# Patient Record
Sex: Female | Born: 1955 | Race: Black or African American | Hispanic: No | Smoking: Current every day smoker
Health system: Southern US, Community
[De-identification: ages and names within clinical notes are randomized; demographics above are authoritative.]

## PROBLEM LIST (undated history)

## (undated) DIAGNOSIS — F319 Bipolar disorder, unspecified: Secondary | ICD-10-CM

## (undated) DIAGNOSIS — N186 End stage renal disease: Secondary | ICD-10-CM

## (undated) DIAGNOSIS — I10 Essential (primary) hypertension: Secondary | ICD-10-CM

## (undated) DIAGNOSIS — E119 Type 2 diabetes mellitus without complications: Secondary | ICD-10-CM

## (undated) HISTORY — PX: TUBAL LIGATION: SHX77

---

## 2000-04-22 ENCOUNTER — Encounter: Payer: Self-pay | Admitting: Family Medicine

## 2000-04-22 ENCOUNTER — Encounter: Admission: RE | Admit: 2000-04-22 | Discharge: 2000-04-22 | Payer: Self-pay | Admitting: Family Medicine

## 2000-04-24 ENCOUNTER — Encounter: Payer: Self-pay | Admitting: Family Medicine

## 2000-04-24 ENCOUNTER — Encounter: Admission: RE | Admit: 2000-04-24 | Discharge: 2000-04-24 | Payer: Self-pay | Admitting: Family Medicine

## 2000-04-30 ENCOUNTER — Encounter: Admission: RE | Admit: 2000-04-30 | Discharge: 2000-04-30 | Payer: Self-pay | Admitting: Family Medicine

## 2000-04-30 ENCOUNTER — Encounter: Payer: Self-pay | Admitting: Family Medicine

## 2004-11-26 ENCOUNTER — Emergency Department (HOSPITAL_COMMUNITY): Admission: EM | Admit: 2004-11-26 | Discharge: 2004-11-26 | Payer: Self-pay | Admitting: Emergency Medicine

## 2005-01-16 ENCOUNTER — Encounter: Admission: RE | Admit: 2005-01-16 | Discharge: 2005-01-16 | Payer: Self-pay | Admitting: Cardiology

## 2005-01-31 ENCOUNTER — Encounter: Admission: RE | Admit: 2005-01-31 | Discharge: 2005-01-31 | Payer: Self-pay | Admitting: Cardiology

## 2007-06-28 ENCOUNTER — Inpatient Hospital Stay (HOSPITAL_COMMUNITY): Admission: EM | Admit: 2007-06-28 | Discharge: 2007-07-03 | Payer: Self-pay | Admitting: Emergency Medicine

## 2007-06-30 ENCOUNTER — Encounter: Payer: Self-pay | Admitting: Internal Medicine

## 2007-06-30 ENCOUNTER — Ambulatory Visit: Payer: Self-pay | Admitting: Vascular Surgery

## 2011-02-20 NOTE — H&P (Signed)
NAMEJODA, Jane Higgins              ACCOUNT NO.:  [id number]   MEDICAL RECORD NO.:  [id number]          PATIENT TYPE:  EMS   LOCATION:  ED                           FACILITY:  [facility]   PHYSICIAN:  Eric L. August Saucer, M.D.     DATE OF BIRTH:  09/20/1956   DATE OF ADMISSION:  06/27/2007  DATE OF DISCHARGE:                              HISTORY & PHYSICAL   CHIEF COMPLAINT:  Increasing lower extremity edema, leg pain, fever.   HISTORY OF PRESENT ILLNESS:  First recent [facility] admission  for this 55 year old married black female patient of Dr. Shana Chute with at  least a 1-year history of hypertension.  Long history of EtOH abuse.  She states she had been doing fairly well until 4 days prior to  admission when she spontaneously began to have increasing swelling in  her right leg greater than left.  She does not recall any prolonged  activity.  No trauma to her legs.  She noted progressive swelling and  discomfort of the legs over the subsequent days.  She denies chest pains  or shortness of breath.  Today her legs became more painful and of  greater concern.  She subsequently came to the emergency room for  further evaluation and was admitted thereafter.   PAST MEDICAL HISTORY:  Past medical history is notable for hypertension  for greater than a year.  History of tobacco abuse, 1-1/2 packs a day  for greater than 20 years.  History of ETOH abuse where she drinks a 6-  pack of beer on average of once a month.   ALLERGIES:  The patient states she is only allergic to questionably aloe  vera which causes her to break out in a rash.   MOST RECENT MEDICATIONS:  1. Tekturna 150 mg daily.  2. Risperdal, dose unknown.   REVIEW OF SYSTEMS:  As noted above.   PAST MEDICAL HISTORY:  Past medical history otherwise notable for  migraine headaches in the past.  Kidney stones.  Of note record  documents CVA which patient denies.   SOCIAL HISTORY:  The patient lives with her husband.   PHYSICAL EXAM:  GENERAL:  On physical exam, she is a well-developed,  well-nourished black female in no acute distress.  VITAL SIGNS:  Initially revealed a blood pressure of 177/88, a pulse of  120, respiratory rate 20, temperature 97.9 with O2 sat of 97%.  HEENT:  No sinus tenderness.  TM's clear.  Throat, posterior pharynx  clear.  NECK:  Supple.  No enlarged thyroid.  No carotid bruits appreciated.  No  posterior cervical nodes.  LUNGS:  Notable for patchy E to A change in  the left base versus right.  No rub or wheezes appreciated.  No CVA  tenderness.  CARDIOVASCULAR EXAM:  Normal S1-S2.  No S3.  No murmurs or rubs  appreciated.  ABDOMEN:  Abdomen with mild distention.  Bowel sounds are present.  Dullness in the lower quadrants, nontender.  MUSCULOSKELETAL EXAM:  Notable for the lower extremities.  She does have  trace pitting edema in the left lower extremity.  Her right leg,  however, she has 2+ edema.  Mild increased warmth in the foot extending  up to the right knee as well.  Sensation is intact.  NEUROLOGICALLY:  She is alert and oriented x3.  Cranial nerves were  intact.  The patient appeared anxious at this time.  States she has not  had a cigarette for 8 hours.   LABORATORY DATA:  CBC revealed WBC 15,700, hemoglobin 11.5, hematocrit  of 34.5.  Platelets 470,000.  There were 89% neutrophils, __________  lymphocytes.  Increased eosinophils noted.  D-dimer was mildly elevated  at 1.21.  Electrolytes sodium 135, potassium 3.6, chloride 98, CO2 27,  BUN 11, creatinine of 1.17.  Calcium 9.1.  CK-MB less than 1.0.  Myoglobin 144.  BNP was 48.7.  Urinalysis unremarkable except for 3+  protein.   Chest x-ray, no active cardiopulmonary disease.  EKG notable for a mild  sinus tachycardia with a question of old septal infarct.   IMPRESSION:  1. Lower extremity edema with fever.  Rule out cellulitis though      somewhat atypical.  Rule out deep venous thrombosis as well.  2. Rule  out early nephrotic syndrome.  The patient has significant      proteinuria.  A serum albumin is still pending at this time.  3. Hypertension, presently not well controlled.  She acknowledges some      dietary indiscretions with salt recently.  4. Tobacco abuse, chronic in nature.  5. Mild leukocytosis.  Rule out secondary to associated infection or      above.  6. History of kidney stones.   PLAN:  The patient admitted to telemetry for close observation.  Will  place the patient on Lovenox to cover for possible DVT.  She will be  placed on Rocephin IV as well.  She will need a venous Doppler in the  a.m.  Follow up on her other additional laboratory data.  Elevation of  her legs.  Pending results of the above, consider low-dose diuretic as  well.  Further therapy pending response to above.           ______________________________  Lind Guest. August Saucer, M.D.     ELD/MEDQ  D:  06/27/2007  T:  06/29/2007  Job:  [id number]

## 2011-02-23 NOTE — Discharge Summary (Signed)
NAMEKAYLIN, MARCON              ACCOUNT NO.:  [id number]   MEDICAL RECORD NO.:  [id number]          PATIENT TYPE:  INP   LOCATION:  1430                         FACILITY:  [facility]   PHYSICIAN:  Osvaldo Shipper. Spruill, M.D.DATE OF BIRTH:  [birth year]   DATE OF ADMISSION:  06/27/2007  DATE OF DISCHARGE:  07/03/2007                               DISCHARGE SUMMARY   ADMISSION/PROVISIONAL DIAGNOSES:  1. Right leg cellulitis.  2. Proteinuria.  3. Monoclonal gammopathy.  4. Alcohol abuse.  5. Gout.   DISCHARGE DIAGNOSES:  1. Cellulitis of leg.  2. Pyrexia of unknown origin.  3. Hypertension.  4. Leukocytosis.  5. Anxiety state.  6. Obesity.  7. Tobacco use disorder.  8. Alcohol abuse, continuous.   BRIEF HISTORY AND REASON FOR ADMISSION:  This 55 year old black woman  was admitted to the hospital in my absence by Dr. Minerva Areola L. August Saucer on  June 27, 2007.  At the time, the patient was complaining of  increasing lower extremity edema, leg pain and fever.  The patient came  to the emergency room at [facility] with a long history of alcohol  abuse, as well, and stated she had been doing fairly well until 4 days  prior to admission when she began to have increased swelling in her  right leg greater than the left.  She does not describe any antecedent  trauma to her leg.  As the pain in her legs grew worse, she subsequently  came to the hospital for further evaluation.  She was seen by Dr. Willey Blade in the emergency room and subsequently admitted to the hospital for  intensive therapy.   LABORATORY STUDIES:  The patient had an noninvasive vascular lab  peripheral venous study which revealed no evidence of deep venous  thrombosis, no superficial thrombosis.   Electrocardiogram revealed sinus tachycardia, septal infarction of  undetermined age.   Laboratory studies revealed urinalysis on June 27, 2007 - color was  yellow, cloudy, specific gravity 1.012.  The patient had trace  hemoglobin, greater than 300 mg of protein in the urine, trace leukocyte  esterase.  Myoglobin was 144.  CK-MB was less than 1.   The patient also had B. natruretic peptide which was 40.7.  Lipid  profile revealed a cholesterol of 123, HDL was 41, triglycerides 73, LDL  cholesterol was 67.  The patient had a serum protein electrophoresis  that revealed albumin was low at 44.1.  Alpha I globulin was 10, which  was elevated.  Alpha II globulin was 18.6, elevated.  Beta globulin was  low at 4.5.  Gamma globin M spike was 14.9, normal.  The routine  chemistry revealed total protein of 6.1, albumin 2.4, AST of 31, ALT of  40, alkaline phosphatase of 79, total bilirubin of 0.5.  The glomerular  filtration rate was 57.   Serum sodium was 135, potassium 3.6, chloride 98, CO2 content 27,  glucose 140, BUN 11, creatinine 1.17.   White blood cell count on admission was 15,700, had decreased to 12,800  hear the time of discharge.  Hemoglobin 11.5 on admission, 11.2 at  discharge.  Platelet count 470,000 on admission, 607,000 at discharge.  D-dimer was 1.21.   HOSPITAL COURSE:  This 55 year old white woman was admitted to the  hospital.  She had baseline laboratory studies obtained which were given  in the body of the chart.  She was placed on telemetry by Dr. August Saucer, and  the diagnosis at the time was rule out DVT with cellulitis.   The patient's vital signs were obtained every 4 hours.  She was at  bedrest.  She was given Lovenox 40 mg subcutaneous daily per protocol.  She was also placed on Rocephin 1 gram IV daily, and diet was 4 gram  sodium restricted diet.  She was given a nicotine patch to help with her  smoking cessation.   The patient was continued on Tekturna 150 mg p.o. daily, Risperdal 1 mg  p.o. q.h.s., Xanax 0.25 mg as needed for anxiety.   She was started also on spironolactone 25 mg p.o. b.i.d., and a 24-hour  urine collection for protein and creatinine clearance was obtained  and  SPET for electrophoresis was also obtained at the time.   The patient began to show some improvement while on antibiotic with less  reddening of her lower extremity and eventually she was placed on p.o.  medications.  Other studies were obtained to evaluate the patient's  protein problem, and are given in the body of the chart.  She  essentially responded to IV antibiotics for her cellulitis and had no  was significant difficulty after this.   She was also placed on Avapro 300 mg per mouth daily, and her IV  Rocephin had been stopped at some point prior to being placed on Ceftin  500 mg twice a day.  She appeared to respond to this therapy.   The patient had improved with her cellulitis and no evidence of any  additional fever.  Her white blood cell count was decreasing, and it was  felt that she could be released home for further office follow-up in  approximately 1-2 weeks.   DISCHARGE MEDICATIONS:  1. Xanax 0.25 mg by mouth three times a day.  2. Tekturna 150 mg p.o. q.a.m.  3. Risperdal 1 mg q.h.s.  4. Aldactone 25 mg p.o. b.i.d.  5. Ceftin 500 mg p.o. b.i.d.  6. Avapro 300 mg by mouth daily.  7. She will be seen in the office for additional follow up.   DISCHARGE DIET:  A low-sodium diet indication.      Osvaldo Shipper. Spruill, M.D.  Electronically Signed     JOS/MEDQ  D:  08/27/2007  T:  08/28/2007  Job:  [id number]

## 2011-07-19 LAB — PROTEIN, URINE, 24 HOUR: Urine Total Volume-UPROT: 1800

## 2011-07-19 LAB — URINALYSIS, ROUTINE W REFLEX MICROSCOPIC
Specific Gravity, Urine: 1.012
Urobilinogen, UA: 0.2
pH: 7

## 2011-07-19 LAB — COMPREHENSIVE METABOLIC PANEL
AST: 106 — ABNORMAL HIGH
AST: 31
Albumin: 2.1 — ABNORMAL LOW
Albumin: 2.4 — ABNORMAL LOW
Alkaline Phosphatase: 94
BUN: 10
BUN: 7
Calcium: 9
Chloride: 104
Chloride: 99
Creatinine, Ser: 1.02
GFR calc Af Amer: >60
GFR calc Af Amer: >60
Potassium: 3.6
Sodium: 138
Total Protein: 5.5 — ABNORMAL LOW
Total Protein: 6.1

## 2011-07-19 LAB — DIFFERENTIAL
Basophils Absolute: 0
Basophils Relative: 1
Eosinophils Relative: 0
Eosinophils Relative: 1
Lymphocytes Relative: 13
Lymphocytes Relative: 6 — ABNORMAL LOW
Lymphs Abs: 0.9
Monocytes Absolute: 0.8 — ABNORMAL HIGH
Monocytes Absolute: 0.9 — ABNORMAL HIGH
Monocytes Relative: 8
Neutro Abs: 14 — ABNORMAL HIGH
Neutro Abs: 8.9 — ABNORMAL HIGH

## 2011-07-19 LAB — BASIC METABOLIC PANEL
BUN: 12
CO2: 28
Calcium: 9.1
GFR calc Af Amer: 59 — ABNORMAL LOW
GFR calc non Af Amer: 48 — ABNORMAL LOW
Glucose, Bld: 148 — ABNORMAL HIGH
Potassium: 3.6
Potassium: 4.3
Sodium: 135
Sodium: 137

## 2011-07-19 LAB — D-DIMER, QUANTITATIVE: D-Dimer, Quant: 1.21 — ABNORMAL HIGH

## 2011-07-19 LAB — CREATININE CLEARANCE, URINE, 24 HOUR
Collection Interval-CRCL: 24
Creatinine Clearance: 75
Creatinine, 24H Ur: 1100
Creatinine: 1.02
Urine Total Volume-CRCL: 1800

## 2011-07-19 LAB — CBC
HCT: 29.9 — ABNORMAL LOW
HCT: 33.5 — ABNORMAL LOW
HCT: 34.5 — ABNORMAL LOW
Hemoglobin: 11.2 — ABNORMAL LOW
Hemoglobin: 11.5 — ABNORMAL LOW
MCHC: 33.4
Platelets: 446 — ABNORMAL HIGH
RDW: 14.6 — ABNORMAL HIGH
RDW: 14.9 — ABNORMAL HIGH
WBC: 11.7 — ABNORMAL HIGH
WBC: 15.7 — ABNORMAL HIGH

## 2011-07-19 LAB — URINE MICROSCOPIC-ADD ON

## 2011-07-19 LAB — PROTEIN ELECTROPH W RFLX QUANT IMMUNOGLOBULINS
Albumin ELP: 44.1 — ABNORMAL LOW
Beta 2: 7.9 — ABNORMAL HIGH
Beta Globulin: 4.5 — ABNORMAL LOW
M-Spike, %: NOT DETECTED

## 2011-07-19 LAB — POCT CARDIAC MARKERS
CKMB, poc: <1 — ABNORMAL LOW
Myoglobin, poc: 144
Troponin i, poc: <0.05

## 2011-07-19 LAB — LIPID PANEL
HDL: 41
LDL Cholesterol: 67
Total CHOL/HDL Ratio: 3
Triglycerides: 73
VLDL: 15

## 2011-07-19 LAB — PROTEIN, TOTAL: Total Protein: 6.4

## 2011-07-19 LAB — B-NATRIURETIC PEPTIDE (CONVERTED LAB): Pro B Natriuretic peptide (BNP): 48.7

## 2013-04-12 ENCOUNTER — Encounter (HOSPITAL_COMMUNITY): Payer: Self-pay | Admitting: Emergency Medicine

## 2013-04-12 ENCOUNTER — Inpatient Hospital Stay (HOSPITAL_COMMUNITY)
Admission: EM | Admit: 2013-04-12 | Discharge: 2013-04-16 | DRG: 100 | Disposition: A | Discharge status: Home Health | Payer: Medicare Other | Attending: Internal Medicine | Admitting: Internal Medicine

## 2013-04-12 ENCOUNTER — Emergency Department (HOSPITAL_COMMUNITY): Payer: Medicare Other

## 2013-04-12 DIAGNOSIS — E119 Type 2 diabetes mellitus without complications: Secondary | ICD-10-CM | POA: Diagnosis present

## 2013-04-12 DIAGNOSIS — F203 Undifferentiated schizophrenia: Secondary | ICD-10-CM

## 2013-04-12 DIAGNOSIS — I160 Hypertensive urgency: Secondary | ICD-10-CM | POA: Diagnosis present

## 2013-04-12 DIAGNOSIS — E876 Hypokalemia: Secondary | ICD-10-CM | POA: Diagnosis present

## 2013-04-12 DIAGNOSIS — R739 Hyperglycemia, unspecified: Secondary | ICD-10-CM | POA: Diagnosis present

## 2013-04-12 DIAGNOSIS — G40401 Other generalized epilepsy and epileptic syndromes, not intractable, with status epilepticus: Principal | ICD-10-CM | POA: Diagnosis present

## 2013-04-12 DIAGNOSIS — F319 Bipolar disorder, unspecified: Secondary | ICD-10-CM | POA: Diagnosis present

## 2013-04-12 DIAGNOSIS — I169 Hypertensive crisis, unspecified: Secondary | ICD-10-CM

## 2013-04-12 DIAGNOSIS — J9600 Acute respiratory failure, unspecified whether with hypoxia or hypercapnia: Secondary | ICD-10-CM | POA: Diagnosis present

## 2013-04-12 DIAGNOSIS — R7309 Other abnormal glucose: Secondary | ICD-10-CM

## 2013-04-12 DIAGNOSIS — G40901 Epilepsy, unspecified, not intractable, with status epilepticus: Secondary | ICD-10-CM

## 2013-04-12 DIAGNOSIS — R569 Unspecified convulsions: Secondary | ICD-10-CM | POA: Diagnosis present

## 2013-04-12 DIAGNOSIS — Z91199 Patient's noncompliance with other medical treatment and regimen due to unspecified reason: Secondary | ICD-10-CM

## 2013-04-12 DIAGNOSIS — N179 Acute kidney failure, unspecified: Secondary | ICD-10-CM | POA: Diagnosis present

## 2013-04-12 DIAGNOSIS — R1110 Vomiting, unspecified: Secondary | ICD-10-CM | POA: Diagnosis present

## 2013-04-12 DIAGNOSIS — D696 Thrombocytopenia, unspecified: Secondary | ICD-10-CM | POA: Diagnosis present

## 2013-04-12 DIAGNOSIS — E1101 Type 2 diabetes mellitus with hyperosmolarity with coma: Secondary | ICD-10-CM

## 2013-04-12 DIAGNOSIS — F209 Schizophrenia, unspecified: Secondary | ICD-10-CM | POA: Diagnosis present

## 2013-04-12 DIAGNOSIS — I674 Hypertensive encephalopathy: Secondary | ICD-10-CM | POA: Diagnosis present

## 2013-04-12 DIAGNOSIS — E870 Hyperosmolality and hypernatremia: Secondary | ICD-10-CM | POA: Diagnosis present

## 2013-04-12 DIAGNOSIS — Z9119 Patient's noncompliance with other medical treatment and regimen: Secondary | ICD-10-CM

## 2013-04-12 DIAGNOSIS — F17200 Nicotine dependence, unspecified, uncomplicated: Secondary | ICD-10-CM | POA: Diagnosis present

## 2013-04-12 DIAGNOSIS — I10 Essential (primary) hypertension: Secondary | ICD-10-CM | POA: Diagnosis present

## 2013-04-12 HISTORY — DX: Bipolar disorder, unspecified: F31.9

## 2013-04-12 LAB — COMPREHENSIVE METABOLIC PANEL
Albumin: 3.5 g/dL (ref 3.5–5.2)
BUN: 20 mg/dL (ref 6–23)
Chloride: 95 mEq/L — ABNORMAL LOW (ref 96–112)
Creatinine, Ser: 1.66 mg/dL — ABNORMAL HIGH (ref 0.50–1.10)
Total Bilirubin: 0.3 mg/dL (ref 0.3–1.2)

## 2013-04-12 LAB — GLUCOSE, CAPILLARY: Glucose-Capillary: 579 mg/dL (ref 70–99)

## 2013-04-12 LAB — CBC
HCT: 54.6 % — ABNORMAL HIGH (ref 36.0–46.0)
MCH: 26 pg (ref 26.0–34.0)
MCHC: 33.2 g/dL (ref 30.0–36.0)
MCV: 78.6 fL (ref 78.0–100.0)
RDW: 14.8 % (ref 11.5–15.5)

## 2013-04-12 LAB — ETHANOL: Alcohol, Ethyl (B): <11 mg/dL (ref 0–11)

## 2013-04-12 LAB — POCT I-STAT 3, ART BLOOD GAS (G3+)
Acid-base deficit: 2 mmol/L (ref 0.0–2.0)
Bicarbonate: 23.3 mEq/L (ref 20.0–24.0)
Patient temperature: 98.7
TCO2: 25 mmol/L (ref 0–100)
pO2, Arterial: 333 mmHg — ABNORMAL HIGH (ref 80.0–100.0)

## 2013-04-12 LAB — URINALYSIS, ROUTINE W REFLEX MICROSCOPIC
Glucose, UA: >1000 mg/dL — AB
Specific Gravity, Urine: 1.02 (ref 1.005–1.030)
pH: 6.5 (ref 5.0–8.0)

## 2013-04-12 LAB — POCT I-STAT TROPONIN I: Troponin i, poc: 0 ng/mL (ref 0.00–0.08)

## 2013-04-12 LAB — DIFFERENTIAL
Basophils Absolute: 0.1 10*3/uL (ref 0.0–0.1)
Eosinophils Absolute: 0.1 10*3/uL (ref 0.0–0.7)
Lymphs Abs: 1.8 10*3/uL (ref 0.7–4.0)
Monocytes Relative: 5 % (ref 3–12)
Neutro Abs: 3.6 10*3/uL (ref 1.7–7.7)

## 2013-04-12 LAB — PROTIME-INR: Prothrombin Time: 12.9 seconds (ref 11.6–15.2)

## 2013-04-12 LAB — POCT I-STAT, CHEM 8
HCT: 40 % (ref 36.0–46.0)
Hemoglobin: 13.6 g/dL (ref 12.0–15.0)
Potassium: 3.4 mEq/L — ABNORMAL LOW (ref 3.5–5.1)
Sodium: 132 mEq/L — ABNORMAL LOW (ref 135–145)
TCO2: 25 mmol/L (ref 0–100)

## 2013-04-12 LAB — RAPID URINE DRUG SCREEN, HOSP PERFORMED
Amphetamines: NOT DETECTED
Opiates: NOT DETECTED

## 2013-04-12 LAB — URINE MICROSCOPIC-ADD ON

## 2013-04-12 LAB — TROPONIN I: Troponin I: <0.3 ng/mL (ref <0.30)

## 2013-04-12 LAB — APTT: aPTT: 26 seconds (ref 24–37)

## 2013-04-12 MED ORDER — INSULIN REGULAR HUMAN 100 UNIT/ML IJ SOLN
INTRAMUSCULAR | Status: DC
  Administered 2013-04-12: 9.5 [IU]/h via INTRAVENOUS
  Administered 2013-04-12: 5.4 [IU]/h via INTRAVENOUS
  Administered 2013-04-12: 14.4 [IU]/h via INTRAVENOUS
  Filled 2013-04-12 (×2): qty 1

## 2013-04-12 MED ORDER — LORAZEPAM 2 MG/ML IJ SOLN
1.0000 mg | INTRAMUSCULAR | Status: DC | PRN
  Filled 2013-04-12: qty 1

## 2013-04-12 MED ORDER — SODIUM CHLORIDE 0.9 % IV SOLN
3.0000 g | Freq: Four times a day (QID) | INTRAVENOUS | Status: DC
  Administered 2013-04-12 – 2013-04-13 (×3): 3 g via INTRAVENOUS
  Filled 2013-04-12 (×5): qty 3

## 2013-04-12 MED ORDER — ROCURONIUM BROMIDE 50 MG/5ML IV SOLN
INTRAVENOUS | Status: AC
  Filled 2013-04-12: qty 2

## 2013-04-12 MED ORDER — LIDOCAINE HCL (CARDIAC) 20 MG/ML IV SOLN
INTRAVENOUS | Status: AC
  Filled 2013-04-12: qty 5

## 2013-04-12 MED ORDER — PROPOFOL 10 MG/ML IV EMUL
5.0000 ug/kg/min | Freq: Once | INTRAVENOUS | Status: AC
  Administered 2013-04-12: 40 ug/kg/min via INTRAVENOUS

## 2013-04-12 MED ORDER — NICARDIPINE HCL IN NACL 20-0.86 MG/200ML-% IV SOLN: 5.0000 mg/h | Freq: Once | INTRAVENOUS | Status: DC

## 2013-04-12 MED ORDER — SODIUM CHLORIDE 0.9 % IV SOLN: INTRAVENOUS | Status: DC

## 2013-04-12 MED ORDER — SODIUM CHLORIDE 0.9 % IV SOLN: 75.0000 mL/h | INTRAVENOUS | Status: DC

## 2013-04-12 MED ORDER — SODIUM CHLORIDE 0.9 % IV SOLN
INTRAVENOUS | Status: DC
  Administered 2013-04-12: 20:00:00 via INTRAVENOUS

## 2013-04-12 MED ORDER — LABETALOL HCL 5 MG/ML IV SOLN
INTRAVENOUS | Status: AC
  Administered 2013-04-12: 20 mg
  Filled 2013-04-12: qty 4

## 2013-04-12 MED ORDER — SODIUM CHLORIDE 0.9 % IV BOLUS (SEPSIS)
1000.0000 mL | Freq: Once | INTRAVENOUS | Status: AC
  Administered 2013-04-12: 1000 mL via INTRAVENOUS

## 2013-04-12 MED ORDER — SODIUM CHLORIDE 0.9 % IV SOLN: 250.0000 mL | INTRAVENOUS | Status: DC | PRN

## 2013-04-12 MED ORDER — CHLORHEXIDINE GLUCONATE 0.12 % MT SOLN
OROMUCOSAL | Status: AC
  Administered 2013-04-12: 15 mL
  Filled 2013-04-12: qty 15

## 2013-04-12 MED ORDER — SODIUM CHLORIDE 0.9 % IV SOLN
1000.0000 mg | Freq: Once | INTRAVENOUS | Status: AC
  Administered 2013-04-12: 1000 mg via INTRAVENOUS
  Filled 2013-04-12: qty 10

## 2013-04-12 MED ORDER — LEVETIRACETAM 500 MG/5ML IV SOLN
500.0000 mg | Freq: Two times a day (BID) | INTRAVENOUS | Status: DC
  Administered 2013-04-12 – 2013-04-15 (×6): 500 mg via INTRAVENOUS
  Filled 2013-04-12 (×9): qty 5

## 2013-04-12 MED ORDER — PROPOFOL 10 MG/ML IV EMUL
INTRAVENOUS | Status: AC
  Administered 2013-04-12: 1000 mg
  Filled 2013-04-12: qty 100

## 2013-04-12 MED ORDER — SUCCINYLCHOLINE CHLORIDE 20 MG/ML IJ SOLN
INTRAMUSCULAR | Status: AC
  Administered 2013-04-12: 120 mg
  Filled 2013-04-12: qty 1

## 2013-04-12 MED ORDER — ETOMIDATE 2 MG/ML IV SOLN
INTRAVENOUS | Status: AC
  Administered 2013-04-12: 30 mg
  Filled 2013-04-12: qty 20

## 2013-04-12 MED ORDER — POTASSIUM CHLORIDE 10 MEQ/100ML IV SOLN
10.0000 meq | Freq: Once | INTRAVENOUS | Status: AC
  Administered 2013-04-12: 10 meq via INTRAVENOUS
  Filled 2013-04-12: qty 100

## 2013-04-12 MED ORDER — VITAL AF 1.2 CAL PO LIQD
1000.0000 mL | ORAL | Status: DC
  Filled 2013-04-12 (×3): qty 1000

## 2013-04-12 MED ORDER — PANTOPRAZOLE SODIUM 40 MG IV SOLR
40.0000 mg | Freq: Every day | INTRAVENOUS | Status: DC
  Administered 2013-04-12: 40 mg via INTRAVENOUS
  Filled 2013-04-12 (×2): qty 40

## 2013-04-12 MED ORDER — PROPOFOL 10 MG/ML IV EMUL
5.0000 ug/kg/min | INTRAVENOUS | Status: DC
  Administered 2013-04-12: 50 ug/kg/min via INTRAVENOUS
  Administered 2013-04-13: 40 ug/kg/min via INTRAVENOUS
  Filled 2013-04-12 (×3): qty 100

## 2013-04-12 MED ORDER — PNEUMOCOCCAL VAC POLYVALENT 25 MCG/0.5ML IJ INJ
0.5000 mL | INJECTION | INTRAMUSCULAR | Status: AC
  Administered 2013-04-13: 0.5 mL via INTRAMUSCULAR
  Filled 2013-04-12: qty 0.5

## 2013-04-12 MED ORDER — NICARDIPINE HCL IN NACL 20-0.86 MG/200ML-% IV SOLN
5.0000 mg/h | Freq: Once | INTRAVENOUS | Status: AC
  Administered 2013-04-12: 5 mg/h via INTRAVENOUS

## 2013-04-12 MED ORDER — SODIUM CHLORIDE 0.9 % IV BOLUS (SEPSIS)
2000.0000 mL | Freq: Once | INTRAVENOUS | Status: AC
  Administered 2013-04-12: 2000 mL via INTRAVENOUS

## 2013-04-12 NOTE — Consult Note (Signed)
NEURO HOSPITALIST CONSULT NOTE    Reason for Consult: New-onset generalized seizures.  HPI:                                                                                                                                          Jane Higgins is an 57 y.o. female with a history of bipolar affective disorder who was brought to the emergency room with generalized seizure activity. Patient had generalized seizure activity in route to the hospital as well as after arriving in the emergency room. She was last seen well at 1:30 today. She was noted by family members to lose consciousness and exhibited generalized seizure activity. Eyes were deviated to the left side. There is no previous history of seizure activity. Blood pressure was markedly elevated at 250 01/06/1944. She was given labetalol 20 mg IV but subsequently required Cardene drip for blood pressure control. She was intubated in the emergency room for airway protection and sedated with propofol drip. She was also given 1 g of Keppra IV for seizure prevention. CT scan of her head showed no acute intracranial abnormality. I-STAT blood sugar was greater than 700. Potassium was slightly low at 3.4 and serum sodium was 132. She was started on intravenous fluids and potassium replacement prior to being given IV insulin. She has no documented history of hypertension nor diabetes mellitus. Family members indicate less than ideal compliance with taking medication for bipolar disorder.  Past Medical History  Diagnosis Date  . Bipolar 1 disorder     History reviewed. No pertinent past surgical history.  Family history: Positive for diabetes mellitus involving several brothers.   Social History: Smokes at least one pack of cigarettes per day.  No Known Allergies  MEDICATIONS:                                                                                                                     I have reviewed the patient's  current medications. Prior to Admission:  Unavailable.  ROS:  History obtained from child.  General ROS: negative for - chills, fatigue, fever, night sweats, weight gain or weight loss Psychological ROS: negative for - behavioral disorder, hallucinations, memory difficulties, mood swings or suicidal ideation Ophthalmic ROS: negative for - blurry vision, double vision, eye pain or loss of vision ENT ROS: negative for - epistaxis, nasal discharge, oral lesions, sore throat, tinnitus or vertigo Allergy and Immunology ROS: negative for - hives or itchy/watery eyes Hematological and Lymphatic ROS: negative for - bleeding problems, bruising or swollen lymph nodes Endocrine ROS: negative for - galactorrhea, hair pattern changes, polydipsia/polyuria or temperature intolerance Respiratory ROS: negative for - cough, hemoptysis, shortness of breath or wheezing Cardiovascular ROS: negative for - chest pain, dyspnea on exertion, edema or irregular heartbeat Gastrointestinal ROS: negative for - abdominal pain, diarrhea, hematemesis, nausea/vomiting or stool incontinence Genito-Urinary ROS: Positive for urinary frequency/urgency Musculoskeletal ROS: negative for - joint swelling or muscular weakness Neurological ROS: as noted in HPI Dermatological ROS: negative for rash and skin lesion changes   Blood pressure 126/108, pulse 79, temperature 97.2 F (36.2 C), resp. rate 16, height 5\' 6"  (1.676 m), weight 66.999 kg (147 lb 11.3 oz), SpO2 100.00%.   Neurologic Examination:                                                                                                      Patient was intubated and on mechanical ventilation. She had spontaneous respirations as well. She was sedated with propofol. Pupils were equal and reacted normally to light. Extraocular movements were  intact with oculocephalic maneuvers. She had no gaze preference. Face was symmetrical with no indication of focal weakness. Patient moved extremities equally in response to noxious stimuli. Muscle tone was normal throughout. Deep tendon reflexes were trace to 1+ and symmetrical. Plantar responses were flexor bilaterally.  Lab Results  Component Value Date/Time   CHOL  Value: 123        ATP III CLASSIFICATION:  <200     mg/dL   Desirable  562-130  mg/dL   Borderline High  >=865    mg/dL   High 7/84/6962  9:52 AM    Results for orders placed during the hospital encounter of 04/12/13 (from the past 48 hour(s))  PROTIME-INR     Status: None   Collection Time    04/12/13  2:41 PM      Result Value Range   Prothrombin Time 12.7  11.6 - 15.2 seconds   INR 0.97  0.00 - 1.49  APTT     Status: None   Collection Time    04/12/13  2:41 PM      Result Value Range   aPTT 28  24 - 37 seconds  CBC     Status: Abnormal   Collection Time    04/12/13  2:41 PM      Result Value Range   WBC 5.9  4.0 - 10.5 K/uL   RBC 6.95 (*) 3.87 - 5.11 MIL/uL   Hemoglobin 18.1 (*) 12.0 - 15.0 g/dL   HCT 84.1 (*) 32.4 - 40.1 %  MCV 78.6  78.0 - 100.0 fL   MCH 26.0  26.0 - 34.0 pg   MCHC 33.2  30.0 - 36.0 g/dL   RDW 78.4  69.6 - 29.5 %   Platelets 74 (*) 150 - 400 K/uL   Comment: PLATELET COUNT CONFIRMED BY SMEAR  DIFFERENTIAL     Status: None   Collection Time    04/12/13  2:41 PM      Result Value Range   Neutrophils Relative % 62  43 - 77 %   Lymphocytes Relative 31  12 - 46 %   Monocytes Relative 5  3 - 12 %   Eosinophils Relative 1  0 - 5 %   Basophils Relative 1  0 - 1 %   Neutro Abs 3.6  1.7 - 7.7 K/uL   Lymphs Abs 1.8  0.7 - 4.0 K/uL   Monocytes Absolute 0.3  0.1 - 1.0 K/uL   Eosinophils Absolute 0.1  0.0 - 0.7 K/uL   Basophils Absolute 0.1  0.0 - 0.1 K/uL   RBC Morphology ELLIPTOCYTES    POCT I-STAT TROPONIN I     Status: None   Collection Time    04/12/13  2:57 PM      Result Value Range    Troponin i, poc 0.00  0.00 - 0.08 ng/mL   Comment 3            Comment: Due to the release kinetics of cTnI,     a negative result within the first hours     of the onset of symptoms does not rule out     myocardial infarction with certainty.     If myocardial infarction is still suspected,     repeat the test at appropriate intervals.  POCT I-STAT, CHEM 8     Status: Abnormal   Collection Time    04/12/13  2:59 PM      Result Value Range   Sodium 132 (*) 135 - 145 mEq/L   Potassium 3.4 (*) 3.5 - 5.1 mEq/L   Chloride 97  96 - 112 mEq/L   BUN 24 (*) 6 - 23 mg/dL   Creatinine, Ser 2.84 (*) 0.50 - 1.10 mg/dL   Glucose, Bld >132 (*) 70 - 99 mg/dL   Calcium, Ion 4.40 (*) 1.12 - 1.23 mmol/L   TCO2 25  0 - 100 mmol/L   Hemoglobin 13.6  12.0 - 15.0 g/dL   HCT 10.2  72.5 - 36.6 %   Comment NOTIFIED PHYSICIAN    GLUCOSE, CAPILLARY     Status: Abnormal   Collection Time    04/12/13  3:13 PM      Result Value Range   Glucose-Capillary >600 (*) 70 - 99 mg/dL   Comment 1 Documented in Chart     Comment 2 Notify RN      Ct Head Wo Contrast  04/12/2013   *RADIOLOGY REPORT*  Clinical Data: Status epilepticus, code stroke  CT HEAD WITHOUT CONTRAST  Technique:  Contiguous axial images were obtained from the base of the skull through the vertex without contrast.  Comparison: None.  Findings: No acute intracranial hemorrhage, acute infarction, mass lesion, mass effect, midline shift or hydrocephalus.  Gray-white differentiation is preserved throughout.  The globes are intact bilaterally.  Symmetric and unremarkable orbits.  No focal soft tissue abnormality.  Normal aeration of the mastoid air cells and visualized paranasal sinuses.  No focal calvarial abnormality.  IMPRESSION:  Negative head CT.  These results were  called by telephone on 04/12/2013 at 03:04 p.m. to Dr. Roseanne Reno, who verbally acknowledged these results.   Original Report Authenticated By: Malachy Moan, M.D.   Dg Chest Port 1  View  04/12/2013   *RADIOLOGY REPORT*  Clinical Data: Status epilepticus.  Code stroke. New intubation.  PORTABLE CHEST - 1 VIEW  Comparison: Chest x-ray dated 06/27/2007  Findings: Endotracheal tube is at the thoracic inlet in good position.  NG tube has been inserted and the tip is at the gastroesophageal junction and needs to be advanced.  Heart size and vascularity are normal and the lungs are clear.  No osseous abnormality.  IMPRESSION:  1.  Endotracheal tube in good position. 2.  NG tube is at the gastroesophageal junction and needs to be advanced. 3.  No acute abnormalities in the chest.   Original Report Authenticated By: Francene Boyers, M.D.   Assessment/Plan: New-onset generalized seizures most likely secondary to combination of hypertensive urgency with hypertensive encephalopathy, as well as hyperglycemia and hyperosmolar state. Patient has no clinical signs of acute stroke. Acute stroke cannot be completely ruled out, however.   Recommendations: 1. Management of hypertension and hyperglycemia acutely in intensive care unit setting. Recommend reducing systolic blood pressure down to 120-160 systolic. 2. Keppra 500 mg every 12 hours for anticonvulsant maintenance. 3. MRI of the brain without contrast to rule out acute stroke, as well as PRES. 4. Antibiotic coverage for possible aspiration pneumonia.  We will continue to follow this patient closely with CCM team.  Venetia Maxon M.D. [facility] [phone]  04/12/2013, 3:40 PM

## 2013-04-12 NOTE — ED Notes (Signed)
CBG: Hi  

## 2013-04-12 NOTE — Progress Notes (Signed)
ANTIBIOTIC CONSULT NOTE - INITIAL  Pharmacy Consult for unasyn Indication: empiric for aspiration PNA  No Known Allergies  Patient Measurements: Height: 5\' 6"  (167.6 cm) Weight: 147 lb 11.3 oz (66.999 kg) IBW/kg (Calculated) : 59.3  Vital Signs: Temp: 96.6 F (35.9 C) (07/06 1715) BP: 155/89 mmHg (07/06 1715) Pulse Rate: 80 (07/06 1715) Intake/Output from previous day:   Intake/Output from this shift:    Labs:  Recent Labs  04/12/13 1441 04/12/13 1459  WBC 5.9  --   HGB 18.1* 13.6  PLT 74*  --   CREATININE 1.66* 1.60*   Estimated Creatinine Clearance: 35.4 ml/min (by C-G formula based on Cr of 1.6). No results found for this basename: VANCOTROUGH, VANCOPEAK, VANCORANDOM, GENTTROUGH, GENTPEAK, GENTRANDOM, TOBRATROUGH, TOBRAPEAK, TOBRARND, AMIKACINPEAK, AMIKACINTROU, AMIKACIN,  in the last 72 hours   Microbiology: No results found for this or any previous visit (from the past 720 hour(s)).  Medical History: Past Medical History  Diagnosis Date  . Bipolar 1 disorder    Assessment: Patient is a 57 y.o F presented to the ED with new onset seizure and now on Keppra.  To start unasyn for empiric coverage for possible  Aspiration PNA.     Plan:  1) unasyn 3gm IV q6h 2) No drug-drug intxn noted at this time with keppra.  Mariaisabel Bodiford P 04/12/2013,5:38 PM

## 2013-04-12 NOTE — ED Provider Notes (Signed)
History    CSN: [id number] Arrival date & time 04/12/13  1421  First MD Initiated Contact with Patient 04/12/13 1446     Chief Complaint  Patient presents with  . Seizures   (Consider location/radiation/quality/duration/timing/severity/associated sxs/prior Treatment) HPI This 57 year old female has history of bipolar disorder without other known medical problems according to family was last known well at 1:30 this afternoon when she went into the bathroom, approximately 20-30 minutes later she came out of the bathroom appeared weak and sat down or collapsed on a chair and began having seizure activity which continued until arrival to the emergency department, apparently her seizure activity was generalized prior to arrival however by the time she arrived to the ED she only had left facial twitching with head deviated to the left with eyes deviated somewhat towards the left with blood sugar 99 for EMS, by the time the patient arrived to the ED she was otherwise flaccid unresponsive drooling and hypertensive with blood pressure approximately 250/150 for EMS, or seizure-like activity have been constant for over half an hour prior to arrival. History of present illness otherwise unobtainable due to the patient's unresponsive condition with altered mental status. Past Medical History  Diagnosis Date  . Bipolar 1 disorder    History reviewed. No pertinent past surgical history. History reviewed. No pertinent family history. History  Substance Use Topics  . Smoking status: Current Every Day Smoker -- 1.00 packs/day    Types: Cigarettes  . Smokeless tobacco: Not on file  . Alcohol Use: Yes     Comment: occasional   OB History   Grav Para Term Preterm Abortions TAB SAB Ect Mult Living                 Review of Systems  Unable to perform ROS: Mental status change    Allergies  Review of patient's allergies indicates no known allergies.  Home Medications   No current outpatient  prescriptions on file. BP 145/84  Pulse 76  Temp(Src) 97.7 F (36.5 C) (Oral)  Resp 19  Ht 5\' 6"  (1.676 m)  Wt 147 lb 11.3 oz (66.999 kg)  BMI 23.85 kg/m2  SpO2 100% Physical Exam  Nursing note and vitals reviewed. Constitutional:  GCS 3; her eyes are closed, she is flaccid in all 4 extremities unresponsive to painful stimulus, she has left-sided facial twitching suggestive of seizure activity, her head is deviated towards the left, she is nonverbal drooling and not protecting her airway upon arrival  HENT:  Head: Atraumatic.  Patient's mouth is somewhat clenched but has oral and gastric secretions upon arrival is suspicious for aspiration prior to arrival with drooling and absent gag reflex  Eyes: Right eye exhibits no discharge. Left eye exhibits no discharge.  Pupils are constricted equal 2-3 mm bilaterally and non-reactive; eyes mostly deviated towards the left but occasionally move towards midline with conjugate movement  Neck: Neck supple.  Cardiovascular: Normal rate and regular rhythm.   No murmur heard. Pulmonary/Chest: Effort normal. No respiratory distress. She has no wheezes. She has no rales. She exhibits no tenderness.  Bilateral diffuse rhonchi  Abdominal: Soft. She exhibits no distension and no mass. There is no tenderness. There is no rebound and no guarding.  Bowel sounds absent  Musculoskeletal: She exhibits no edema and no tenderness.  Flaccid all 4 extremities unresponsive to painful stimulus.  Neurological:  GCS 3; flaccid all 4 extremities unresponsive to painful stimulus, pupils constricted nonreactive cannot assess extraocular movements other than eyes deviated  towards the left, presents with facial asymmetry with left-sided facial tonic-clonic twitching suggestive of ongoing seizure activity, absent gag reflex  Skin: No rash noted.  Psychiatric: She has a normal mood and affect.    ED Course  Procedures (including critical care time) ECG: Sinus rhythm,  ventricular rate 74, borderline AV conduction delay with PR interval 200 ms, nonspecific intraventricular conduction delay with QRS duration 132 ms, left ventricular hypertrophy, nonspecific ST and T wave changes anterolaterally this, compared to September 2008 PR interval has increased, QRS duration is increased, nonspecific ST and T wave changes now present  Rapid sequence intubation- patient oriented suspected aspiration prior to arrival, emergency rapid sequence intubation performed after timeout taken, pulse oximetry 100% prior to procedure during procedure and post procedure, indications airway protection given clinically obvious airway compromise with lack of gag reflex and comatose patient with GCS 3, oropharynx suctioned of gastric contents prior to intubation, Glidescope used, size 7 endotracheal tube, one attempt, intubated to depth of 22 cm at gums, positive end tidal CO2 obtained, pulse oximetry remained 100%, equal bilateral rhonchi breath sounds bilaterally after intubation, absent breath sounds over the epigastrium, patient tolerated procedure well with no apparent immediate complications  Patient's family in the ED upon arrival with EMS And patient's family present during intubation, code stroke activated upon patient arrival, the patient's airway had to be protected with rapid sequence intubation prior to head CT.  CRITICAL CARE Performed by: Hurman Horn Total critical care time: including multiple conversations with EMS, family, neurology, and critical care medicine as well as titration of blood pressure with Cardene, seizure control with propofol drip and Keppra, as well as IV fluids and insulin drip for hyperglycemia. Critical care time was exclusive of separately billable procedures and treating other patients. Critical care was necessary to treat or prevent imminent or life-threatening deterioration. Critical care was time spent personally by me on the following activities:  development of treatment plan with patient and/or surrogate as well as nursing, discussions with consultants, evaluation of patient's response to treatment, examination of patient, obtaining history from patient or surrogate, ordering and performing treatments and interventions, ordering and review of laboratory studies, ordering and review of radiographic studies, pulse oximetry and re-evaluation of patient's condition.  Family / Caregiver understand and agree with initial ED impression and plan with expectations set for ED visit.Family / Caregiver informed of clinical course, understand medical decision-making process, and agree with plan. Labs Reviewed  CBC - Abnormal; Notable for the following:    RBC 6.95 (*)    Hemoglobin 18.1 (*)    HCT 54.6 (*)    Platelets 74 (*)    All other components within normal limits  COMPREHENSIVE METABOLIC PANEL - Abnormal; Notable for the following:    Sodium 127 (*)    Potassium 3.4 (*)    Chloride 95 (*)    Glucose, Bld 1019 (*)    Creatinine, Ser 1.66 (*)    Alkaline Phosphatase 140 (*)    GFR calc non Af Amer 33 (*)    GFR calc Af Amer 38 (*)    All other components within normal limits  URINE RAPID DRUG SCREEN (HOSP PERFORMED) - Abnormal; Notable for the following:    Benzodiazepines POSITIVE (*)    All other components within normal limits  URINALYSIS, ROUTINE W REFLEX MICROSCOPIC - Abnormal; Notable for the following:    Glucose, UA >1000 (*)    Hgb urine dipstick SMALL (*)    Protein, ur 100 (*)  All other components within normal limits  GLUCOSE, CAPILLARY - Abnormal; Notable for the following:    Glucose-Capillary >600 (*)    All other components within normal limits  GLUCOSE, CAPILLARY - Abnormal; Notable for the following:    Glucose-Capillary 579 (*)    All other components within normal limits  POCT I-STAT, CHEM 8 - Abnormal; Notable for the following:    Sodium 132 (*)    Potassium 3.4 (*)    BUN 24 (*)    Creatinine, Ser 1.60  (*)    Glucose, Bld >700 (*)    Calcium, Ion 1.24 (*)    All other components within normal limits  POCT I-STAT 3, BLOOD GAS (G3+) - Abnormal; Notable for the following:    pO2, Arterial 333.0 (*)    All other components within normal limits  MRSA PCR SCREENING  CULTURE, BLOOD (ROUTINE X 2)  CULTURE, BLOOD (ROUTINE X 2)  ETHANOL  PROTIME-INR  APTT  DIFFERENTIAL  TROPONIN I  URINE MICROSCOPIC-ADD ON  APTT  PROTIME-INR  TROPONIN I  OSMOLALITY  HIV ANTIBODY (ROUTINE TESTING)  POCT I-STAT TROPONIN I   Ct Head Wo Contrast  04/12/2013   *RADIOLOGY REPORT*  Clinical Data: Status epilepticus, code stroke  CT HEAD WITHOUT CONTRAST  Technique:  Contiguous axial images were obtained from the base of the skull through the vertex without contrast.  Comparison: None.  Findings: No acute intracranial hemorrhage, acute infarction, mass lesion, mass effect, midline shift or hydrocephalus.  Gray-white differentiation is preserved throughout.  The globes are intact bilaterally.  Symmetric and unremarkable orbits.  No focal soft tissue abnormality.  Normal aeration of the mastoid air cells and visualized paranasal sinuses.  No focal calvarial abnormality.  IMPRESSION:  Negative head CT.  These results were called by telephone on 04/12/2013 at 03:04 p.m. to Dr. Roseanne Reno, who verbally acknowledged these results.   Original Report Authenticated By: Malachy Moan, M.D.   Dg Chest Port 1 View  04/12/2013   *RADIOLOGY REPORT*  Clinical Data: Status epilepticus.  Code stroke. New intubation.  PORTABLE CHEST - 1 VIEW  Comparison: Chest x-ray dated 06/27/2007  Findings: Endotracheal tube is at the thoracic inlet in good position.  NG tube has been inserted and the tip is at the gastroesophageal junction and needs to be advanced.  Heart size and vascularity are normal and the lungs are clear.  No osseous abnormality.  IMPRESSION:  1.  Endotracheal tube in good position. 2.  NG tube is at the gastroesophageal junction  and needs to be advanced. 3.  No acute abnormalities in the chest.   Original Report Authenticated By: Francene Boyers, M.D.   1. Generalized seizures   2. Hypertensive urgency   3. Hyperglycemia   4. Status epilepticus   5. Hypertensive crisis   6. Hyperglycemic hyperosmolar nonketotic coma   7. Hypertensive encephalopathy     MDM  The patient appears reasonably stabilized for admission considering the current resources, flow, and capabilities available in the ED at this time, and I doubt any other [facility] requiring further screening and/or treatment in the ED prior to admission.  Hurman Horn, MD 04/12/13 [phone]

## 2013-04-12 NOTE — ED Notes (Signed)
Pt presents to ED via EMS after having seizure at home. EMS gave 10mg  IV versed without change. Pt continuing to have seizure on arrival to ED.

## 2013-04-12 NOTE — H&P (Signed)
PULMONARY  / CRITICAL CARE MEDICINE  Name: Jane Higgins MRN: [id number] DOB: 25-Jan-1956    ADMISSION DATE:  04/12/2013 CONSULTATION DATE:  04/12/2013  REFERRING MD :  EDP Dr. Fonnie Jarvis PRIMARY SERVICE: CCM  CHIEF COMPLAINT:  Seizure, hyperglycemia  BRIEF PATIENT DESCRIPTION: 57 yo F with status epilepticus, marked hypertension SBP >250, hyperglycemia with glucose >1000.  SIGNIFICANT EVENTS / STUDIES:  7/6 >>> admit, gluc 1000, status epilepticus, SBP> 250, intubated  LINES / TUBES: ETT 7/6>>> PIV x 3  CULTURES: 7/6 blood >>>  ANTIBIOTICS: unasyn 7/6 >>>  HISTORY OF PRESENT ILLNESS:  57 yo F with hx of bipolar, not on any meds presents with status epilepticus, hypertension, and hyperglycemia. Per EDP, pt went into bathroom for 30 minutes earlier today, and upon leaving was weak and began to have seizure activity. In status epilepticus en route to ER, required intubation upon arrival.  Day prior and in am , was wnl.  She hasnt taken any meds for bipolar since 2008.  PAST MEDICAL HISTORY :  Past Medical History  Diagnosis Date  . Bipolar 1 disorder    History reviewed. No pertinent past surgical history. Prior to Admission medications   Medication Sig Start Date End Date Taking? Authorizing Provider  Aspirin-Salicylamide-Caffeine (BC HEADACHE POWDER PO) Take 1 packet by mouth daily as needed (pain).   Yes Historical Provider, MD  ibuprofen (ADVIL) 200 MG tablet Take 800 mg by mouth daily as needed for pain.   Yes Historical Provider, MD   No Known Allergies  FAMILY HISTORY:  History reviewed. No pertinent family history. SOCIAL HISTORY:  has no tobacco, alcohol, and drug history on file.  REVIEW OF SYSTEMS:  Unable to ask, pt intubated  SUBJECTIVE: Unable to ask, pt intubated  VITAL SIGNS: Temp:  [96.4 F (35.8 C)-97.7 F (36.5 C)] 97.4 F (36.3 C) (07/06 1800) Pulse Rate:  [73-92] 80 (07/06 1800) Resp:  [11-28] 17 (07/06 1800) BP: (126-254)/(66-143) 139/66 mmHg  (07/06 1800) SpO2:  [98 %-100 %] 100 % (07/06 1800) FiO2 (%):  [40 %-100 %] 40 % (07/06 1747) Weight:  [147 lb 11.3 oz (66.999 kg)] 147 lb 11.3 oz (66.999 kg) (07/06 1437) HEMODYNAMICS:   VENTILATOR SETTINGS: Vent Mode:  [-] PRVC FiO2 (%):  [40 %-100 %] 40 % Set Rate:  [12 bmp-14 bmp] 12 bmp Vt Set:  [470 mL] 470 mL PEEP:  [5 cmH20] 5 cmH20 Plateau Pressure:  [14 cmH20] 14 cmH20 INTAKE / OUTPUT: Intake/Output     07/05 0701 - 07/06 0700 07/06 0701 - 07/07 0700   I.V. (mL/kg)  214 (3.2)   Other  3000   Total Intake(mL/kg)  3214 (48)   Urine (mL/kg/hr)  1275   Total Output   1275   Net   +1939          PHYSICAL EXAMINATION: General:  NAD, sedated on ventilator Neuro:  PERRL 2 mm HEENT:  ETT in place Cardiovascular:  RRR Lungs:  Coarse inspiratory bilaterally Abdomen:  Soft, nontender to palpation Musculoskeletal:  Extremities atraumatic Skin:  No rashes noted  LABS:  Recent Labs Lab 04/12/13 1435 04/12/13 1441 04/12/13 1459 04/12/13 1613  HGB  --  18.1* 13.6  --   WBC  --  5.9  --   --   PLT  --  74*  --   --   NA  --  127* 132*  --   K  --  3.4* 3.4*  --   CL  --  95*  97  --   CO2  --  23  --   --   GLUCOSE  --  1019* >700*  --   BUN  --  20 24*  --   CREATININE  --  1.66* 1.60*  --   CALCIUM  --  8.5  --   --   AST  --  13  --   --   ALT  --  12  --   --   ALKPHOS  --  140*  --   --   BILITOT  --  0.3  --   --   PROT  --  6.8  --   --   ALBUMIN  --  3.5  --   --   APTT  --  28  --   --   INR  --  0.97  --   --   TROPONINI <0.30  --   --   --   PHART  --   --   --  7.373  PCO2ART  --   --   --  40.1  PO2ART  --   --   --  333.0*    Recent Labs Lab 04/12/13 1513  GLUCAP >600*    CXR: good placement of ETT, NG at GE junction, no acute abnormalities in chest  ASSESSMENT / PLAN:  PULMONARY A: Concern for aspiration during status epilepticus (per EDP, vomit suctioned), VDRF VDRF P:   abx unasyn CXR in AM for developing  infiltrates? Continue on vent, 8 cc/kg abg reviewed, pa02 high, decrease FiO2 to 40% Maintain current MV  CARDIOVASCULAR A: Hypertension crisis P:  Continue nicardipine infusion Goal SBP 150-170s (25% reduction highest MAP / sys) Trop Echo consideration in setting htn , cva?  RENAL A:  AKI creat 1.66, last known prior was 1.0 P:   Hydrate with NS @ 100cc/hr Replete K  GASTROINTESTINAL A:  No acute issues P:   PPI for stress ulcer ppx Start tube feeds tonight  HEMATOLOGIC A:  Thrombocytopenia unclear etiology, r/o chronic (liverdz?), r/o ITP DVT prevention P:  Repeat CBC Check coags  SCD's for DVT prevention  INFECTIOUS A:  Concern aspiration PNA (vomit suctioned in ED) P:   Start unasyn Blood & sputum cx Check HIV ab  ENDOCRINE A:  Marked hyperglycemia (HONK), no anion gap, no acidosis on ABG P:   Hydrate with IVF Check serum osmoles Insulin drip  NEUROLOGIC A:  Status epilepticus Rule out ischemic CVA, rule out ingestion/drug abuse R/o PRES P:   Check UDS Check serum osmoles Keppra per neuro EEG Propofol on vent Check MRI/MRA head BP and temp control   TODAY'S SUMMARY: admit, on vent, IV hydration, MRI to rule out ischemic event  Vs PRES, BP control with nicardipine infusion I upated entire family extensive  Levert Feinstein, MD Family Medicine PGY-2  I have personally obtained a history, examined the patient, evaluated laboratory and imaging results, formulated the assessment and plan and placed orders. CRITICAL CARE: The patient is critically ill with multiple organ systems failure and requires high complexity decision making for assessment and support, frequent evaluation and titration of therapies, application of advanced monitoring technologies and extensive interpretation of multiple databases. Critical Care Time devoted to patient care services described in this note is 45 minutes.   Mcarthur Rossetti. Tyson Alias, MD, FACP Pgr: [phone] [place]  Pulmonary & Critical Care  Pulmonary and Critical Care Medicine [facility] Pager: [phone]  04/12/2013, 6:29 PM

## 2013-04-13 ENCOUNTER — Inpatient Hospital Stay (HOSPITAL_COMMUNITY): Payer: Medicare Other

## 2013-04-13 DIAGNOSIS — J9600 Acute respiratory failure, unspecified whether with hypoxia or hypercapnia: Secondary | ICD-10-CM

## 2013-04-13 LAB — GLUCOSE, CAPILLARY
Glucose-Capillary: 126 mg/dL — ABNORMAL HIGH (ref 70–99)
Glucose-Capillary: 146 mg/dL — ABNORMAL HIGH (ref 70–99)
Glucose-Capillary: 195 mg/dL — ABNORMAL HIGH (ref 70–99)
Glucose-Capillary: 250 mg/dL — ABNORMAL HIGH (ref 70–99)
Glucose-Capillary: 479 mg/dL — ABNORMAL HIGH (ref 70–99)
Glucose-Capillary: 53 mg/dL — ABNORMAL LOW (ref 70–99)
Glucose-Capillary: 78 mg/dL (ref 70–99)
Glucose-Capillary: 86 mg/dL (ref 70–99)
Glucose-Capillary: >600 mg/dL (ref 70–99)
Glucose-Capillary: 111 mg/dL — ABNORMAL HIGH (ref 70–99)
Glucose-Capillary: 72 mg/dL (ref 70–99)
Glucose-Capillary: 202 mg/dL — ABNORMAL HIGH (ref 70–99)

## 2013-04-13 MED ORDER — METOPROLOL TARTRATE 1 MG/ML IV SOLN: 2.5000 mg | INTRAVENOUS | Status: DC | PRN

## 2013-04-13 MED ORDER — FENTANYL CITRATE 0.05 MG/ML IJ SOLN: 12.5000 ug | INTRAMUSCULAR | Status: DC | PRN

## 2013-04-13 MED ORDER — ENOXAPARIN SODIUM 40 MG/0.4ML ~~LOC~~ SOLN
40.0000 mg | SUBCUTANEOUS | Status: DC
  Administered 2013-04-13 – 2013-04-16 (×4): 40 mg via SUBCUTANEOUS
  Filled 2013-04-13 (×4): qty 0.4

## 2013-04-13 MED ORDER — INSULIN GLARGINE 100 UNIT/ML ~~LOC~~ SOLN: 12.0000 [IU] | Freq: Every day | SUBCUTANEOUS | Status: DC

## 2013-04-13 MED ORDER — BIOTENE DRY MOUTH MT LIQD: 15.0000 mL | Freq: Four times a day (QID) | OROMUCOSAL | Status: DC

## 2013-04-13 MED ORDER — CHLORHEXIDINE GLUCONATE 0.12 % MT SOLN
OROMUCOSAL | Status: AC
  Filled 2013-04-13: qty 15

## 2013-04-13 MED ORDER — CHLORHEXIDINE GLUCONATE 0.12 % MT SOLN
15.0000 mL | Freq: Two times a day (BID) | OROMUCOSAL | Status: DC
  Administered 2013-04-13: 15 mL via OROMUCOSAL

## 2013-04-13 MED ORDER — HYDRALAZINE HCL 20 MG/ML IJ SOLN
10.0000 mg | INTRAMUSCULAR | Status: DC | PRN
  Administered 2013-04-13 – 2013-04-14 (×3): 20 mg via INTRAVENOUS
  Filled 2013-04-13 (×3): qty 1

## 2013-04-13 MED ORDER — NICOTINE 21 MG/24HR TD PT24
21.0000 mg | MEDICATED_PATCH | Freq: Every day | TRANSDERMAL | Status: DC
  Administered 2013-04-13 – 2013-04-16 (×4): 21 mg via TRANSDERMAL
  Filled 2013-04-13 (×4): qty 1

## 2013-04-13 MED ORDER — DEXTROSE 50 % IV SOLN
INTRAVENOUS | Status: AC
  Administered 2013-04-13: 19 mL
  Filled 2013-04-13: qty 50

## 2013-04-13 MED ORDER — INSULIN GLARGINE 100 UNIT/ML ~~LOC~~ SOLN
10.0000 [IU] | Freq: Every day | SUBCUTANEOUS | Status: DC
  Administered 2013-04-13 – 2013-04-14 (×2): 10 [IU] via SUBCUTANEOUS
  Filled 2013-04-13 (×2): qty 0.1

## 2013-04-13 MED ORDER — INSULIN ASPART 100 UNIT/ML ~~LOC~~ SOLN
0.0000 [IU] | SUBCUTANEOUS | Status: DC
  Administered 2013-04-13 (×3): 5 [IU] via SUBCUTANEOUS
  Administered 2013-04-14: 2 [IU] via SUBCUTANEOUS
  Administered 2013-04-14: 3 [IU] via SUBCUTANEOUS

## 2013-04-13 MED ORDER — KCL IN DEXTROSE-NACL 40-5-0.45 MEQ/L-%-% IV SOLN
INTRAVENOUS | Status: DC
  Administered 2013-04-13 – 2013-04-14 (×2): via INTRAVENOUS
  Filled 2013-04-13 (×2): qty 1000

## 2013-04-13 NOTE — Progress Notes (Signed)
PULMONARY  / CRITICAL CARE MEDICINE  Name: Jane Higgins MRN: [id number] DOB: 06/02/1956    ADMISSION DATE:  04/12/2013 CONSULTATION DATE:  04/12/2013  REFERRING MD :  EDP Dr. Fonnie Jarvis PRIMARY SERVICE: CCM  CHIEF COMPLAINT:  Seizure, hyperglycemia  BRIEF PATIENT DESCRIPTION: 57 yo F with status epilepticus, marked hypertension SBP >250, hyperglycemia with glucose >1000.  SIGNIFICANT EVENTS / STUDIES:  7/6 >>> admit, gluc 1000, status epilepticus, SBP> 250, intubated  LINES / TUBES: ETT 7/6>>>7/7 PIV x 3  CULTURES: 7/6 blood >>>  ANTIBIOTICS: unasyn 7/6 >>> 7/7  SUBJECTIVE:  Patient awake and ornery, tolerating extubation well, wants to get up and be able to go to the bathroom  VITAL SIGNS: Temp:  [96.4 F (35.8 C)-98.8 F (37.1 C)] 98.8 F (37.1 C) (07/07 0945) Pulse Rate:  [67-100] 100 (07/07 0945) Resp:  [11-31] 31 (07/07 0945) BP: (116-254)/(60-143) 181/104 mmHg (07/07 0932) SpO2:  [93 %-100 %] 96 % (07/07 0945) FiO2 (%):  [39.9 %-100 %] 40.2 % (07/07 0900) Weight:  [64.1 kg (141 lb 5 oz)-66.999 kg (147 lb 11.3 oz)] 64.1 kg (141 lb 5 oz) (07/07 0500) HEMODYNAMICS:   VENTILATOR SETTINGS: Vent Mode:  [-] PSV;CPAP FiO2 (%):  [39.9 %-100 %] 40.2 % Set Rate:  [12 bmp-14 bmp] 12 bmp Vt Set:  [470 mL] 470 mL PEEP:  [5 cmH20] 5 cmH20 Pressure Support:  [5 cmH20] 5 cmH20 Plateau Pressure:  [14 cmH20-16 cmH20] 16 cmH20 INTAKE / OUTPUT: Intake/Output     07/06 0701 - 07/07 0700 07/07 0701 - 07/08 0700   I.V. (mL/kg) 2321.3 (36.2) 328.2 (5.1)   Other 3000    IV Piggyback 405    Total Intake(mL/kg) 5726.3 (89.3) 328.2 (5.1)   Urine (mL/kg/hr) 2125 45 (0.2)   Emesis/NG output 700    Total Output 2825 45   Net +2901.3 +283.2          PHYSICAL EXAMINATION: General:  NAD Neuro:  A&Ox4, appropriate conversational skills, can follow commands HEENT:  MM moist and pink, appropriate phonation, PERLA Cardiovascular:  RRR Lungs:  CTA bilaterally Abdomen:  Soft, nontender  to palpation Musculoskeletal:  Extremities atraumatic Skin:  No rashes noted  LABS:  Recent Labs Lab 04/12/13 1435 04/12/13 1441 04/12/13 1459 04/12/13 1613 04/12/13 1926  HGB  --  18.1* 13.6  --   --   WBC  --  5.9  --   --   --   PLT  --  74*  --   --   --   NA  --  127* 132*  --   --   K  --  3.4* 3.4*  --   --   CL  --  95* 97  --   --   CO2  --  23  --   --   --   GLUCOSE  --  1019* >700*  --   --   BUN  --  20 24*  --   --   CREATININE  --  1.66* 1.60*  --   --   CALCIUM  --  8.5  --   --   --   AST  --  13  --   --   --   ALT  --  12  --   --   --   ALKPHOS  --  140*  --   --   --   BILITOT  --  0.3  --   --   --  PROT  --  6.8  --   --   --   ALBUMIN  --  3.5  --   --   --   APTT  --  28  --   --  26  INR  --  0.97  --   --  0.99  TROPONINI <0.30  --   --   --  <0.30  PHART  --   --   --  7.373  --   PCO2ART  --   --   --  40.1  --   PO2ART  --   --   --  333.0*  --     Recent Labs Lab 04/13/13 0243 04/13/13 0258 04/13/13 0403 04/13/13 0452 04/13/13 0601  GLUCAP 53* 146* 75 72 78    CXR 7/7 LLL atelectasis  ASSESSMENT / PLAN:  PULMONARY A:  Concern for aspiration during status epilepticus (per EDP, vomit suctioned) VDRF- resolved 7/7 P:   -D/c unasyn -no further CXRs unless change in fever/WBC curve or respiratory status  CARDIOVASCULAR A:  Hypertensive crisis - troponins negative P:  D/c nicardipine drip Cont Hydralazine and lopressor Consider change to orals tomorrow as diet advances Goal SBP 150-170s (25% reduction highest MAP / sys) Nicotine patch as patient is smoker Consider Echo as outpatient once acute illness resolves  RENAL A:   AKI creat 1.66, last known prior was 1.0 P:   -Repeat Bmet in am check for decrease in creatinine  -Hydrate with NS @ 100cc/hr  GASTROINTESTINAL A:  No acute issues P:   -D/c PPI -Transition to oral diabetic diet as tolerated  HEMATOLOGIC A:   Thrombocytopenia unclear etiology DVT  prevention P:  Repeat CBC in am  Lovenox for DVT prophylaxis  INFECTIOUS A:  Concern aspiration PNA (vomit suctioned in ED) P:   D/c unasyn Monitor fever curve and WBC trends off abx Blood cultures pending HIV non-reactive  ENDOCRINE A:  Marked hyperglycemia (HONK), no anion gap, no acidosis on ABG P:   Hydrate with IVF Hyperglycemia protocol  NEUROLOGIC A:   Status epilepticus-resolved 7/6 P:   Keppra per neuro Treat BP  TODAY'S SUMMARY:  Patient extubated 7/7.  Neuro status much improved.  Will continue to treat BP, but nicardipine drip has been d/c'd.  Will defer MRI for now as mentation is appropriate.  Patient advancing to carb modified diet.  Will transition to oral medicine as diet advances.  Will repeat cbc in the am to follow thrombocytopenia.  HIV, and coags normal.  Cause unknown at this time.  Will d/c unasyn and monitor off abx.        I have personally obtained a history, examined the patient, evaluated laboratory and imaging results, formulated the assessment and plan and placed orders.   Carley Hammed Physician Assistant Student 04/13/2013, 11:21 AM  I have interviewed and examined the patient and reviewed the database. I have formulated the assessment and plan as reflected in the note above with amendments made by me. 30 mins of direct critical care time provided  Billy Fischer, MD;  PCCM service; Mobile [phone]

## 2013-04-13 NOTE — Procedures (Signed)
ELECTROENCEPHALOGRAM REPORT   Patient: Jane Higgins       Room #: 9604 EEG No. ID: [id number] Age: 57 y.o.        Sex: female Referring Physician: Dr. Tyson Alias Report Date:  04/13/2013        Interpreting Physician: Aline Brochure  History: [name] is an 57 y.o. female admitted on 04/12/2013 with generalized seizures, hypertensive crisis and hyperosmolar state.  Indications for study:  Assess severity of encephalopathy as well as to rule out ongoing seizure activity.  Technique: This is an 18 channel routine scalp EEG performed at the bedside with bipolar and monopolar montages arranged in accordance to the international 10/20 system of electrode placement.   Description: EEG was performed following successful extubation. Background activity during wakefulness consisted of low amplitude irregular diffuse delta activity with superimposed 8-9 Hz alpha activity reported from the posterior head regions. Photic stimulation was not performed. During periods of light sleep symmetrical vertex waves and sleep spindles, as well as K-complexes are recorded. No epileptiform discharges were recorded.  Interpretation: EEG showed mild generalized nonspecific slowing of cerebral activity consistent with probable resolving encephalopathic state. No evidence of an epileptic disorder was demonstrated.   Venetia Maxon M.D. [facility] [phone]

## 2013-04-13 NOTE — Progress Notes (Signed)
Utilization review completed.  P.J. Layth Cerezo,RN,BSN Case Manager [phone]  

## 2013-04-13 NOTE — Progress Notes (Addendum)
Inpatient Diabetes Program Recommendations  AACE/ADA: New Consensus Statement on Inpatient Glycemic Control (2013)  Target Ranges:  Prepandial:   less than 140 mg/dL      Peak postprandial:   less than 180 mg/dL (1-2 hours)      Critically ill patients:  140 - 180 mg/dL   Reason for Visit: Note patient admitted with hyperglycemia. No history of diabetes noted.  Patient was on insulin drip but now on correction moderate q 4 hours.  Please check A1C to determine pre-hospitalization glycemic control. Will follow.       1500 Note CBG up to 238 mg/dL at 1610.  Consider adding Lantus 12 units daily.  Called and discussed with Dr. Delton Coombes.  Orders received to start Lantus 10 units daily and to do A1C.

## 2013-04-13 NOTE — Progress Notes (Signed)
Subjective: No recurrence of seizure activity reported. No significant overnight adverse clinical events reported. All  Objective: Current vital signs: BP 181/104  Pulse 100  Temp(Src) 98.8 F (37.1 C) (Core (Comment))  Resp 31  Ht 5\' 6"  (1.676 m)  Wt 64.1 kg (141 lb 5 oz)  BMI 22.82 kg/m2  SpO2 96%  Neurologic Exam: Patient remains intubated and on mechanical ventilation. She had spontaneous respirations and is in the process of being weaned from ventilatory support. Pupils were equal and reacted normally to light. Extraocular movements were intact with oculocephalic maneuvers. No facial weakness was noted. Patient moved extremities equally with normal symmetrical strength throughout. Muscle tone was normal throughout. Deep tendon reflexes were hypoactive and symmetrical. Plantar responses were mute bilaterally.  Lab Results: Results for orders placed during the hospital encounter of 04/12/13 (from the past 48 hour(s))  TROPONIN I     Status: None   Collection Time    04/12/13  2:35 PM      Result Value Range   Troponin I <0.30  <0.30 ng/mL   Comment:            Due to the release kinetics of cTnI,     a negative result within the first hours     of the onset of symptoms does not rule out     myocardial infarction with certainty.     If myocardial infarction is still suspected,     repeat the test at appropriate intervals.  ETHANOL     Status: None   Collection Time    04/12/13  2:41 PM      Result Value Range   Alcohol, Ethyl (B) <11  0 - 11 mg/dL   Comment:            LOWEST DETECTABLE LIMIT FOR     SERUM ALCOHOL IS 11 mg/dL     FOR MEDICAL PURPOSES ONLY  PROTIME-INR     Status: None   Collection Time    04/12/13  2:41 PM      Result Value Range   Prothrombin Time 12.7  11.6 - 15.2 seconds   INR 0.97  0.00 - 1.49  APTT     Status: None   Collection Time    04/12/13  2:41 PM      Result Value Range   aPTT 28  24 - 37 seconds  CBC     Status: Abnormal   Collection Time    04/12/13  2:41 PM      Result Value Range   WBC 5.9  4.0 - 10.5 K/uL   RBC 6.95 (*) 3.87 - 5.11 MIL/uL   Hemoglobin 18.1 (*) 12.0 - 15.0 g/dL   HCT 16.1 (*) 09.6 - 04.5 %   MCV 78.6  78.0 - 100.0 fL   MCH 26.0  26.0 - 34.0 pg   MCHC 33.2  30.0 - 36.0 g/dL   RDW 40.9  81.1 - 91.4 %   Platelets 74 (*) 150 - 400 K/uL   Comment: PLATELET COUNT CONFIRMED BY SMEAR  DIFFERENTIAL     Status: None   Collection Time    04/12/13  2:41 PM      Result Value Range   Neutrophils Relative % 62  43 - 77 %   Lymphocytes Relative 31  12 - 46 %   Monocytes Relative 5  3 - 12 %   Eosinophils Relative 1  0 - 5 %   Basophils Relative 1  0 -  1 %   Neutro Abs 3.6  1.7 - 7.7 K/uL   Lymphs Abs 1.8  0.7 - 4.0 K/uL   Monocytes Absolute 0.3  0.1 - 1.0 K/uL   Eosinophils Absolute 0.1  0.0 - 0.7 K/uL   Basophils Absolute 0.1  0.0 - 0.1 K/uL   RBC Morphology ELLIPTOCYTES    COMPREHENSIVE METABOLIC PANEL     Status: Abnormal   Collection Time    04/12/13  2:41 PM      Result Value Range   Sodium 127 (*) 135 - 145 mEq/L   Potassium 3.4 (*) 3.5 - 5.1 mEq/L   Chloride 95 (*) 96 - 112 mEq/L   CO2 23  19 - 32 mEq/L   Glucose, Bld 1019 (*) 70 - 99 mg/dL   Comment: CRITICAL RESULT CALLED TO, READ BACK BY AND VERIFIED WITH:     T KIRICHENKO,PA AT 1604 04/12/13 BY ZPERRY.   BUN 20  6 - 23 mg/dL   Creatinine, Ser 1.61 (*) 0.50 - 1.10 mg/dL   Calcium 8.5  8.4 - 09.6 mg/dL   Total Protein 6.8  6.0 - 8.3 g/dL   Albumin 3.5  3.5 - 5.2 g/dL   AST 13  0 - 37 U/L   ALT 12  0 - 35 U/L   Alkaline Phosphatase 140 (*) 39 - 117 U/L   Total Bilirubin 0.3  0.3 - 1.2 mg/dL   GFR calc non Af Amer 33 (*) >90 mL/min   GFR calc Af Amer 38 (*) >90 mL/min   Comment:            The eGFR has been calculated     using the CKD EPI equation.     This calculation has not been     validated in all clinical     situations.     eGFR's persistently     <90 mL/min signify     possible Chronic Kidney Disease.  POCT  I-STAT TROPONIN I     Status: None   Collection Time    04/12/13  2:57 PM      Result Value Range   Troponin i, poc 0.00  0.00 - 0.08 ng/mL   Comment 3            Comment: Due to the release kinetics of cTnI,     a negative result within the first hours     of the onset of symptoms does not rule out     myocardial infarction with certainty.     If myocardial infarction is still suspected,     repeat the test at appropriate intervals.  POCT I-STAT, CHEM 8     Status: Abnormal   Collection Time    04/12/13  2:59 PM      Result Value Range   Sodium 132 (*) 135 - 145 mEq/L   Potassium 3.4 (*) 3.5 - 5.1 mEq/L   Chloride 97  96 - 112 mEq/L   BUN 24 (*) 6 - 23 mg/dL   Creatinine, Ser 0.45 (*) 0.50 - 1.10 mg/dL   Glucose, Bld >409 (*) 70 - 99 mg/dL   Calcium, Ion 8.11 (*) 1.12 - 1.23 mmol/L   TCO2 25  0 - 100 mmol/L   Hemoglobin 13.6  12.0 - 15.0 g/dL   HCT 91.4  78.2 - 95.6 %   Comment NOTIFIED PHYSICIAN    GLUCOSE, CAPILLARY     Status: Abnormal   Collection Time    04/12/13  3:13 PM      Result Value Range   Glucose-Capillary >600 (*) 70 - 99 mg/dL   Comment 1 Documented in Chart     Comment 2 Notify RN    URINE RAPID DRUG SCREEN (HOSP PERFORMED)     Status: Abnormal   Collection Time    04/12/13  3:30 PM      Result Value Range   Opiates NONE DETECTED  NONE DETECTED   Cocaine NONE DETECTED  NONE DETECTED   Benzodiazepines POSITIVE (*) NONE DETECTED   Amphetamines NONE DETECTED  NONE DETECTED   Tetrahydrocannabinol NONE DETECTED  NONE DETECTED   Barbiturates NONE DETECTED  NONE DETECTED   Comment:            DRUG SCREEN FOR MEDICAL PURPOSES     ONLY.  IF CONFIRMATION IS NEEDED     FOR ANY PURPOSE, NOTIFY LAB     WITHIN 5 DAYS.                LOWEST DETECTABLE LIMITS     FOR URINE DRUG SCREEN     Drug Class       Cutoff (ng/mL)     Amphetamine      1000     Barbiturate      200     Benzodiazepine   200     Tricyclics       300     Opiates          300     Cocaine           300     THC              50  URINALYSIS, ROUTINE W REFLEX MICROSCOPIC     Status: Abnormal   Collection Time    04/12/13  3:30 PM      Result Value Range   Color, Urine YELLOW  YELLOW   APPearance CLEAR  CLEAR   Specific Gravity, Urine 1.020  1.005 - 1.030   pH 6.5  5.0 - 8.0   Glucose, UA >1000 (*) NEGATIVE mg/dL   Hgb urine dipstick SMALL (*) NEGATIVE   Bilirubin Urine NEGATIVE  NEGATIVE   Ketones, ur NEGATIVE  NEGATIVE mg/dL   Protein, ur 952 (*) NEGATIVE mg/dL   Urobilinogen, UA 0.2  0.0 - 1.0 mg/dL   Nitrite NEGATIVE  NEGATIVE   Leukocytes, UA NEGATIVE  NEGATIVE  URINE MICROSCOPIC-ADD ON     Status: None   Collection Time    04/12/13  3:30 PM      Result Value Range   Squamous Epithelial / LPF RARE  RARE   WBC, UA 0-2  <3 WBC/hpf   RBC / HPF 0-2  <3 RBC/hpf   Bacteria, UA RARE  RARE  POCT I-STAT 3, BLOOD GAS (G3+)     Status: Abnormal   Collection Time    04/12/13  4:13 PM      Result Value Range   pH, Arterial 7.373  7.350 - 7.450   pCO2 arterial 40.1  35.0 - 45.0 mmHg   pO2, Arterial 333.0 (*) 80.0 - 100.0 mmHg   Bicarbonate 23.3  20.0 - 24.0 mEq/L   TCO2 25  0 - 100 mmol/L   O2 Saturation 100.0     Acid-base deficit 2.0  0.0 - 2.0 mmol/L   Patient temperature 98.7 F     Collection site RADIAL, ALLEN'S TEST ACCEPTABLE     Drawn by Designer, television/film set  Sample type ARTERIAL    GLUCOSE, CAPILLARY     Status: Abnormal   Collection Time    04/12/13  5:51 PM      Result Value Range   Glucose-Capillary >600 (*) 70 - 99 mg/dL  MRSA PCR SCREENING     Status: None   Collection Time    04/12/13  5:59 PM      Result Value Range   MRSA by PCR NEGATIVE  NEGATIVE   Comment:            The GeneXpert MRSA Assay (FDA     approved for NASAL specimens     only), is one component of a     comprehensive MRSA colonization     surveillance program. It is not     intended to diagnose MRSA     infection nor to guide or     monitor treatment for     MRSA infections.  GLUCOSE,  CAPILLARY     Status: Abnormal   Collection Time    04/12/13  6:55 PM      Result Value Range   Glucose-Capillary 579 (*) 70 - 99 mg/dL   Comment 1 Documented in Chart    APTT     Status: None   Collection Time    04/12/13  7:26 PM      Result Value Range   aPTT 26  24 - 37 seconds  PROTIME-INR     Status: None   Collection Time    04/12/13  7:26 PM      Result Value Range   Prothrombin Time 12.9  11.6 - 15.2 seconds   INR 0.99  0.00 - 1.49  TROPONIN I     Status: None   Collection Time    04/12/13  7:26 PM      Result Value Range   Troponin I <0.30  <0.30 ng/mL   Comment:            Due to the release kinetics of cTnI,     a negative result within the first hours     of the onset of symptoms does not rule out     myocardial infarction with certainty.     If myocardial infarction is still suspected,     repeat the test at appropriate intervals.  OSMOLALITY     Status: Abnormal   Collection Time    04/12/13  7:26 PM      Result Value Range   Osmolality 319 (*) 275 - 300 mOsm/kg  HIV ANTIBODY (ROUTINE TESTING)     Status: None   Collection Time    04/12/13  7:26 PM      Result Value Range   HIV NON REACTIVE  NON REACTIVE  GLUCOSE, CAPILLARY     Status: Abnormal   Collection Time    04/12/13  8:00 PM      Result Value Range   Glucose-Capillary 479 (*) 70 - 99 mg/dL  GLUCOSE, CAPILLARY     Status: Abnormal   Collection Time    04/12/13  8:53 PM      Result Value Range   Glucose-Capillary 416 (*) 70 - 99 mg/dL  GLUCOSE, CAPILLARY     Status: Abnormal   Collection Time    04/12/13  9:47 PM      Result Value Range   Glucose-Capillary 347 (*) 70 - 99 mg/dL  GLUCOSE, CAPILLARY     Status: Abnormal   Collection Time  04/12/13 11:00 PM      Result Value Range   Glucose-Capillary 250 (*) 70 - 99 mg/dL  GLUCOSE, CAPILLARY     Status: Abnormal   Collection Time    04/13/13 12:03 AM      Result Value Range   Glucose-Capillary 195 (*) 70 - 99 mg/dL  GLUCOSE,  CAPILLARY     Status: Abnormal   Collection Time    04/13/13 12:56 AM      Result Value Range   Glucose-Capillary 126 (*) 70 - 99 mg/dL  GLUCOSE, CAPILLARY     Status: None   Collection Time    04/13/13  1:45 AM      Result Value Range   Glucose-Capillary 86  70 - 99 mg/dL  GLUCOSE, CAPILLARY     Status: Abnormal   Collection Time    04/13/13  2:43 AM      Result Value Range   Glucose-Capillary 53 (*) 70 - 99 mg/dL  GLUCOSE, CAPILLARY     Status: Abnormal   Collection Time    04/13/13  2:58 AM      Result Value Range   Glucose-Capillary 146 (*) 70 - 99 mg/dL  GLUCOSE, CAPILLARY     Status: None   Collection Time    04/13/13  4:03 AM      Result Value Range   Glucose-Capillary 75  70 - 99 mg/dL  GLUCOSE, CAPILLARY     Status: None   Collection Time    04/13/13  4:52 AM      Result Value Range   Glucose-Capillary 72  70 - 99 mg/dL  GLUCOSE, CAPILLARY     Status: None   Collection Time    04/13/13  6:01 AM      Result Value Range   Glucose-Capillary 78  70 - 99 mg/dL    Studies/Results: Ct Head Wo Contrast  04/12/2013   *RADIOLOGY REPORT*  Clinical Data: Status epilepticus, code stroke  CT HEAD WITHOUT CONTRAST  Technique:  Contiguous axial images were obtained from the base of the skull through the vertex without contrast.  Comparison: None.  Findings: No acute intracranial hemorrhage, acute infarction, mass lesion, mass effect, midline shift or hydrocephalus.  Gray-white differentiation is preserved throughout.  The globes are intact bilaterally.  Symmetric and unremarkable orbits.  No focal soft tissue abnormality.  Normal aeration of the mastoid air cells and visualized paranasal sinuses.  No focal calvarial abnormality.  IMPRESSION:  Negative head CT.  These results were called by telephone on 04/12/2013 at 03:04 p.m. to Dr. Roseanne Reno, who verbally acknowledged these results.   Original Report Authenticated By: Malachy Moan, M.D.   Dg Chest Port 1 View  04/13/2013    *RADIOLOGY REPORT*  Clinical Data: Endotracheal tube positioning  PORTABLE CHEST - 1 VIEW  Comparison: 04/12/2013  Findings: Endotracheal tube tip is 5.3 cm above the carina. Nasogastric tube tip in the stomach.  There is new mild airspace opacity along the left hemidiaphragm favoring atelectasis over early pneumonia.  The lungs appear otherwise clear.  Borderline cardiomegaly, cardiothoracic index 58%.  IMPRESSION:  1.  Mildly increased airspace opacity along the left hemidiaphragm, favoring atelectasis. 2.  Borderline cardiomegaly.   Original Report Authenticated By: Gaylyn Rong, M.D.   Dg Chest Port 1 View  04/12/2013   *RADIOLOGY REPORT*  Clinical Data: Status epilepticus.  Code stroke. New intubation.  PORTABLE CHEST - 1 VIEW  Comparison: Chest x-ray dated 06/27/2007  Findings: Endotracheal tube is at the thoracic inlet  in good position.  NG tube has been inserted and the tip is at the gastroesophageal junction and needs to be advanced.  Heart size and vascularity are normal and the lungs are clear.  No osseous abnormality.  IMPRESSION:  1.  Endotracheal tube in good position. 2.  NG tube is at the gastroesophageal junction and needs to be advanced. 3.  No acute abnormalities in the chest.   Original Report Authenticated By: Francene Boyers, M.D.    Medications:  I have reviewed the patient's current medications. Scheduled: . antiseptic oral rinse  15 mL Mouth Rinse QID  . chlorhexidine  15 mL Mouth Rinse BID  . enoxaparin (LOVENOX) injection  40 mg Subcutaneous Q24H  . insulin aspart  0-15 Units Subcutaneous Q4H  . levETIRAcetam  500 mg Intravenous Q12H  . pneumococcal 23 valent vaccine  0.5 mL Intramuscular Tomorrow-1000   Continuous: . dextrose 5 % and 0.45 % NaCl with KCl 40 mEq/L 50 mL/hr at 04/13/13 0953   GMW:NUUVOZDG, hydrALAZINE, LORazepam, metoprolol  Assessment/Plan: Generalized seizure activity associated with encephalopathy most likely due to a combination of hypertensive  urgency and marked hyperglycemia. Blood pressure and blood sugar were controlled at this point. Patient said no recurrence of seizure activity and is showing no focal deficits clinically. MRI to rule out stroke is pending. She is on Keppra 500 mg every 12 hours for seizure management. EEG is pending.  No changes in current management recommended. We will continue to follow this patient closely with you.  C.R. Roseanne Reno, MD [facility] [phone]  04/13/2013  10:48 AM

## 2013-04-13 NOTE — Procedures (Signed)
Extubation Procedure Note  Patient Details:   Name: Jane Higgins DOB: 1956-06-07 MRN: [id number]    Pt extubated after successful SBT.  Pt able to vocalize and has good cough.  Will continue to monitor.    Evaluation  O2 sats: stable throughout Complications: No apparent complications Patient did tolerate procedure well. Bilateral Breath Sounds: Clear Suctioning: Airway Yes  Jane Higgins Apple 04/13/2013, 9:38 AM

## 2013-04-13 NOTE — Progress Notes (Signed)
Portable EEG completed

## 2013-04-14 ENCOUNTER — Inpatient Hospital Stay (HOSPITAL_COMMUNITY): Payer: Medicare Other

## 2013-04-14 DIAGNOSIS — J9600 Acute respiratory failure, unspecified whether with hypoxia or hypercapnia: Secondary | ICD-10-CM

## 2013-04-14 DIAGNOSIS — R569 Unspecified convulsions: Secondary | ICD-10-CM

## 2013-04-14 LAB — GLUCOSE, CAPILLARY
Glucose-Capillary: 264 mg/dL — ABNORMAL HIGH (ref 70–99)
Glucose-Capillary: 195 mg/dL — ABNORMAL HIGH (ref 70–99)
Glucose-Capillary: 159 mg/dL — ABNORMAL HIGH (ref 70–99)

## 2013-04-14 LAB — BASIC METABOLIC PANEL
BUN: 14 mg/dL (ref 6–23)
CO2: 23 mEq/L (ref 19–32)
Chloride: 112 mEq/L (ref 96–112)
Creatinine, Ser: 1.72 mg/dL — ABNORMAL HIGH (ref 0.50–1.10)

## 2013-04-14 LAB — CBC
HCT: 31.9 % — ABNORMAL LOW (ref 36.0–46.0)
MCV: 77.2 fL — ABNORMAL LOW (ref 78.0–100.0)
RDW: 14.6 % (ref 11.5–15.5)
WBC: 16.8 10*3/uL — ABNORMAL HIGH (ref 4.0–10.5)

## 2013-04-14 MED ORDER — BD GETTING STARTED TAKE HOME KIT: 3/10ML X 30G SYRINGES
1.0000 | Freq: Once | Status: AC
  Administered 2013-04-14: 1
  Filled 2013-04-14: qty 1

## 2013-04-14 MED ORDER — INSULIN ASPART 100 UNIT/ML ~~LOC~~ SOLN
0.0000 [IU] | Freq: Three times a day (TID) | SUBCUTANEOUS | Status: DC
  Administered 2013-04-14: 3 [IU] via SUBCUTANEOUS
  Administered 2013-04-14: 8 [IU] via SUBCUTANEOUS
  Administered 2013-04-15: 5 [IU] via SUBCUTANEOUS
  Administered 2013-04-15: 2 [IU] via SUBCUTANEOUS

## 2013-04-14 MED ORDER — LIVING WELL WITH DIABETES BOOK
Freq: Once | Status: AC
  Administered 2013-04-14: 15:00:00
  Filled 2013-04-14: qty 1

## 2013-04-14 MED ORDER — GADOBENATE DIMEGLUMINE 529 MG/ML IV SOLN
5.0000 mL | Freq: Once | INTRAVENOUS | Status: AC | PRN
  Administered 2013-04-14: 5 mL via INTRAVENOUS

## 2013-04-14 MED ORDER — POTASSIUM CHLORIDE CRYS ER 20 MEQ PO TBCR
40.0000 meq | EXTENDED_RELEASE_TABLET | ORAL | Status: AC
  Administered 2013-04-14 (×2): 40 meq via ORAL
  Filled 2013-04-14 (×2): qty 2

## 2013-04-14 MED ORDER — INSULIN GLARGINE 100 UNIT/ML ~~LOC~~ SOLN
10.0000 [IU] | Freq: Every day | SUBCUTANEOUS | Status: DC
  Administered 2013-04-14: 10 [IU] via SUBCUTANEOUS
  Filled 2013-04-14 (×2): qty 0.1

## 2013-04-14 MED ORDER — INSULIN ASPART 100 UNIT/ML ~~LOC~~ SOLN: 0.0000 [IU] | Freq: Every day | SUBCUTANEOUS | Status: DC

## 2013-04-14 NOTE — Progress Notes (Signed)
eLink Physician-Brief Progress Note Patient Name: Jane Higgins DOB: 1956-07-01 MRN: [id number]  Date of Service  04/14/2013   HPI/Events of Note   Low k  eICU Interventions  supp k    Intervention Category Intermediate Interventions: Electrolyte abnormality - evaluation and management  Nelda Bucks. 04/14/2013, 6:40 AM

## 2013-04-14 NOTE — Progress Notes (Signed)
PULMONARY  / CRITICAL CARE MEDICINE  Name: Jane Higgins MRN: [id number] DOB: 05-Mar-1956    ADMISSION DATE:  04/12/2013 CONSULTATION DATE:  04/12/2013  REFERRING MD :  EDP Dr. Fonnie Jarvis PRIMARY SERVICE: CCM  CHIEF COMPLAINT:  Seizure, hyperglycemia  BRIEF PATIENT DESCRIPTION: 57 yo F with status epilepticus, marked hypertension SBP >250, hyperglycemia with glucose >1000.  SIGNIFICANT EVENTS / STUDIES:  7/6 >>> admit, gluc 1000, status epilepticus, SBP> 250, intubated  LINES / TUBES: ETT 7/6>>>7/7 PIV x 3  CULTURES: 7/6 blood >>> NGTD  ANTIBIOTICS: unasyn 7/6 >>> 7/7  SUBJECTIVE:  Patient feels considerably better, and wants to go home  VITAL SIGNS: Temp:  [98.3 F (36.8 C)-101 F (38.3 C)] 98.3 F (36.8 C) (07/08 0800) Pulse Rate:  [70-109] 82 (07/08 1000) Resp:  [9-36] 26 (07/08 1000) BP: (127-179)/(61-110) 149/85 mmHg (07/08 1000) SpO2:  [96 %-99 %] 98 % (07/08 1000) HEMODYNAMICS:   VENTILATOR SETTINGS:   INTAKE / OUTPUT: Intake/Output     07/07 0701 - 07/08 0700 07/08 0701 - 07/09 0700   P.O.  600   I.V. (mL/kg) 1284 (20) 200 (3.1)   Other     IV Piggyback     Total Intake(mL/kg) 1284 (20) 800 (12.5)   Urine (mL/kg/hr) 345 (0.2)    Emesis/NG output     Total Output 345     Net +939 +800        Urine Occurrence 3 x    Stool Occurrence 1 x      PHYSICAL EXAMINATION: General:  NAD Neuro:  A&Ox4, appropriate conversational skills, can follow commands HEENT:  MM moist and pink, appropriate phonation, PERLA Cardiovascular:  RRR Lungs:  CTA bilaterally Abdomen:  Soft, nontender to palpation Musculoskeletal:  Extremities atraumatic Skin:  No rashes noted  LABS:  Recent Labs Lab 04/12/13 1435  04/12/13 1441 04/12/13 1459 04/12/13 1613 04/12/13 1926 04/14/13 0410  HGB  --   --  18.1* 13.6  --   --  10.6*  WBC  --   --  5.9  --   --   --  16.8*  PLT  --   --  74*  --   --   --  218  NA  --   --  127* 132*  --   --  143  K  --   < > 3.4* 3.4*  --    --  2.8*  CL  --   --  95* 97  --   --  112  CO2  --   --  23  --   --   --  23  GLUCOSE  --   --  1019* >700*  --   --  86  BUN  --   --  20 24*  --   --  14  CREATININE  --   --  1.66* 1.60*  --   --  1.72*  CALCIUM  --   --  8.5  --   --   --  7.9*  AST  --   --  13  --   --   --   --   ALT  --   --  12  --   --   --   --   ALKPHOS  --   --  140*  --   --   --   --   BILITOT  --   --  0.3  --   --   --   --  PROT  --   --  6.8  --   --   --   --   ALBUMIN  --   --  3.5  --   --   --   --   APTT  --   --  28  --   --  26  --   INR  --   --  0.97  --   --  0.99  --   TROPONINI <0.30  --   --   --   --  <0.30  --   PHART  --   --   --   --  7.373  --   --   PCO2ART  --   --   --   --  40.1  --   --   PO2ART  --   --   --   --  333.0*  --   --   < > = values in this interval not displayed.  Recent Labs Lab 04/13/13 1609 04/13/13 2010 04/14/13 0050 04/14/13 0444 04/14/13 0817  GLUCAP 204* 202* 152* 78 140*    CXR 7/7 LLL atelectasis  ASSESSMENT / PLAN:  PULMONARY A:  Concern for aspiration during status epilepticus (per EDP, vomit suctioned) VDRF- resolved 7/7 P:   -D/c unasyn -no further CXRs unless change in fever/WBC curve or respiratory status  CARDIOVASCULAR A:  Hypertensive crisis - troponins negative P:  Cont Lopressor and hydralazine Consider starting primary antihypertensive before d/c F/u with PCP for BP Goal SBP 150-170s (25% reduction highest MAP / sys)  Nicotine patch as patient is smoker Consider Echo as outpatient once acute illness resolves  RENAL A:   AKI creat 1.66, last known prior was 1.0 P:   -SCr remains elevated -Bmet in am -D/c IV fluids  GASTROINTESTINAL A:  No acute issues P:   -D/c PPI -Transition to oral diabetic diet as tolerated  HEMATOLOGIC A:   Thrombocytopenia unclear etiology- resolved 7/8 DVT prevention P:  Lovenox for DVT prophylaxis  INFECTIOUS A:  Concern aspiration PNA (vomit suctioned in ED) P:   D/c  unasyn Monitor fever curve and WBC trends off abx Blood cultures negative to date HIV non-reactive  ENDOCRINE A:  Marked hyperglycemia (HONK), no anion gap, no acidosis on ABG-resolved 7/08 DM- A1C 13.3 P:   AC/HS  NEUROLOGIC A:   Status epilepticus-resolved 7/6 P:   Keppra per neuro MRI of brain per neuro Treat BP Psych consult for h/o schizophrenia/bipolar  TODAY'S SUMMARY:  Patient's BP acceptable. Will send to regular floor.  Will continue lopressor and hydralazine.  Patient will need to be transitioned to primary oral agent before d/c.  Suggest either diuretic or beta blocker as there is some component of renal insufficiency at this time.  A1C is 13.3 at this time.  Care management consulted.  Will need to follow up with PCP after acute illness resolves for management of diabetes and hypertension.  Psych consulted at request of family for history of schizophrenia/bipolar.  Will transfer patient to regular floor today and keep on our service as likely to be discharged in the near future.    Carley Hammed Physician Assistant Student  04/14/2013, 11:58 AM    I have interviewed and examined the patient and reviewed the database. I have formulated the assessment and plan as reflected in the note above with amendments made by me.   She has tolerated extubation well Neuro would like to obtain MRI and, if negative (  ie. No seizure focus), D/C anti-convulsants Her family notes that she refuses to take her meds including anti-psychotics There is no prior documented hx of DM but Hgb A1C > 13%!!! This will present a problem given hx of noncompliance Has moderate htn for which she has been prescribed meds in past but doesn't take  We have requested Psych eval to eval competency and  direct therapy. Family would like to meet with psychiatrist if possible to discuss options. Will continue PRN metoprolol and hydralazine for now. Probably should be discharged on an inexpensive  anti-hypertensive such as HCTZ Will start Lantus and Cont SSI for now until D/C plans are established. She is unlikely to comply with a regimen of insulin injections.   Billy Fischer, MD;  PCCM service; Mobile [phone]

## 2013-04-14 NOTE — Progress Notes (Signed)
Call made to [facility] to give report for transfer, unable to take report at present, frequent visits to bathroom, slightly unsteady gait, seems very inattentive, getting started booklet and diabetes kit for insulin injection given to patient, encourged to look over booklet, patient appears to have little interest in diabetes education, Berle Mull RN

## 2013-04-14 NOTE — Progress Notes (Signed)
Report given via telephone to [employer] on [place], to transfer to [facility] via wheelchair, Berle Mull RN

## 2013-04-14 NOTE — Progress Notes (Signed)
Subjective: No complaints. Patient has not had a recurrence of seizure activity. No significant overnight adverse clinical events reported.  Objective: Current vital signs: BP 137/68  Pulse 70  Temp(Src) 98.9 F (37.2 C) (Oral)  Resp 9  Ht 5\' 6"  (1.676 m)  Wt 64.1 kg (141 lb 5 oz)  BMI 22.82 kg/m2  SpO2 98%  Neurologic Exam: Alert and in no acute distress. Patient is well-oriented to time as well as place. Mental status was normal. Extraocular movements were full and conjugate. No facial weakness was noted. Strength and muscle tone normal throughout. Deep tendon reflexes were hypoactive and symmetrical. Coordination was normal.  Lab Results: Results for orders placed during the hospital encounter of 04/12/13 (from the past 48 hour(s))  TROPONIN I     Status: None   Collection Time    04/12/13  2:35 PM      Result Value Range   Troponin I <0.30  <0.30 ng/mL   Comment:            Due to the release kinetics of cTnI,     a negative result within the first hours     of the onset of symptoms does not rule out     myocardial infarction with certainty.     If myocardial infarction is still suspected,     repeat the test at appropriate intervals.  ETHANOL     Status: None   Collection Time    04/12/13  2:41 PM      Result Value Range   Alcohol, Ethyl (B) <11  0 - 11 mg/dL   Comment:            LOWEST DETECTABLE LIMIT FOR     SERUM ALCOHOL IS 11 mg/dL     FOR MEDICAL PURPOSES ONLY  PROTIME-INR     Status: None   Collection Time    04/12/13  2:41 PM      Result Value Range   Prothrombin Time 12.7  11.6 - 15.2 seconds   INR 0.97  0.00 - 1.49  APTT     Status: None   Collection Time    04/12/13  2:41 PM      Result Value Range   aPTT 28  24 - 37 seconds  CBC     Status: Abnormal   Collection Time    04/12/13  2:41 PM      Result Value Range   WBC 5.9  4.0 - 10.5 K/uL   RBC 6.95 (*) 3.87 - 5.11 MIL/uL   Hemoglobin 18.1 (*) 12.0 - 15.0 g/dL   HCT 45.4 (*) 09.8 - 11.9  %   MCV 78.6  78.0 - 100.0 fL   MCH 26.0  26.0 - 34.0 pg   MCHC 33.2  30.0 - 36.0 g/dL   RDW 14.7  82.9 - 56.2 %   Platelets 74 (*) 150 - 400 K/uL   Comment: PLATELET COUNT CONFIRMED BY SMEAR  DIFFERENTIAL     Status: None   Collection Time    04/12/13  2:41 PM      Result Value Range   Neutrophils Relative % 62  43 - 77 %   Lymphocytes Relative 31  12 - 46 %   Monocytes Relative 5  3 - 12 %   Eosinophils Relative 1  0 - 5 %   Basophils Relative 1  0 - 1 %   Neutro Abs 3.6  1.7 - 7.7 K/uL   Lymphs Abs 1.8  0.7 - 4.0 K/uL   Monocytes Absolute 0.3  0.1 - 1.0 K/uL   Eosinophils Absolute 0.1  0.0 - 0.7 K/uL   Basophils Absolute 0.1  0.0 - 0.1 K/uL   RBC Morphology ELLIPTOCYTES    COMPREHENSIVE METABOLIC PANEL     Status: Abnormal   Collection Time    04/12/13  2:41 PM      Result Value Range   Sodium 127 (*) 135 - 145 mEq/L   Potassium 3.4 (*) 3.5 - 5.1 mEq/L   Chloride 95 (*) 96 - 112 mEq/L   CO2 23  19 - 32 mEq/L   Glucose, Bld 1019 (*) 70 - 99 mg/dL   Comment: CRITICAL RESULT CALLED TO, READ BACK BY AND VERIFIED WITH:     T KIRICHENKO,PA AT 1604 04/12/13 BY ZPERRY.   BUN 20  6 - 23 mg/dL   Creatinine, Ser 1.61 (*) 0.50 - 1.10 mg/dL   Calcium 8.5  8.4 - 09.6 mg/dL   Total Protein 6.8  6.0 - 8.3 g/dL   Albumin 3.5  3.5 - 5.2 g/dL   AST 13  0 - 37 U/L   ALT 12  0 - 35 U/L   Alkaline Phosphatase 140 (*) 39 - 117 U/L   Total Bilirubin 0.3  0.3 - 1.2 mg/dL   GFR calc non Af Amer 33 (*) >90 mL/min   GFR calc Af Amer 38 (*) >90 mL/min   Comment:            The eGFR has been calculated     using the CKD EPI equation.     This calculation has not been     validated in all clinical     situations.     eGFR's persistently     <90 mL/min signify     possible Chronic Kidney Disease.  POCT I-STAT TROPONIN I     Status: None   Collection Time    04/12/13  2:57 PM      Result Value Range   Troponin i, poc 0.00  0.00 - 0.08 ng/mL   Comment 3            Comment: Due to the  release kinetics of cTnI,     a negative result within the first hours     of the onset of symptoms does not rule out     myocardial infarction with certainty.     If myocardial infarction is still suspected,     repeat the test at appropriate intervals.  POCT I-STAT, CHEM 8     Status: Abnormal   Collection Time    04/12/13  2:59 PM      Result Value Range   Sodium 132 (*) 135 - 145 mEq/L   Potassium 3.4 (*) 3.5 - 5.1 mEq/L   Chloride 97  96 - 112 mEq/L   BUN 24 (*) 6 - 23 mg/dL   Creatinine, Ser 0.45 (*) 0.50 - 1.10 mg/dL   Glucose, Bld >409 (*) 70 - 99 mg/dL   Calcium, Ion 8.11 (*) 1.12 - 1.23 mmol/L   TCO2 25  0 - 100 mmol/L   Hemoglobin 13.6  12.0 - 15.0 g/dL   HCT 91.4  78.2 - 95.6 %   Comment NOTIFIED PHYSICIAN    GLUCOSE, CAPILLARY     Status: Abnormal   Collection Time    04/12/13  3:13 PM      Result Value Range   Glucose-Capillary >600 (*) 70 - 99  mg/dL   Comment 1 Documented in Chart     Comment 2 Notify RN    URINE RAPID DRUG SCREEN (HOSP PERFORMED)     Status: Abnormal   Collection Time    04/12/13  3:30 PM      Result Value Range   Opiates NONE DETECTED  NONE DETECTED   Cocaine NONE DETECTED  NONE DETECTED   Benzodiazepines POSITIVE (*) NONE DETECTED   Amphetamines NONE DETECTED  NONE DETECTED   Tetrahydrocannabinol NONE DETECTED  NONE DETECTED   Barbiturates NONE DETECTED  NONE DETECTED   Comment:            DRUG SCREEN FOR MEDICAL PURPOSES     ONLY.  IF CONFIRMATION IS NEEDED     FOR ANY PURPOSE, NOTIFY LAB     WITHIN 5 DAYS.                LOWEST DETECTABLE LIMITS     FOR URINE DRUG SCREEN     Drug Class       Cutoff (ng/mL)     Amphetamine      1000     Barbiturate      200     Benzodiazepine   200     Tricyclics       300     Opiates          300     Cocaine          300     THC              50  URINALYSIS, ROUTINE W REFLEX MICROSCOPIC     Status: Abnormal   Collection Time    04/12/13  3:30 PM      Result Value Range   Color, Urine YELLOW   YELLOW   APPearance CLEAR  CLEAR   Specific Gravity, Urine 1.020  1.005 - 1.030   pH 6.5  5.0 - 8.0   Glucose, UA >1000 (*) NEGATIVE mg/dL   Hgb urine dipstick SMALL (*) NEGATIVE   Bilirubin Urine NEGATIVE  NEGATIVE   Ketones, ur NEGATIVE  NEGATIVE mg/dL   Protein, ur 161 (*) NEGATIVE mg/dL   Urobilinogen, UA 0.2  0.0 - 1.0 mg/dL   Nitrite NEGATIVE  NEGATIVE   Leukocytes, UA NEGATIVE  NEGATIVE  URINE MICROSCOPIC-ADD ON     Status: None   Collection Time    04/12/13  3:30 PM      Result Value Range   Squamous Epithelial / LPF RARE  RARE   WBC, UA 0-2  <3 WBC/hpf   RBC / HPF 0-2  <3 RBC/hpf   Bacteria, UA RARE  RARE  POCT I-STAT 3, BLOOD GAS (G3+)     Status: Abnormal   Collection Time    04/12/13  4:13 PM      Result Value Range   pH, Arterial 7.373  7.350 - 7.450   pCO2 arterial 40.1  35.0 - 45.0 mmHg   pO2, Arterial 333.0 (*) 80.0 - 100.0 mmHg   Bicarbonate 23.3  20.0 - 24.0 mEq/L   TCO2 25  0 - 100 mmol/L   O2 Saturation 100.0     Acid-base deficit 2.0  0.0 - 2.0 mmol/L   Patient temperature 98.7 F     Collection site RADIAL, ALLEN'S TEST ACCEPTABLE     Drawn by Operator     Sample type ARTERIAL    GLUCOSE, CAPILLARY     Status: Abnormal   Collection  Time    04/12/13  5:51 PM      Result Value Range   Glucose-Capillary >600 (*) 70 - 99 mg/dL  MRSA PCR SCREENING     Status: None   Collection Time    04/12/13  5:59 PM      Result Value Range   MRSA by PCR NEGATIVE  NEGATIVE   Comment:            The GeneXpert MRSA Assay (FDA     approved for NASAL specimens     only), is one component of a     comprehensive MRSA colonization     surveillance program. It is not     intended to diagnose MRSA     infection nor to guide or     monitor treatment for     MRSA infections.  GLUCOSE, CAPILLARY     Status: Abnormal   Collection Time    04/12/13  6:55 PM      Result Value Range   Glucose-Capillary 579 (*) 70 - 99 mg/dL   Comment 1 Documented in Chart    CULTURE,  BLOOD (ROUTINE X 2)     Status: None   Collection Time    04/12/13  7:25 PM      Result Value Range   Specimen Description BLOOD RIGHT ARM     Special Requests BOTTLES DRAWN AEROBIC ONLY 5CC     Culture  Setup Time 04/13/2013 02:16     Culture       Value:        BLOOD CULTURE RECEIVED NO GROWTH TO DATE CULTURE WILL BE HELD FOR 5 DAYS BEFORE ISSUING A FINAL NEGATIVE REPORT   Report Status PENDING    CULTURE, BLOOD (ROUTINE X 2)     Status: None   Collection Time    04/12/13  7:25 PM      Result Value Range   Specimen Description BLOOD RIGHT HAND     Special Requests BOTTLES DRAWN AEROBIC ONLY 3CC     Culture  Setup Time 04/13/2013 02:16     Culture       Value:        BLOOD CULTURE RECEIVED NO GROWTH TO DATE CULTURE WILL BE HELD FOR 5 DAYS BEFORE ISSUING A FINAL NEGATIVE REPORT   Report Status PENDING    APTT     Status: None   Collection Time    04/12/13  7:26 PM      Result Value Range   aPTT 26  24 - 37 seconds  PROTIME-INR     Status: None   Collection Time    04/12/13  7:26 PM      Result Value Range   Prothrombin Time 12.9  11.6 - 15.2 seconds   INR 0.99  0.00 - 1.49  TROPONIN I     Status: None   Collection Time    04/12/13  7:26 PM      Result Value Range   Troponin I <0.30  <0.30 ng/mL   Comment:            Due to the release kinetics of cTnI,     a negative result within the first hours     of the onset of symptoms does not rule out     myocardial infarction with certainty.     If myocardial infarction is still suspected,     repeat the test at appropriate intervals.  OSMOLALITY     Status: Abnormal  Collection Time    04/12/13  7:26 PM      Result Value Range   Osmolality 319 (*) 275 - 300 mOsm/kg  HIV ANTIBODY (ROUTINE TESTING)     Status: None   Collection Time    04/12/13  7:26 PM      Result Value Range   HIV NON REACTIVE  NON REACTIVE  GLUCOSE, CAPILLARY     Status: Abnormal   Collection Time    04/12/13  8:00 PM      Result Value Range    Glucose-Capillary 479 (*) 70 - 99 mg/dL  GLUCOSE, CAPILLARY     Status: Abnormal   Collection Time    04/12/13  8:53 PM      Result Value Range   Glucose-Capillary 416 (*) 70 - 99 mg/dL  GLUCOSE, CAPILLARY     Status: Abnormal   Collection Time    04/12/13  9:47 PM      Result Value Range   Glucose-Capillary 347 (*) 70 - 99 mg/dL  GLUCOSE, CAPILLARY     Status: Abnormal   Collection Time    04/12/13 11:00 PM      Result Value Range   Glucose-Capillary 250 (*) 70 - 99 mg/dL  GLUCOSE, CAPILLARY     Status: Abnormal   Collection Time    04/13/13 12:03 AM      Result Value Range   Glucose-Capillary 195 (*) 70 - 99 mg/dL  GLUCOSE, CAPILLARY     Status: Abnormal   Collection Time    04/13/13 12:56 AM      Result Value Range   Glucose-Capillary 126 (*) 70 - 99 mg/dL  GLUCOSE, CAPILLARY     Status: None   Collection Time    04/13/13  1:45 AM      Result Value Range   Glucose-Capillary 86  70 - 99 mg/dL  GLUCOSE, CAPILLARY     Status: Abnormal   Collection Time    04/13/13  2:43 AM      Result Value Range   Glucose-Capillary 53 (*) 70 - 99 mg/dL  GLUCOSE, CAPILLARY     Status: Abnormal   Collection Time    04/13/13  2:58 AM      Result Value Range   Glucose-Capillary 146 (*) 70 - 99 mg/dL  GLUCOSE, CAPILLARY     Status: None   Collection Time    04/13/13  4:03 AM      Result Value Range   Glucose-Capillary 75  70 - 99 mg/dL  GLUCOSE, CAPILLARY     Status: None   Collection Time    04/13/13  4:52 AM      Result Value Range   Glucose-Capillary 72  70 - 99 mg/dL  GLUCOSE, CAPILLARY     Status: None   Collection Time    04/13/13  6:01 AM      Result Value Range   Glucose-Capillary 78  70 - 99 mg/dL  GLUCOSE, CAPILLARY     Status: None   Collection Time    04/13/13  6:32 AM      Result Value Range   Glucose-Capillary 72  70 - 99 mg/dL  GLUCOSE, CAPILLARY     Status: Abnormal   Collection Time    04/13/13  7:51 AM      Result Value Range   Glucose-Capillary 111 (*)  70 - 99 mg/dL  GLUCOSE, CAPILLARY     Status: Abnormal   Collection Time    04/13/13 12:59 PM  Result Value Range   Glucose-Capillary 238 (*) 70 - 99 mg/dL  GLUCOSE, CAPILLARY     Status: Abnormal   Collection Time    04/13/13  4:09 PM      Result Value Range   Glucose-Capillary 204 (*) 70 - 99 mg/dL   Comment 1 Notify RN     Comment 2 Documented in Chart    GLUCOSE, CAPILLARY     Status: Abnormal   Collection Time    04/13/13  8:10 PM      Result Value Range   Glucose-Capillary 202 (*) 70 - 99 mg/dL   Comment 1 Notify RN     Comment 2 Documented in Chart    GLUCOSE, CAPILLARY     Status: Abnormal   Collection Time    04/14/13 12:50 AM      Result Value Range   Glucose-Capillary 152 (*) 70 - 99 mg/dL  BASIC METABOLIC PANEL     Status: Abnormal   Collection Time    04/14/13  4:10 AM      Result Value Range   Sodium 143  135 - 145 mEq/L   Comment: DELTA CHECK NOTED   Potassium 2.8 (*) 3.5 - 5.1 mEq/L   Comment: DELTA CHECK NOTED   Chloride 112  96 - 112 mEq/L   Comment: DELTA CHECK NOTED   CO2 23  19 - 32 mEq/L   Glucose, Bld 86  70 - 99 mg/dL   BUN 14  6 - 23 mg/dL   Creatinine, Ser 4.54 (*) 0.50 - 1.10 mg/dL   Calcium 7.9 (*) 8.4 - 10.5 mg/dL   GFR calc non Af Amer 31 (*) >90 mL/min   GFR calc Af Amer 36 (*) >90 mL/min   Comment:            The eGFR has been calculated     using the CKD EPI equation.     This calculation has not been     validated in all clinical     situations.     eGFR's persistently     <90 mL/min signify     possible Chronic Kidney Disease.  CBC     Status: Abnormal   Collection Time    04/14/13  4:10 AM      Result Value Range   WBC 16.8 (*) 4.0 - 10.5 K/uL   RBC 4.13  3.87 - 5.11 MIL/uL   Hemoglobin 10.6 (*) 12.0 - 15.0 g/dL   Comment: DELTA CHECK NOTED     REPEATED TO VERIFY   HCT 31.9 (*) 36.0 - 46.0 %   MCV 77.2 (*) 78.0 - 100.0 fL   MCH 25.7 (*) 26.0 - 34.0 pg   MCHC 33.2  30.0 - 36.0 g/dL   RDW 09.8  11.9 - 14.7 %    Platelets 218  150 - 400 K/uL   Comment: DELTA CHECK NOTED     REPEATED TO VERIFY  GLUCOSE, CAPILLARY     Status: None   Collection Time    04/14/13  4:44 AM      Result Value Range   Glucose-Capillary 78  70 - 99 mg/dL    Studies/Results: Ct Head Wo Contrast  04/12/2013   *RADIOLOGY REPORT*  Clinical Data: Status epilepticus, code stroke  CT HEAD WITHOUT CONTRAST  Technique:  Contiguous axial images were obtained from the base of the skull through the vertex without contrast.  Comparison: None.  Findings: No acute intracranial hemorrhage, acute infarction, mass lesion, mass  effect, midline shift or hydrocephalus.  Gray-white differentiation is preserved throughout.  The globes are intact bilaterally.  Symmetric and unremarkable orbits.  No focal soft tissue abnormality.  Normal aeration of the mastoid air cells and visualized paranasal sinuses.  No focal calvarial abnormality.  IMPRESSION:  Negative head CT.  These results were called by telephone on 04/12/2013 at 03:04 p.m. to Dr. Roseanne Reno, who verbally acknowledged these results.   Original Report Authenticated By: Malachy Moan, M.D.   Dg Chest Port 1 View  04/13/2013   *RADIOLOGY REPORT*  Clinical Data: Endotracheal tube positioning  PORTABLE CHEST - 1 VIEW  Comparison: 04/12/2013  Findings: Endotracheal tube tip is 5.3 cm above the carina. Nasogastric tube tip in the stomach.  There is new mild airspace opacity along the left hemidiaphragm favoring atelectasis over early pneumonia.  The lungs appear otherwise clear.  Borderline cardiomegaly, cardiothoracic index 58%.  IMPRESSION:  1.  Mildly increased airspace opacity along the left hemidiaphragm, favoring atelectasis. 2.  Borderline cardiomegaly.   Original Report Authenticated By: Gaylyn Rong, M.D.   Dg Chest Port 1 View  04/12/2013   *RADIOLOGY REPORT*  Clinical Data: Status epilepticus.  Code stroke. New intubation.  PORTABLE CHEST - 1 VIEW  Comparison: Chest x-ray dated 06/27/2007   Findings: Endotracheal tube is at the thoracic inlet in good position.  NG tube has been inserted and the tip is at the gastroesophageal junction and needs to be advanced.  Heart size and vascularity are normal and the lungs are clear.  No osseous abnormality.  IMPRESSION:  1.  Endotracheal tube in good position. 2.  NG tube is at the gastroesophageal junction and needs to be advanced. 3.  No acute abnormalities in the chest.   Original Report Authenticated By: Francene Boyers, M.D.    Medications: I have reviewed the patient's current medications.  Assessment/Plan: Encephalopathy likely secondary to hyperosmolar state and hypertensive crisis, clinically resolved. No focal deficits noted on exam, including no signs of acute stroke.  Recommend MRI of the brain without contrast. If stroke is demonstrated, thenstroke risk assessment will need to be initiated. EEG showed no epileptiform activity. Keppra can be discontinued if MRI study shows no acute structural intracranial abnormality.  We will continue to follow this patient with you.   C.R. Roseanne Reno, MD [facility] [phone]   04/14/2013  8:26 AM

## 2013-04-14 NOTE — Progress Notes (Signed)
[facility] ADULT ICU REPLACEMENT PROTOCOL FOR AM LAB REPLACEMENT ONLY  The patient does not apply for the [facility] Adult ICU Electrolyte Replacment Protocol based on the criteria listed below:   1. Is GFR >/= 40 ml/min? no  Patient's GFR today is 36  4. Abnormal electrolyte(s): K+2.8  5. Ordered repletion with: . If a panic level lab has been reported, has the CCM MD in charge been notified? yes.   Physician:  Dr. Josefa Half, Jaysion Ramseyer A 04/14/2013 6:35 AM

## 2013-04-14 NOTE — Progress Notes (Addendum)
Inpatient Diabetes Program Recommendations  AACE/ADA: New Consensus Statement on Inpatient Glycemic Control (2013)  Target Ranges:  Prepandial:   less than 140 mg/dL      Peak postprandial:   less than 180 mg/dL (1-2 hours)      Critically ill patients:  140 - 180 mg/dL   Reason for Visit: Results for JILLIANNA, STANEK (MRN [id number]) as of 04/14/2013 12:39  Ref. Range 04/13/2013 16:09 04/13/2013 20:10 04/14/2013 00:50 04/14/2013 04:44 04/14/2013 08:17  Glucose-Capillary Latest Range: 70-99 mg/dL 130 (H) 865 (H) 784 (H) 78 140 (H)   CBG's improved today.  A1C=13.3% indicating average CBG's 339 mg/dL prior to hospitalization.  Patient will likely need insulin at discharge and follow-up education.  No PCP documented and unsure whether patient has Rx. coverage.  Please consider adding Novolog meal coverage 3 units tid with meals.    1300 Spoke briefly to patient.  She states that she thinks she had high blood sugars in the past and took a "pill" for it. She does not have a PCP and has not seen a MD in a long time.  Discussed A1C and her likely need for insulin at discharge.  She states "I just want to take a pill".  Explained that oral medication is likely not enough since her CBG's have been so high. Ordered diabetes videos, nutrition consult, "living well with diabetes" book, and insulin teaching by bedside RN.  Patient is going to need lots of support in order to manage her diabetes.  Will have diabetes coordinator follow-up on 04/15/13.

## 2013-04-14 NOTE — Progress Notes (Signed)
Nutrition Consult  Received nutrition consult for new onset diabetes. Patient reports that she used to take pills for her diabetes, but has not taken any pills in a long time. Also does not check blood sugar at home. When reviewing foods that contain carbohydrates, patient says that she feels "drugged" and "can't think straight" at this time. Patient is not appropriate for education at this time. Provided "CHO-counting for diabetes" handout from the [employer]. Patient to review information when she feels better. RD to follow-up at a later date to discuss information with patient.  Joaquin Courts, RD, LDN, CNSC Pager [phone] After Hours Pager [phone]

## 2013-04-15 ENCOUNTER — Encounter (HOSPITAL_COMMUNITY): Payer: Self-pay | Admitting: Psychiatry

## 2013-04-15 DIAGNOSIS — F209 Schizophrenia, unspecified: Secondary | ICD-10-CM

## 2013-04-15 LAB — GLUCOSE, CAPILLARY
Glucose-Capillary: 147 mg/dL — ABNORMAL HIGH (ref 70–99)
Glucose-Capillary: 238 mg/dL — ABNORMAL HIGH (ref 70–99)
Glucose-Capillary: 143 mg/dL — ABNORMAL HIGH (ref 70–99)

## 2013-04-15 LAB — BASIC METABOLIC PANEL
CO2: 20 mEq/L (ref 19–32)
Chloride: 113 mEq/L — ABNORMAL HIGH (ref 96–112)
Glucose, Bld: 147 mg/dL — ABNORMAL HIGH (ref 70–99)
Sodium: 141 mEq/L (ref 135–145)

## 2013-04-15 MED ORDER — INSULIN ASPART PROT & ASPART (70-30 MIX) 100 UNIT/ML ~~LOC~~ SUSP
18.0000 [IU] | Freq: Two times a day (BID) | SUBCUTANEOUS | Status: DC
  Administered 2013-04-15 – 2013-04-16 (×2): 18 [IU] via SUBCUTANEOUS
  Filled 2013-04-15: qty 10

## 2013-04-15 MED ORDER — NON FORMULARY: 18.0000 [IU] | Freq: Two times a day (BID) | Status: DC

## 2013-04-15 MED ORDER — METOPROLOL TARTRATE 25 MG PO TABS
25.0000 mg | ORAL_TABLET | Freq: Two times a day (BID) | ORAL | Status: DC
  Administered 2013-04-15 – 2013-04-16 (×3): 25 mg via ORAL
  Filled 2013-04-15 (×4): qty 1

## 2013-04-15 MED ORDER — INSULIN ASPART 100 UNIT/ML ~~LOC~~ SOLN
0.0000 [IU] | Freq: Three times a day (TID) | SUBCUTANEOUS | Status: DC
  Administered 2013-04-15: 1 [IU] via SUBCUTANEOUS

## 2013-04-15 MED ORDER — INSULIN NPH ISOPHANE & REGULAR (70-30) 100 UNIT/ML ~~LOC~~ SUSP: 18.0000 [IU] | Freq: Two times a day (BID) | SUBCUTANEOUS | Status: DC

## 2013-04-15 MED ORDER — POTASSIUM CHLORIDE CRYS ER 20 MEQ PO TBCR
40.0000 meq | EXTENDED_RELEASE_TABLET | Freq: Once | ORAL | Status: AC
  Administered 2013-04-15: 40 meq via ORAL
  Filled 2013-04-15: qty 2

## 2013-04-15 NOTE — Progress Notes (Signed)
Subjective: Patient states that she feels back to her normal self.   She denies any history of staring spells, lost time, episodes of confusion or seizures.   Exam: Filed Vitals:   04/15/13 0650  BP: 164/74  Pulse:   Temp:   Resp:    Gen: In bed, NAD MS: Awake, alert, oriented to person and place.  HQ:IONGE, VFF, EOMI, Face symmetric Motor: full strength bialterally Sensory:intact to LT in arms and legs.   Impression: 57 yo F with single seizure in the setting of blood glucose > 1000. Given that this seizure was likely provoked by the hyperosmolar state, I do not feel that continued seizure therapy is indicated. 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   If she were to have further seizures, then AED therapy would be considered at that time.   Recommendations: 1) Will discontinue keppra at this time 2) If patient were to have unprovoked seizures in the future, would likely need AED therapy 3) No further recommendations at this time, Neurology will sign off, please call with any further questions.   Ritta Slot, MD [facility] [phone]  If 7pm- 7am, please page neurology on call at [phone].

## 2013-04-15 NOTE — Progress Notes (Signed)
Pt now watching video 506 for diabetes teaching

## 2013-04-15 NOTE — Progress Notes (Addendum)
Inpatient Diabetes Program Recommendations  AACE/ADA: New Consensus Statement on Inpatient Glycemic Control (2013)  Target Ranges:  Prepandial:   less than 140 mg/dL      Peak postprandial:   less than 180 mg/dL (1-2 hours)      Critically ill patients:  140 - 180 mg/dL   Results for JESICA, GOHEEN (MRN [id number]) as of 04/15/2013 11:22  Ref. Range 04/14/2013 08:17 04/14/2013 12:37 04/14/2013 17:27 04/14/2013 20:48 04/15/2013 08:12  Glucose-Capillary Latest Range: 70-99 mg/dL 409 (H) 811 (H) 914 (H) 159 (H) 147 (H)    Inpatient Diabetes Program Recommendations Insulin - Basal: May want to consider changing Lantus to 10 units BID or 20 units QHS.  Note: In reviewing the chart, noted that patient was initially on an insulin drip and was transitioned to SQ insulin on 04/13/13 and received Lantus 10 units at 16:34 on 7/7.  On 04/14/13 patient received Lantus 10 units at 10:16 am and Lantus 10 units at 22:41.  Therefore, patient received a total of 20 units of Lantus on 04/14/13 and blood glucose has improved with fasting blood glucose of 147 mg/dl this morning.  May want to consider changing Lantus to 10 units BID or 20 units QHS while inpatient.   Have noted in the chart that patient has not taken any medication since 2008 and she reports being on an oral agent in the past for diabetes.  Patient has no insurance and there is a concern of compliance with insulin injections.    MD - Please make notation in progress note as to the outpatient plan for diabetes management.  If insulin will be prescribed at discharge will need to use Novolin R, Novolin N, or Novolin 70/30 which can be purchased at [facility] for $25 per vial. The equivalent of Lantus 20 units daily would be NPH 13 units BID or 70/30 18 units BID.     NURSING:  Please being to educate patient on diabetes and importance of glucose control to prevent acute and long term complications.  Also, please have patient watch diabetes patient education videos as  ordered.   Thanks, Orlando Penner, RN, MSN, CCRN Diabetes Coordinator Inpatient Diabetes Program [phone]

## 2013-04-15 NOTE — Progress Notes (Signed)
Addendum  Patient seen and examined, chart and data base reviewed.  I agree with the above assessment and plan.  For full details please see Mrs. Algis Downs PA note.  Hyperosmolar hyperglycemic state, status epilepticus and hypertension.   Clint Lipps, MD [facility] Pager: [phone] 04/15/2013, 1:00 PM

## 2013-04-15 NOTE — Progress Notes (Signed)
PULMONARY  / CRITICAL CARE MEDICINE  Name: Jane Higgins MRN: [id number] DOB: 01-05-1956    ADMISSION DATE:  04/12/2013 CONSULTATION DATE:  04/12/2013  REFERRING MD :  EDP Dr. Fonnie Jarvis PRIMARY SERVICE: CCM  CHIEF COMPLAINT:  Seizure, hyperglycemia  BRIEF PATIENT DESCRIPTION: 57 yo F with status epilepticus, marked hypertension SBP >250, hyperglycemia with glucose >1000.  SIGNIFICANT EVENTS / STUDIES:  7/6 >>> admit, gluc 1000, status epilepticus, SBP> 250, intubated  LINES / TUBES: ETT 7/6>>>7/7 PIV x 3  CULTURES: 7/6 blood >>> NGTD  ANTIBIOTICS: unasyn 7/6 >>> 7/7  SUBJECTIVE:  Afebrile, sitting in bed, wants to go home  VITAL SIGNS: Temp:  [98.5 F (36.9 C)-99.3 F (37.4 C)] 99.3 F (37.4 C) (07/09 1441) Pulse Rate:  [76-83] 76 (07/09 1441) Resp:  [18-20] 18 (07/09 1441) BP: (158-186)/(74-97) 186/97 mmHg (07/09 1441) SpO2:  [96 %-99 %] 97 % (07/09 1441) HEMODYNAMICS:   VENTILATOR SETTINGS:   INTAKE / OUTPUT: Intake/Output     07/08 0701 - 07/09 0700 07/09 0701 - 07/10 0700   P.O. 960 240   I.V. (mL/kg) 200 (3.1)    Total Intake(mL/kg) 1160 (18.1) 240 (3.7)   Urine (mL/kg/hr)     Total Output       Net +1160 +240        Urine Occurrence 6 x 1 x   Stool Occurrence 1 x      PHYSICAL EXAMINATION: General:  NAD Neuro:  A&Ox4, appropriate conversational skills, can follow commands HEENT:  MM moist and pink, appropriate phonation, PERLA Cardiovascular:  RRR Lungs:  CTA bilaterally Abdomen:  Soft, nontender to palpation Musculoskeletal:  Extremities atraumatic Skin:  No rashes noted  LABS:  Recent Labs Lab 04/12/13 1435  04/12/13 1441 04/12/13 1459 04/12/13 1613 04/12/13 1926 04/14/13 0410 04/15/13 0554  HGB  --   --  18.1* 13.6  --   --  10.6*  --   WBC  --   --  5.9  --   --   --  16.8*  --   PLT  --   --  74*  --   --   --  218  --   NA  --   < > 127* 132*  --   --  143 141  K  --   < > 3.4* 3.4*  --   --  2.8* 3.2*  CL  --   < > 95* 97  --    --  112 113*  CO2  --   --  23  --   --   --  23 20  GLUCOSE  --   < > 1019* >700*  --   --  86 147*  BUN  --   < > 20 24*  --   --  14 12  CREATININE  --   < > 1.66* 1.60*  --   --  1.72* 1.83*  CALCIUM  --   --  8.5  --   --   --  7.9* 8.3*  AST  --   --  13  --   --   --   --   --   ALT  --   --  12  --   --   --   --   --   ALKPHOS  --   --  140*  --   --   --   --   --   BILITOT  --   --  0.3  --   --   --   --   --   PROT  --   --  6.8  --   --   --   --   --   ALBUMIN  --   --  3.5  --   --   --   --   --   APTT  --   --  28  --   --  26  --   --   INR  --   --  0.97  --   --  0.99  --   --   TROPONINI <0.30  --   --   --   --  <0.30  --   --   PHART  --   --   --   --  7.373  --   --   --   PCO2ART  --   --   --   --  40.1  --   --   --   PO2ART  --   --   --   --  333.0*  --   --   --   < > = values in this interval not displayed.  Recent Labs Lab 04/14/13 1237 04/14/13 1727 04/14/13 2048 04/15/13 0812 04/15/13 1142  GLUCAP 264* 195* 159* 147* 238*    CXR 7/7 LLL atelectasis  ASSESSMENT / PLAN:  PULMONARY A:  Concern for aspiration during status epilepticus (per EDP, vomit suctioned) VDRF- resolved 7/7 P:   -D/c unasyn -no further CXRs unless change in fever/WBC curve or respiratory status  CARDIOVASCULAR A:  Hypertensive crisis - troponins negative P:  PO lopressor now, titrate to effect Goal SBP 150-170s (25% reduction highest MAP / sys)  Nicotine patch as patient is smoker   RENAL A:   AKI , last known prior was 1.0 P:   -SCr remains elevated at 1.8   GASTROINTESTINAL A:  No acute issues P:   -D/c PPI -Transition to oral diabetic diet as tolerated  HEMATOLOGIC A:   Thrombocytopenia unclear etiology- resolved 7/8 DVT prevention P:  Lovenox for DVT prophylaxis  INFECTIOUS A:  Concern aspiration PNA (vomit suctioned in ED) P:   D/c unasyn Monitor fever curve and WBC trends off abx Blood cultures negative to date HIV  non-reactive  ENDOCRINE A:  Marked hyperglycemia (HONK), no anion gap, no acidosis on ABG-resolved 7/08 DM- A1C 13.3 P:   AC/HS, seen by diabetes RN Is insulin the best option here given her psych issues?  NEUROLOGIC A:   Status epilepticus-resolved 7/6 P:   Dc Keppra per neuro MRI of brain per neuro Treat BP Psych consult for h/o schizophrenia/bipolar -Her family notes that she refuses to take her meds including anti-psychotics, this will be the most important risk factor for readmission  TODAY'S SUMMARY:    A1C is 13.3 at this time.  Care management consulted.  Will need to follow up with PCP after acute illness resolves for management of diabetes and hypertension.  Psych consulted at request of family for history of schizophrenia/bipolar.  pCCM to [facility] [phone]  04/15/2013, 4:07 PM

## 2013-04-15 NOTE — Consult Note (Addendum)
[facility] Psychiatry Consult   Reason for Consult:  Medical Non-compliance/Schizophrenia Referring Physician:  Dr. [name] is an 57 y.o. female.  Assessment: AXIS I:  Schizophrenia Undifferentiated Disorder AXIS II:  Deferred AXIS III:   Past Medical History  Diagnosis Date  . Bipolar 1 disorder    AXIS IV:  economic problems, educational problems, occupational problems, other psychosocial or environmental problems and problems with access to health care services AXIS V:  51-60 moderate symptoms  Plan:  No evidence of imminent risk to self or others at present.   Patient does not meet criteria for psychiatric inpatient admission.  Subjective:   Jane Higgins is a 57 y.o. female patient admitted with history of Schizophrenia.  She presents with blunted affect and level mood. She was seen for years by [facility] but has not presented there since change in their management two years ago.  She is alert and cooperative, lacks insight and denies any need for mental health treatment.    Past Psychiatric History: Past Medical History  Diagnosis Date  . Bipolar 1 disorder     reports that she has been smoking Cigarettes.  She has been smoking about 1.00 pack per day. She does not have any smokeless tobacco history on file. She reports that  drinks alcohol. She reports that she does not use illicit drugs. History reviewed. No pertinent family history.   Living Arrangements: Children   Abuse/Neglect [facility]) Physical Abuse: Denies Verbal Abuse: Denies Sexual Abuse: Denies Allergies:  No Known Allergies  Past Psychiatric History: Diagnosis:  Schizophenia  Hospitalizations:  None per patient  Outpatient Care:  Dr. Hortencia Pilar  Substance Abuse Care:  denies  Self-Mutilation:  denies  Suicidal Attempts:  denies  Violent Behaviors:  none   Objective: Blood pressure 186/97, pulse 76, temperature 99.3 F (37.4 C), temperature source Oral, resp. rate 18,  height 5\' 6"  (1.676 m), weight 141 lb 5 oz (64.1 kg), SpO2 97.00%.Body mass index is 22.82 kg/(m^2). Results for orders placed during the hospital encounter of 04/12/13 (from the past 72 hour(s))  GLUCOSE, CAPILLARY     Status: Abnormal   Collection Time    04/12/13  6:55 PM      Result Value Range   Glucose-Capillary 579 (*) 70 - 99 mg/dL   Comment 1 Documented in Chart    CULTURE, BLOOD (ROUTINE X 2)     Status: None   Collection Time    04/12/13  7:25 PM      Result Value Range   Specimen Description BLOOD RIGHT ARM     Special Requests BOTTLES DRAWN AEROBIC ONLY 5CC     Culture  Setup Time 04/13/2013 02:16     Culture       Value:        BLOOD CULTURE RECEIVED NO GROWTH TO DATE CULTURE WILL BE HELD FOR 5 DAYS BEFORE ISSUING A FINAL NEGATIVE REPORT   Report Status PENDING    CULTURE, BLOOD (ROUTINE X 2)     Status: None   Collection Time    04/12/13  7:25 PM      Result Value Range   Specimen Description BLOOD RIGHT HAND     Special Requests BOTTLES DRAWN AEROBIC ONLY 3CC     Culture  Setup Time 04/13/2013 02:16     Culture       Value:        BLOOD CULTURE RECEIVED NO GROWTH TO DATE CULTURE WILL BE HELD FOR  5 DAYS BEFORE ISSUING A FINAL NEGATIVE REPORT   Report Status PENDING    APTT     Status: None   Collection Time    04/12/13  7:26 PM      Result Value Range   aPTT 26  24 - 37 seconds  PROTIME-INR     Status: None   Collection Time    04/12/13  7:26 PM      Result Value Range   Prothrombin Time 12.9  11.6 - 15.2 seconds   INR 0.99  0.00 - 1.49  TROPONIN I     Status: None   Collection Time    04/12/13  7:26 PM      Result Value Range   Troponin I <0.30  <0.30 ng/mL   Comment:            Due to the release kinetics of cTnI,     a negative result within the first hours     of the onset of symptoms does not rule out     myocardial infarction with certainty.     If myocardial infarction is still suspected,     repeat the test at appropriate intervals.   OSMOLALITY     Status: Abnormal   Collection Time    04/12/13  7:26 PM      Result Value Range   Osmolality 319 (*) 275 - 300 mOsm/kg  HIV ANTIBODY (ROUTINE TESTING)     Status: None   Collection Time    04/12/13  7:26 PM      Result Value Range   HIV NON REACTIVE  NON REACTIVE  GLUCOSE, CAPILLARY     Status: Abnormal   Collection Time    04/12/13  8:00 PM      Result Value Range   Glucose-Capillary 479 (*) 70 - 99 mg/dL  GLUCOSE, CAPILLARY     Status: Abnormal   Collection Time    04/12/13  8:53 PM      Result Value Range   Glucose-Capillary 416 (*) 70 - 99 mg/dL  GLUCOSE, CAPILLARY     Status: Abnormal   Collection Time    04/12/13  9:47 PM      Result Value Range   Glucose-Capillary 347 (*) 70 - 99 mg/dL  GLUCOSE, CAPILLARY     Status: Abnormal   Collection Time    04/12/13 11:00 PM      Result Value Range   Glucose-Capillary 250 (*) 70 - 99 mg/dL  GLUCOSE, CAPILLARY     Status: Abnormal   Collection Time    04/13/13 12:03 AM      Result Value Range   Glucose-Capillary 195 (*) 70 - 99 mg/dL  GLUCOSE, CAPILLARY     Status: Abnormal   Collection Time    04/13/13 12:56 AM      Result Value Range   Glucose-Capillary 126 (*) 70 - 99 mg/dL  GLUCOSE, CAPILLARY     Status: None   Collection Time    04/13/13  1:45 AM      Result Value Range   Glucose-Capillary 86  70 - 99 mg/dL  GLUCOSE, CAPILLARY     Status: Abnormal   Collection Time    04/13/13  2:43 AM      Result Value Range   Glucose-Capillary 53 (*) 70 - 99 mg/dL  GLUCOSE, CAPILLARY     Status: Abnormal   Collection Time    04/13/13  2:58 AM      Result Value  Range   Glucose-Capillary 146 (*) 70 - 99 mg/dL  GLUCOSE, CAPILLARY     Status: None   Collection Time    04/13/13  4:03 AM      Result Value Range   Glucose-Capillary 75  70 - 99 mg/dL  GLUCOSE, CAPILLARY     Status: None   Collection Time    04/13/13  4:52 AM      Result Value Range   Glucose-Capillary 72  70 - 99 mg/dL  GLUCOSE, CAPILLARY      Status: None   Collection Time    04/13/13  6:01 AM      Result Value Range   Glucose-Capillary 78  70 - 99 mg/dL  GLUCOSE, CAPILLARY     Status: None   Collection Time    04/13/13  6:32 AM      Result Value Range   Glucose-Capillary 72  70 - 99 mg/dL  GLUCOSE, CAPILLARY     Status: Abnormal   Collection Time    04/13/13  7:51 AM      Result Value Range   Glucose-Capillary 111 (*) 70 - 99 mg/dL  GLUCOSE, CAPILLARY     Status: Abnormal   Collection Time    04/13/13 12:59 PM      Result Value Range   Glucose-Capillary 238 (*) 70 - 99 mg/dL  GLUCOSE, CAPILLARY     Status: Abnormal   Collection Time    04/13/13  4:09 PM      Result Value Range   Glucose-Capillary 204 (*) 70 - 99 mg/dL   Comment 1 Notify RN     Comment 2 Documented in Chart    GLUCOSE, CAPILLARY     Status: Abnormal   Collection Time    04/13/13  8:10 PM      Result Value Range   Glucose-Capillary 202 (*) 70 - 99 mg/dL   Comment 1 Notify RN     Comment 2 Documented in Chart    GLUCOSE, CAPILLARY     Status: Abnormal   Collection Time    04/14/13 12:50 AM      Result Value Range   Glucose-Capillary 152 (*) 70 - 99 mg/dL  BASIC METABOLIC PANEL     Status: Abnormal   Collection Time    04/14/13  4:10 AM      Result Value Range   Sodium 143  135 - 145 mEq/L   Comment: DELTA CHECK NOTED   Potassium 2.8 (*) 3.5 - 5.1 mEq/L   Comment: DELTA CHECK NOTED   Chloride 112  96 - 112 mEq/L   Comment: DELTA CHECK NOTED   CO2 23  19 - 32 mEq/L   Glucose, Bld 86  70 - 99 mg/dL   BUN 14  6 - 23 mg/dL   Creatinine, Ser 1.47 (*) 0.50 - 1.10 mg/dL   Calcium 7.9 (*) 8.4 - 10.5 mg/dL   GFR calc non Af Amer 31 (*) >90 mL/min   GFR calc Af Amer 36 (*) >90 mL/min   Comment:            The eGFR has been calculated     using the CKD EPI equation.     This calculation has not been     validated in all clinical     situations.     eGFR's persistently     <90 mL/min signify     possible Chronic Kidney Disease.  CBC      Status: Abnormal  Collection Time    04/14/13  4:10 AM      Result Value Range   WBC 16.8 (*) 4.0 - 10.5 K/uL   RBC 4.13  3.87 - 5.11 MIL/uL   Hemoglobin 10.6 (*) 12.0 - 15.0 g/dL   Comment: DELTA CHECK NOTED     REPEATED TO VERIFY   HCT 31.9 (*) 36.0 - 46.0 %   MCV 77.2 (*) 78.0 - 100.0 fL   MCH 25.7 (*) 26.0 - 34.0 pg   MCHC 33.2  30.0 - 36.0 g/dL   RDW 16.1  09.6 - 04.5 %   Platelets 218  150 - 400 K/uL   Comment: DELTA CHECK NOTED     REPEATED TO VERIFY  HEMOGLOBIN A1C     Status: Abnormal   Collection Time    04/14/13  4:10 AM      Result Value Range   Hemoglobin A1C 13.3 (*) <5.7 %   Comment: (NOTE)                                                                               According to the ADA Clinical Practice Recommendations for 2011, when     HbA1c is used as a screening test:      >=6.5%   Diagnostic of Diabetes Mellitus               (if abnormal result is confirmed)     5.7-6.4%   Increased risk of developing Diabetes Mellitus     References:Diagnosis and Classification of Diabetes Mellitus,Diabetes     Care,2011,34(Suppl 1):S62-S69 and Standards of Medical Care in             Diabetes - 2011,Diabetes Care,2011,34 (Suppl 1):S11-S61.   Mean Plasma Glucose 335 (*) <117 mg/dL  GLUCOSE, CAPILLARY     Status: None   Collection Time    04/14/13  4:44 AM      Result Value Range   Glucose-Capillary 78  70 - 99 mg/dL  GLUCOSE, CAPILLARY     Status: Abnormal   Collection Time    04/14/13  8:17 AM      Result Value Range   Glucose-Capillary 140 (*) 70 - 99 mg/dL  GLUCOSE, CAPILLARY     Status: Abnormal   Collection Time    04/14/13 12:37 PM      Result Value Range   Glucose-Capillary 264 (*) 70 - 99 mg/dL  GLUCOSE, CAPILLARY     Status: Abnormal   Collection Time    04/14/13  5:27 PM      Result Value Range   Glucose-Capillary 195 (*) 70 - 99 mg/dL  GLUCOSE, CAPILLARY     Status: Abnormal   Collection Time    04/14/13  8:48 PM      Result Value Range    Glucose-Capillary 159 (*) 70 - 99 mg/dL   Comment 1 Documented in Chart     Comment 2 Notify RN    BASIC METABOLIC PANEL     Status: Abnormal   Collection Time    04/15/13  5:54 AM      Result Value Range   Sodium 141  135 - 145 mEq/L   Potassium 3.2 (*)  3.5 - 5.1 mEq/L   Chloride 113 (*) 96 - 112 mEq/L   CO2 20  19 - 32 mEq/L   Glucose, Bld 147 (*) 70 - 99 mg/dL   BUN 12  6 - 23 mg/dL   Creatinine, Ser 1.61 (*) 0.50 - 1.10 mg/dL   Calcium 8.3 (*) 8.4 - 10.5 mg/dL   GFR calc non Af Amer 29 (*) >90 mL/min   GFR calc Af Amer 34 (*) >90 mL/min   Comment:            The eGFR has been calculated     using the CKD EPI equation.     This calculation has not been     validated in all clinical     situations.     eGFR's persistently     <90 mL/min signify     possible Chronic Kidney Disease.  GLUCOSE, CAPILLARY     Status: Abnormal   Collection Time    04/15/13  8:12 AM      Result Value Range   Glucose-Capillary 147 (*) 70 - 99 mg/dL  GLUCOSE, CAPILLARY     Status: Abnormal   Collection Time    04/15/13 11:42 AM      Result Value Range   Glucose-Capillary 238 (*) 70 - 99 mg/dL   Labs are reviewed and are pertinent for se below.  Current Facility-Administered Medications  Medication Dose Route Frequency Provider Last Rate Last Dose  . enoxaparin (LOVENOX) injection 40 mg  40 mg Subcutaneous Q24H Merwyn Katos, MD   40 mg at 04/15/13 1121  . fentaNYL (SUBLIMAZE) injection 12.5-25 mcg  12.5-25 mcg Intravenous Q2H PRN Merwyn Katos, MD      . insulin aspart (novoLOG) injection 0-9 Units  0-9 Units Subcutaneous TID WC Marianne L York, PA-C      . insulin aspart protamine- aspart (NOVOLOG MIX 70/30) injection 18 Units  18 Units Subcutaneous BID WC Clydia Llano, MD      . LORazepam (ATIVAN) injection 1-2 mg  1-2 mg Intravenous Q2H PRN Nelda Bucks, MD      . metoprolol tartrate (LOPRESSOR) tablet 25 mg  25 mg Oral BID Stephani Police, PA-C   25 mg at 04/15/13 1502  .  nicotine (NICODERM CQ - dosed in mg/24 hours) patch 21 mg  21 mg Transdermal Daily Merwyn Katos, MD   21 mg at 04/15/13 1000    Psychiatric Specialty Exam:     Blood pressure 186/97, pulse 76, temperature 99.3 F (37.4 C), temperature source Oral, resp. rate 18, height 5\' 6"  (1.676 m), weight 141 lb 5 oz (64.1 kg), SpO2 97.00%.Body mass index is 22.82 kg/(m^2).  General Appearance: Fairly Groomed and Neat  Patent attorney::  Good  Speech:  Normal Rate  Volume:  Normal  Mood:  Euthymic  Affect:  Blunt and Flat  Thought Process:  Coherent and Intact  Orientation:  Full (Time, Place, and Person)  Thought Content:  WDL  Suicidal Thoughts:  No  Homicidal Thoughts:  No  Memory:  NA  Judgement:  Poor  Insight:  Fair and poor  Psychomotor Activity:  Normal  Concentration:  Fair  Recall:  Fair  Akathisia:  No    AIMS (if indicated):     Assets:  Communication Skills Others:  lives with sons  Sleep:      Treatment Plan Summary: Medication management Discharge home with follow up to [facility] 04/15/2013 6:23 PM

## 2013-04-15 NOTE — Progress Notes (Signed)
Nutrition Consult/Brief Note  RD consult for DM diet education.    RD visited patient 7/8, left handouts "Carbohydrate Counting for People with Diabetes" from [employer].  This RD visited patient 7/9.  Patient with no questions and declined further review of information.  Body mass index is 22.82 kg/(m^2). Patient meets criteria for Normal based on current BMI.   Current diet order is Carbohydrate Modified Medium Calorie, patient is consuming approximately 100% of meals at this time. Labs and medications reviewed.   No nutrition interventions warranted at this time. If nutrition issues arise, please consult RD.   Maureen Chatters, RD, LDN Pager #: [phone] After-Hours Pager #: [phone]

## 2013-04-15 NOTE — Plan of Care (Signed)
Problem: Phase I Progression Outcomes Goal: Other Phase I Outcomes/Goals Outcome: Completed/Met Date Met:  04/15/13 Watched Diabetes videos [id number]

## 2013-04-15 NOTE — Progress Notes (Signed)
[facility] PROGRESS NOTE  Jane Higgins ZOX:[id number] DOB: 08-03-1956 DOA: 04/12/2013 PCP: No primary provider on file.  Assessment/Plan:  Status Epilepticus Present on admission. Required intubation.  Resolved 7/6.   Seen by Neuro who started Keppra on 7/7 and discontinued it on 7/9.  If she has another unprovoked seizure anti-epileptic therapy should be instituted per neuro. 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  Neuro recommends conservative management.  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  CBG > 1000 on admission. Started on insulin therapy. Will progress to Novolin 70-30 as this is what she will be discharged on. Will need to be discharged on insulin.  Patient is very hesitant.  DM coordinator has been following, RN will also give instruction.  Bipolar / schizophrenia  Psychiatry consultation requested.  They are scheduled to see her 7/9. Family has questions for psychiatry and would appreciate a phone call if they are not present at the time of the consultation.  Hypertension with hypertensive crisis on admission. Was initially treated with IV lopressor and hydralazine.  These are now discontinued. Troponin x 3 are negative. Will start on Metoprolol 25 mg bid. Will need PCP follow up for BP Outpatient echo recommended.  Acute kidney injury.  Likely acute on chronic KD.  Don't have a recent creatinine for baseline comparison Creatinine still trending up. (1.66 on admit, now at 1.83). Possibly secondary to dehydration, hypertensive crisis and status epilepticus Not on nephrotoxic medications. U/A negative for UTI Will need PCP to follow her kidneys.   DVT Prophylaxis:  lovenox  Code Status: full Family Communication:  Disposition Plan: discharge to home vs ALF when appropriate.   Consultants:  Neuro  Psych  Procedures:  Intubation 7/6 - extubation 7/7  Antibiotics:  Short term Unasyn  HPI/Subjective: 57 yo F with status  epilepticus, marked hypertension SBP >250, hyperglycemia with glucose >1000.  Currently no complaints. Wants discharge.  Is hesitant about insulin.  Prefers pills as DM therapy.   Objective: Filed Vitals:   04/14/13 1623 04/14/13 2035 04/15/13 0605 04/15/13 0650  BP: 158/78 160/83 184/96 164/74  Pulse: 83 83 83   Temp:  99 F (37.2 C) 98.5 F (36.9 C)   TempSrc:  Oral Oral   Resp: 20 18 18    Height:      Weight:      SpO2: 99% 97% 96%     Intake/Output Summary (Last 24 hours) at 04/15/13 1207 Last data filed at 04/15/13 8119  Gross per 24 hour  Intake    600 ml  Output      0 ml  Net    600 ml   Filed Weights   04/12/13 1437 04/13/13 0500  Weight: 66.999 kg (147 lb 11.3 oz) 64.1 kg (141 lb 5 oz)    Exam:   General:  A&O, NAD, Lying comfortably in bed  Cardiovascular: RRR, no murmurs, rubs or gallops, no lower extremity edema  Respiratory: CTA, no wheeze, crackles, or rales.  No increased work of breathing.  Abdomen: Soft, non-tender, non-distended, + bowel sounds, no masses  Musculoskeletal: Able to move all 4 extremities, 5/5 strength in each  Data Reviewed: Basic Metabolic Panel:  Recent Labs Lab 04/12/13 1441 04/12/13 1459 04/14/13 0410 04/15/13 0554  NA 127* 132* 143 141  K 3.4* 3.4* 2.8* 3.2*  CL 95* 97 112 113*  CO2 23  --  23 20  GLUCOSE 1019* >700* 86 147*  BUN 20 24* 14 12  CREATININE 1.66* 1.60*  1.72* 1.83*  CALCIUM 8.5  --  7.9* 8.3*   Liver Function Tests:  Recent Labs Lab 04/12/13 1441  AST 13  ALT 12  ALKPHOS 140*  BILITOT 0.3  PROT 6.8  ALBUMIN 3.5   CBC:  Recent Labs Lab 04/12/13 1441 04/12/13 1459 04/14/13 0410  WBC 5.9  --  16.8*  NEUTROABS 3.6  --   --   HGB 18.1* 13.6 10.6*  HCT 54.6* 40.0 31.9*  MCV 78.6  --  77.2*  PLT 74*  --  218   Cardiac Enzymes:  Recent Labs Lab 04/12/13 1435 04/12/13 1926  TROPONINI <0.30 <0.30   CBG:  Recent Labs Lab 04/14/13 1237 04/14/13 1727 04/14/13 2048  2013/05/05 0812 05/05/13 1142  GLUCAP 264* 195* 159* 147* 238*    Recent Results (from the past 240 hour(s))  MRSA PCR SCREENING     Status: None   Collection Time    04/12/13  5:59 PM      Result Value Range Status   MRSA by PCR NEGATIVE  NEGATIVE Final   Comment:            The GeneXpert MRSA Assay (FDA     approved for NASAL specimens     only), is one component of a     comprehensive MRSA colonization     surveillance program. It is not     intended to diagnose MRSA     infection nor to guide or     monitor treatment for     MRSA infections.  CULTURE, BLOOD (ROUTINE X 2)     Status: None   Collection Time    04/12/13  7:25 PM      Result Value Range Status   Specimen Description BLOOD RIGHT ARM   Final   Special Requests BOTTLES DRAWN AEROBIC ONLY 5CC   Final   Culture  Setup Time 04/13/2013 02:16   Final   Culture     Final   Value:        BLOOD CULTURE RECEIVED NO GROWTH TO DATE CULTURE WILL BE HELD FOR 5 DAYS BEFORE ISSUING A FINAL NEGATIVE REPORT   Report Status PENDING   Incomplete  CULTURE, BLOOD (ROUTINE X 2)     Status: None   Collection Time    04/12/13  7:25 PM      Result Value Range Status   Specimen Description BLOOD RIGHT HAND   Final   Special Requests BOTTLES DRAWN AEROBIC ONLY 3CC   Final   Culture  Setup Time 04/13/2013 02:16   Final   Culture     Final   Value:        BLOOD CULTURE RECEIVED NO GROWTH TO DATE CULTURE WILL BE HELD FOR 5 DAYS BEFORE ISSUING A FINAL NEGATIVE REPORT   Report Status PENDING   Incomplete     Studies: Mr Lodema Pilot Contrast  05-05-2013   *RADIOLOGY REPORT*  Clinical Data: Seizure.  Hypertension.  MRI HEAD WITHOUT AND WITH CONTRAST  Technique:  Multiplanar, multiecho pulse sequences of the brain and surrounding structures were obtained according to standard protocol without and with intravenous contrast  Contrast: 5mL MULTIHANCE GADOBENATE DIMEGLUMINE 529 MG/ML IV SOLN  Comparison: Head CT 04/12/2013  Findings: Diffusion  imaging does not show any acute or subacute infarction.  The brainstem and cerebellum are normal.  The cerebral hemispheres do not show any acute or subacute infarction.  There is a focus of susceptibility artifact in the right temporal lobe  consistent with hemosiderin deposition.  Usually, this is due to an underlying low grade venous anomaly such as cavernous angioma. There is no sign of enhancement, edema or mass effect in this region.  The cerebral hemispheres are otherwise normal.  No evidence of any other old or acute infarction.  No mass lesion, hydrocephalus or extra-axial collection.  No pituitary mass. Sinuses, middle ears and mastoids are clear.  No skull or skull base lesion.  IMPRESSION: Focus of susceptibility artifact in the right temporal lobe consistent with hemosiderin deposition.  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  The differential diagnosis includes old brain hemorrhage related to micro infarction or trauma.  Those are less likely.  The remainder of the brain is entirely normal.  This abnormality could relate to the patient's history of seizure.   Original Report Authenticated By: Paulina Fusi, M.D.    Scheduled Meds: . enoxaparin (LOVENOX) injection  40 mg Subcutaneous Q24H  . insulin aspart  0-15 Units Subcutaneous TID WC  . insulin aspart  0-5 Units Subcutaneous QHS  . insulin glargine  10 Units Subcutaneous QHS  . metoprolol tartrate  25 mg Oral BID  . nicotine  21 mg Transdermal Daily   Continuous Infusions:   Active Problems:   Generalized seizures   Hypertensive urgency   Hyperglycemia   Acute respiratory failure    Conley Canal  [facility] Pager [phone]. If 7PM-7AM, please contact night-coverage at [email or web address], password [employer] 04/15/2013, 12:07 PM  LOS: 3 days

## 2013-04-15 NOTE — Care Management Note (Addendum)
    Page 1 of 2   04/16/2013     12:25:43 PM   CARE MANAGEMENT NOTE 04/16/2013  Patient:  Jane, Higgins   Account Number:  [id number]  Date Initiated:  04/15/2013  Documentation initiated by:  Letha Cape  Subjective/Objective Assessment:   dx seizures  admit- lives with son and daughter n law.  pta indep.     Action/Plan:   Anticipated DC Date:  04/16/2013   Anticipated DC Plan:  HOME W HOME HEALTH SERVICES      DC Planning Services  CM consult  MATCH Program      [facility] Choice  HOME HEALTH   Choice offered to / List presented to:  C-1 Patient        HH arranged  HH-1 RN      [facility] agency  [facility]   Status of service:  Completed, signed off Medicare Important Message given?   (If response is "NO", the following Medicare IM given date fields will be blank) Date Medicare IM given:   Date Additional Medicare IM given:    Discharge Disposition:  HOME W HOME HEALTH SERVICES  Per UR Regulation:  Reviewed for med. necessity/level of care/duration of stay  If discussed at Long Length of Stay Meetings, dates discussed:    Comments:  04/16/13 12:19 Letha Cape RN,BSN [phone] pattient is for dc today, financial couselor found out she has Medicare but she does not have medication coverage. Pateint states she needs ast with meds, assited her with the Match Program.  Also gave her information on reli- meters for $16.24 and reli- on strips for $9 at [facility]. Patient chose [facility] from agency list for [facility], referral made to [facility] for [facility].  Soc will begin to 24- 48 hrs post discharge.  04/15/13 16:19 Letha Cape RN, BSN [phone] patient lives with son and daughter n law.  PTA indep. Patient states she has insurance thru her son. Son will bring insurance information to patient's room.  NCM will continue to follow for dc needs.

## 2013-04-16 DIAGNOSIS — R569 Unspecified convulsions: Secondary | ICD-10-CM

## 2013-04-16 DIAGNOSIS — R7309 Other abnormal glucose: Secondary | ICD-10-CM

## 2013-04-16 DIAGNOSIS — F209 Schizophrenia, unspecified: Secondary | ICD-10-CM

## 2013-04-16 DIAGNOSIS — J9600 Acute respiratory failure, unspecified whether with hypoxia or hypercapnia: Secondary | ICD-10-CM

## 2013-04-16 DIAGNOSIS — I10 Essential (primary) hypertension: Secondary | ICD-10-CM

## 2013-04-16 LAB — CBC
Hemoglobin: 10.5 g/dL — ABNORMAL LOW (ref 12.0–15.0)
Platelets: 264 10*3/uL (ref 150–400)
RBC: 4.15 MIL/uL (ref 3.87–5.11)
WBC: 12.6 10*3/uL — ABNORMAL HIGH (ref 4.0–10.5)

## 2013-04-16 LAB — GLUCOSE, CAPILLARY
Glucose-Capillary: 73 mg/dL (ref 70–99)
Glucose-Capillary: 58 mg/dL — ABNORMAL LOW (ref 70–99)
Glucose-Capillary: 84 mg/dL (ref 70–99)

## 2013-04-16 LAB — BASIC METABOLIC PANEL
CO2: 19 mEq/L (ref 19–32)
Calcium: 9 mg/dL (ref 8.4–10.5)
GFR calc Af Amer: 37 mL/min — ABNORMAL LOW (ref >90)
GFR calc non Af Amer: 32 mL/min — ABNORMAL LOW (ref >90)
GFR calc non Af Amer: 32 mL/min — ABNORMAL LOW (ref >90)
Potassium: 3.2 mEq/L — ABNORMAL LOW (ref 3.5–5.1)
Potassium: 3.1 mEq/L — ABNORMAL LOW (ref 3.5–5.1)
Sodium: 142 mEq/L (ref 135–145)
Sodium: 141 mEq/L (ref 135–145)

## 2013-04-16 MED ORDER — AMLODIPINE BESYLATE 5 MG PO TABS: 5.0000 mg | ORAL_TABLET | Freq: Every day | ORAL | Status: AC

## 2013-04-16 MED ORDER — NICOTINE 21 MG/24HR TD PT24: 1.0000 | MEDICATED_PATCH | Freq: Every day | TRANSDERMAL | Status: DC

## 2013-04-16 MED ORDER — POTASSIUM CHLORIDE CRYS ER 20 MEQ PO TBCR
40.0000 meq | EXTENDED_RELEASE_TABLET | Freq: Once | ORAL | Status: AC
  Administered 2013-04-16: 40 meq via ORAL
  Filled 2013-04-16: qty 2

## 2013-04-16 MED ORDER — METOPROLOL TARTRATE 25 MG PO TABS: 25.0000 mg | ORAL_TABLET | Freq: Two times a day (BID) | ORAL | Status: AC

## 2013-04-16 MED ORDER — INSULIN NPH ISOPHANE & REGULAR (70-30) 100 UNIT/ML ~~LOC~~ SUSP: 15.0000 [IU] | Freq: Two times a day (BID) | SUBCUTANEOUS | Status: DC

## 2013-04-16 MED ORDER — LIVING WELL WITH DIABETES BOOK
Freq: Once | Status: AC
  Administered 2013-04-16: 11:00:00
  Filled 2013-04-16: qty 1

## 2013-04-16 NOTE — Discharge Summary (Signed)
Physician Discharge Summary  Jane Higgins:[id number] DOB: November 19, 1955 DOA: 04/12/2013  PCP: No primary provider on file.  Admit date: 04/12/2013 Discharge date: 04/16/2013  Time spent: 60 minutes  Recommendations for Outpatient Follow-up:   Home Health RN for medication management and insulin / cbg testing assistance / instruction  PCP follow up for blood pressure, kidney function and DM management.  Present to [facility] on 04/17/13   Discharge Diagnoses:  Active Problems:   Generalized seizures   Hypertensive urgency   Hyperglycemia   Acute respiratory failure   Schizophrenia   Discharge Condition: Stable.  Learning how to adjust her insulin  Diet recommendation: carb modified  Filed Weights   04/12/13 1437 04/13/13 0500  Weight: 66.999 kg (147 lb 11.3 oz) 64.1 kg (141 lb 5 oz)    History of present illness at the time of admission:  57 yo F with hx of bipolar, not on any meds presents with status epilepticus, hypertension, and hyperglycemia. Per EDP, pt went into bathroom for 30 minutes earlier today, and upon leaving was weak and began to have seizure activity. In status epilepticus en route to ER, required intubation upon arrival. Day prior and in am , was wnl.  She hasnt taken any meds for bipolar since 2008.  Hospital Course:   Status Epilepticus  Present on admission. Required intubation. Resolved 7/6.  Seen by Neuro who started Keppra on 7/7 and discontinued it on 7/9. If she has another unprovoked seizure anti-epileptic therapy should be instituted per neuro.  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 Neuro recommends conservative management.   Hyper osmolar state  DM with Hbg A1C of 13.3. CBG > 1000 on admission.  Started on insulin therapy. Changed to Novolin 70-30 as this is what she will be discharged on.  Will need to be discharged on insulin. Patient is very hesitant. DM coordinator has been following, RN has also given  extensive instruction.  HHRN ordered in order to assist with insulin and medication management at home as the patient appears to have slight mental disabilities and poor insight.  Bipolar / schizophrenia  -Seen by Dr. Lucianne Muss of Psychiatry on 7/9 -Patient has not received psychiatric treatment for 2 years -Patient lacks insight and denies need for psychiatric treatment. -Per Dr. Lucianne Muss, patient is to follow up at [facility] as an outpatient.  Hypertension with hypertensive crisis on admission.  Was initially treated with IV lopressor and hydralazine. These are now discontinued.  Troponin x 3 are negative.  Started on Metoprolol 25 mg bid.  Will need PCP follow up for BP  Outpatient echo recommended.  \ Acute kidney injury. Likely acute on chronic KD. Don't have a recent creatinine for baseline comparison  Creatinine is currently at 1.68.  Question if this is her baseline. Possibly secondary to dehydration, hypertensive crisis and status epilepticus  Not on nephrotoxic medications.  U/A negative for UTI  Will need PCP to follow her kidneys.    Procedures:  Intubation / extubation  Consultations:  Neuro  Psych  CCM  Discharge Exam: Filed Vitals:   04/15/13 2127 04/15/13 2232 04/16/13 0543 04/16/13 0602  BP: 190/89 152/87 188/90 165/85  Pulse: 75 74 75   Temp: 98.7 F (37.1 C)  99 F (37.2 C)   TempSrc: Oral  Oral   Resp: 18  18   Height:      Weight:      SpO2: 97%  94%    General: A&O, NAD, Lying  comfortably in bed, Pleasant, reports she's ready for d/c Cardiovascular: RRR, no murmurs, rubs or gallops, no lower extremity edema Respiratory: CTA, no wheeze, crackles, or rales. No increased work of breathing. Abdomen: Soft, non-tender, non-distended, + bowel sounds, no masses Musculoskeletal: Able to move all 4 extremities, 5/5 strength in each   Discharge Instructions      Discharge Orders   Future Appointments Provider Department Dept Phone   04/22/2013  5:00 PM [facility] Covering Provider [facility] Joan Flores [phone]   Future Orders Complete By Expires     Ambulatory referral to Nutrition and Diabetic Education  As directed     Comments:      A1C was 13.3% on 04/14/13.  Patient has history of diabetes and use to take "a pill" for diabetes control. She has not taken any medication for diabetes since 2008.  New to insulin.  Discharged on Novolin 70/30 and Novolin R.  Need diabetes education.    Diet - low sodium heart healthy  As directed     Diet Carb Modified  As directed     Increase activity slowly  As directed     Increase activity slowly  As directed         Medication List    STOP taking these medications       BC HEADACHE POWDER PO      TAKE these medications       ADVIL 200 MG tablet  Generic drug:  ibuprofen  Take 800 mg by mouth daily as needed for pain.     amLODipine 5 MG tablet  Commonly known as:  NORVASC  Take 1 tablet (5 mg total) by mouth daily.     insulin NPH-regular (70-30) 100 UNIT/ML injection  Commonly known as:  NOVOLIN 70/30  Inject 15 Units into the skin 2 (two) times daily with a meal.     metoprolol tartrate 25 MG tablet  Commonly known as:  LOPRESSOR  Take 1 tablet (25 mg total) by mouth 2 (two) times daily.     nicotine 21 mg/24hr patch  Commonly known as:  NICODERM CQ - dosed in mg/24 hours  Place 1 patch onto the skin daily.       No Known Allergies Follow-up Information   Follow up with CALLAHAN,ANGELA, NP On 05/21/2013. (2:15 pm)    Contact information:   [address] [place] [address] [phone]       Follow up with [facility] (please present to [facility] on 04/17/13 for on-going treatment.)    Contact information:   [address] [place] [address] [phone]       The results of significant diagnostics from this hospitalization (including imaging, microbiology, ancillary and laboratory) are listed below  for reference.    Significant Diagnostic Studies: Ct Head Wo Contrast  04/12/2013   *RADIOLOGY REPORT*  Clinical Data: Status epilepticus, code stroke  CT HEAD WITHOUT CONTRAST  Technique:  Contiguous axial images were obtained from the base of the skull through the vertex without contrast.  Comparison: None.  Findings: No acute intracranial hemorrhage, acute infarction, mass lesion, mass effect, midline shift or hydrocephalus.  Gray-white differentiation is preserved throughout.  The globes are intact bilaterally.  Symmetric and unremarkable orbits.  No focal soft tissue abnormality.  Normal aeration of the mastoid air cells and visualized paranasal sinuses.  No focal calvarial abnormality.  IMPRESSION:  Negative head CT.  These results were called by telephone on 04/12/2013  at 03:04 p.m. to Dr. Roseanne Reno, who verbally acknowledged these results.   Original Report Authenticated By: Malachy Moan, M.D.   Mr Laqueta Jean NW Contrast  04/15/2013   *RADIOLOGY REPORT*  Clinical Data: Seizure.  Hypertension.  MRI HEAD WITHOUT AND WITH CONTRAST  Technique:  Multiplanar, multiecho pulse sequences of the brain and surrounding structures were obtained according to standard protocol without and with intravenous contrast  Contrast: 5mL MULTIHANCE GADOBENATE DIMEGLUMINE 529 MG/ML IV SOLN  Comparison: Head CT 04/12/2013  Findings: Diffusion imaging does not show any acute or subacute infarction.  The brainstem and cerebellum are normal.  The cerebral hemispheres do not show any acute or subacute infarction.  There is a focus of susceptibility artifact in the right temporal lobe consistent with hemosiderin deposition.  Usually, this is due to an underlying low grade venous anomaly such as cavernous angioma. There is no sign of enhancement, edema or mass effect in this region.  The cerebral hemispheres are otherwise normal.  No evidence of any other old or acute infarction.  No mass lesion, hydrocephalus or extra-axial  collection.  No pituitary mass. Sinuses, middle ears and mastoids are clear.  No skull or skull base lesion.  IMPRESSION: Focus of susceptibility artifact in the right temporal lobe consistent with hemosiderin deposition.  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  The differential diagnosis includes old brain hemorrhage related to micro infarction or trauma.  Those are less likely.  The remainder of the brain is entirely normal.  This abnormality could relate to the patient's history of seizure.   Original Report Authenticated By: Paulina Fusi, M.D.   Dg Chest Port 1 View  04/13/2013   *RADIOLOGY REPORT*  Clinical Data: Endotracheal tube positioning  PORTABLE CHEST - 1 VIEW  Comparison: 04/12/2013  Findings: Endotracheal tube tip is 5.3 cm above the carina. Nasogastric tube tip in the stomach.  There is new mild airspace opacity along the left hemidiaphragm favoring atelectasis over early pneumonia.  The lungs appear otherwise clear.  Borderline cardiomegaly, cardiothoracic index 58%.  IMPRESSION:  1.  Mildly increased airspace opacity along the left hemidiaphragm, favoring atelectasis. 2.  Borderline cardiomegaly.   Original Report Authenticated By: Gaylyn Rong, M.D.   Dg Chest Port 1 View  04/12/2013   *RADIOLOGY REPORT*  Clinical Data: Status epilepticus.  Code stroke. New intubation.  PORTABLE CHEST - 1 VIEW  Comparison: Chest x-ray dated 06/27/2007  Findings: Endotracheal tube is at the thoracic inlet in good position.  NG tube has been inserted and the tip is at the gastroesophageal junction and needs to be advanced.  Heart size and vascularity are normal and the lungs are clear.  No osseous abnormality.  IMPRESSION:  1.  Endotracheal tube in good position. 2.  NG tube is at the gastroesophageal junction and needs to be advanced. 3.  No acute abnormalities in the chest.   Original Report Authenticated By: Francene Boyers, M.D.     Microbiology: Recent Results (from the past 240 hour(s))  MRSA PCR SCREENING     Status: None   Collection Time    04/12/13  5:59 PM      Result Value Range Status   MRSA by PCR NEGATIVE  NEGATIVE Final   Comment:            The GeneXpert MRSA Assay (FDA     approved for NASAL specimens     only), is one component of a  comprehensive MRSA colonization     surveillance program. It is not     intended to diagnose MRSA     infection nor to guide or     monitor treatment for     MRSA infections.  CULTURE, BLOOD (ROUTINE X 2)     Status: None   Collection Time    04/12/13  7:25 PM      Result Value Range Status   Specimen Description BLOOD RIGHT ARM   Final   Special Requests BOTTLES DRAWN AEROBIC ONLY 5CC   Final   Culture  Setup Time 04/13/2013 02:16   Final   Culture     Final   Value:        BLOOD CULTURE RECEIVED NO GROWTH TO DATE CULTURE WILL BE HELD FOR 5 DAYS BEFORE ISSUING A FINAL NEGATIVE REPORT   Report Status PENDING   Incomplete  CULTURE, BLOOD (ROUTINE X 2)     Status: None   Collection Time    04/12/13  7:25 PM      Result Value Range Status   Specimen Description BLOOD RIGHT HAND   Final   Special Requests BOTTLES DRAWN AEROBIC ONLY 3CC   Final   Culture  Setup Time 04/13/2013 02:16   Final   Culture     Final   Value:        BLOOD CULTURE RECEIVED NO GROWTH TO DATE CULTURE WILL BE HELD FOR 5 DAYS BEFORE ISSUING A FINAL NEGATIVE REPORT   Report Status PENDING   Incomplete     Labs: Basic Metabolic Panel:  Recent Labs Lab 04/12/13 1441 04/12/13 1459 04/14/13 0410 04/15/13 0554 04/16/13 0605 04/16/13 0810  NA 127* 132* 143 141 142 141  K 3.4* 3.4* 2.8* 3.2* 3.2* 3.1*  CL 95* 97 112 113* 110 109  CO2 23  --  23 20 19 19   GLUCOSE 1019* >700* 86 147* 75 81  BUN 20 24* 14 12 12 11   CREATININE 1.66* 1.60* 1.72* 1.83* 1.71* 1.68*  CALCIUM 8.5  --  7.9* 8.3* 9.0 9.0   Liver Function Tests:  Recent Labs Lab 04/12/13 1441  AST 13  ALT 12   ALKPHOS 140*  BILITOT 0.3  PROT 6.8  ALBUMIN 3.5   CBC:  Recent Labs Lab 04/12/13 1441 04/12/13 1459 04/14/13 0410 04/16/13 0605  WBC 5.9  --  16.8* 12.6*  NEUTROABS 3.6  --   --   --   HGB 18.1* 13.6 10.6* 10.5*  HCT 54.6* 40.0 31.9* 32.5*  MCV 78.6  --  77.2* 78.3  PLT 74*  --  218 264   Cardiac Enzymes:  Recent Labs Lab 04/12/13 1435 04/12/13 1926  TROPONINI <0.30 <0.30   CBG:  Recent Labs Lab 04/15/13 1817 04/15/13 2132 04/16/13 0749 04/16/13 1214 04/16/13 1232  GLUCAP 143* 85 73 58* 84       Signed:  Stephani Police, PA-C [facility] 04/16/2013, 2:12 PM

## 2013-04-16 NOTE — Progress Notes (Signed)
Inpatient Diabetes Program Recommendations  AACE/ADA: New Consensus Statement on Inpatient Glycemic Control (2013)  Target Ranges:  Prepandial:   less than 140 mg/dL      Peak postprandial:   less than 180 mg/dL (1-2 hours)      Critically ill patients:  140 - 180 mg/dL   Results for JERMEKA, SCHLOTTERBECK (MRN [id number]) as of 04/16/2013 08:46  Ref. Range 04/15/2013 08:12 04/15/2013 11:42 04/15/2013 18:17 04/15/2013 21:32 04/16/2013 07:49  Glucose-Capillary Latest Range: 70-99 mg/dL 409 (H) 811 (H) 914 (H) 85 73    Inpatient Diabetes Program Recommendations Insulin - Basal: Please consider decreasing 70/30 to 17 units BID.  Note: Patient was started on 70/30 18 units BID yesterday and received the first dose at 18:30 on 04/16/13.  Fasting blood glucose this morning was 73 mg/dl.  Please consider decreasing 70/30 to 17 units BID to prevent blood sugar from dropping to low.  Plan to meet with patient today to ensure patient has a clear understanding of diabetes education.  Will continue to follow.  Thanks, Orlando Penner, RN, MSN, CCRN Diabetes Coordinator Inpatient Diabetes Program [phone]

## 2013-04-16 NOTE — Plan of Care (Signed)
Problem: Discharge Progression Outcomes Goal: Other Discharge Outcomes/Goals Outcome: Completed/Met Date Met:  04/16/13 Diabetic videos 501-510 seen. Insulin drawn up and administered by patient. Patient checked her own blood sugars two times. Patient given mosby exit care info and living well with diabetes book.

## 2013-04-16 NOTE — Progress Notes (Signed)
Nsg Discharge Note  Admit Date:  04/12/2013 Discharge date: 04/16/2013   Jane Higgins to be D/C'd Home per MD order.  AVS completed.  Copy for chart, and copy for patient signed, and dated. Patient/caregiver able to verbalize understanding.  Discharge Medication:   Medication List    STOP taking these medications       BC HEADACHE POWDER PO      TAKE these medications       ADVIL 200 MG tablet  Generic drug:  ibuprofen  Take 800 mg by mouth daily as needed for pain.     amLODipine 5 MG tablet  Commonly known as:  NORVASC  Take 1 tablet (5 mg total) by mouth daily.     insulin NPH-regular (70-30) 100 UNIT/ML injection  Commonly known as:  NOVOLIN 70/30  Inject 15 Units into the skin 2 (two) times daily with a meal.     metoprolol tartrate 25 MG tablet  Commonly known as:  LOPRESSOR  Take 1 tablet (25 mg total) by mouth 2 (two) times daily.     nicotine 21 mg/24hr patch  Commonly known as:  NICODERM CQ - dosed in mg/24 hours  Place 1 patch onto the skin daily.        Discharge Assessment: Filed Vitals:   04/16/13 0602  BP: 165/85  Pulse:   Temp:   Resp:    Skin clean, dry and intact without evidence of skin break down, no evidence of skin tears noted. IV catheter discontinued intact. Site without signs and symptoms of complications - no redness or edema noted at insertion site, patient denies c/o pain - only slight tenderness at site.  Dressing with slight pressure applied.  D/c Instructions-Education: Discharge instructions given to patient/family with verbalized understanding. D/c education completed with patient/family including follow up instructions, medication list, d/c activities limitations if indicated, with other d/c instructions as indicated by MD - patient able to verbalize understanding, all questions fully answered. Patient instructed to return to ED, call 911, or call MD for any changes in condition.  Patient escorted via WC, and D/C home via  private auto.  Jane Conrow Consuella Lose, RN 04/16/2013 2:47 PM

## 2013-04-16 NOTE — Progress Notes (Signed)
Spoke with pt about diabetes. Discussed A1C results (13.3%) with her and explained what an A1C is, basic pathophysiology of DM Type 2, basic home care, importance of checking CBGs and maintaining good CBG control to prevent long-term and short-term complications. Reviewed signs and symptoms of hyperglycemia and hypoglycemia along with proper treatment.  Reviewed information verbally and also via Living Well with Diabetes booklet.  Bedside Nursing staff has been working with patient and allowed her to check her blood sugar, draw up insulin, and self-administer injections.  Patient has watched diabetes education videos as ordered.  Patient needed frequent cues when asked to verbalize how to draw up and inject insulin.  Plan is to discharge patient today.  Asked that she call her daughter (who will be picking her up) and ask that she be present for discharge instructions so that both the patient and her daughter will have an understanding of discharge instructions.  Provided patient information on Reli-On glucometer and Novolin insulins that can be purchased at [employer].    Noted that blood glucose this morning was 73 mg/dl and 58 mg/dl at 16:10.  Reviewed discharge insulin dosages with Ginger, RN and asked that she talk with doctor and request that 70/30 dose be decreased to 70/30 15 units BID to prevent hypoglycemic episodes.  Voiced concern of patient understanding of drawing up and administering insulin to Ginger, RN and she reports that she will be sure to teach patient's daughter how to draw up and administer insulin when she goes over discharge instructions.  Recommend outpatient diabetes education; therefore, placed order for [facility] Diabetes E[facility].  RNs to provide ongoing basic DM education at bedside with this patient.   Thanks, Orlando Penner, RN, MSN, CCRN Diabetes Coordinator Inpatient Diabetes Program [phone]

## 2013-04-18 NOTE — Discharge Summary (Signed)
Addendum  Patient seen and examined, chart and data base reviewed.  I agree with the above assessment and plan.  For full details please see Mrs. Marianne York PA note.   Neftaly Swiss A Dannette Kinkaid, MD [facility] Pager: [phone] 04/18/2013, 11:38 AM   

## 2013-04-19 LAB — CULTURE, BLOOD (ROUTINE X 2): Culture: NO GROWTH

## 2013-04-22 ENCOUNTER — Inpatient Hospital Stay: Payer: Self-pay

## 2013-04-22 ENCOUNTER — Ambulatory Visit: Payer: Medicare Other

## 2013-04-29 ENCOUNTER — Ambulatory Visit: Payer: Medicare Other

## 2015-05-28 ENCOUNTER — Encounter (HOSPITAL_COMMUNITY): Payer: Self-pay | Admitting: *Deleted

## 2015-05-28 ENCOUNTER — Inpatient Hospital Stay (HOSPITAL_COMMUNITY)
Admission: EM | Admit: 2015-05-28 | Discharge: 2015-06-05 | DRG: 674 | Disposition: A | Discharge status: Home / Self Care | Payer: Medicare Other | Attending: Internal Medicine | Admitting: Internal Medicine

## 2015-05-28 ENCOUNTER — Inpatient Hospital Stay (HOSPITAL_COMMUNITY): Payer: Medicare Other

## 2015-05-28 DIAGNOSIS — Z794 Long term (current) use of insulin: Secondary | ICD-10-CM | POA: Diagnosis not present

## 2015-05-28 DIAGNOSIS — Z681 Body mass index (BMI) 19 or less, adult: Secondary | ICD-10-CM

## 2015-05-28 DIAGNOSIS — D649 Anemia, unspecified: Secondary | ICD-10-CM | POA: Diagnosis present

## 2015-05-28 DIAGNOSIS — I129 Hypertensive chronic kidney disease with stage 1 through stage 4 chronic kidney disease, or unspecified chronic kidney disease: Secondary | ICD-10-CM | POA: Diagnosis present

## 2015-05-28 DIAGNOSIS — N185 Chronic kidney disease, stage 5: Secondary | ICD-10-CM | POA: Diagnosis not present

## 2015-05-28 DIAGNOSIS — E872 Acidosis: Secondary | ICD-10-CM | POA: Diagnosis present

## 2015-05-28 DIAGNOSIS — R05 Cough: Secondary | ICD-10-CM

## 2015-05-28 DIAGNOSIS — I160 Hypertensive urgency: Secondary | ICD-10-CM | POA: Diagnosis present

## 2015-05-28 DIAGNOSIS — Z01810 Encounter for preprocedural cardiovascular examination: Secondary | ICD-10-CM | POA: Diagnosis not present

## 2015-05-28 DIAGNOSIS — N189 Chronic kidney disease, unspecified: Secondary | ICD-10-CM | POA: Diagnosis present

## 2015-05-28 DIAGNOSIS — E876 Hypokalemia: Secondary | ICD-10-CM | POA: Diagnosis present

## 2015-05-28 DIAGNOSIS — F17210 Nicotine dependence, cigarettes, uncomplicated: Secondary | ICD-10-CM | POA: Diagnosis present

## 2015-05-28 DIAGNOSIS — D696 Thrombocytopenia, unspecified: Secondary | ICD-10-CM | POA: Diagnosis present

## 2015-05-28 DIAGNOSIS — I120 Hypertensive chronic kidney disease with stage 5 chronic kidney disease or end stage renal disease: Secondary | ICD-10-CM | POA: Diagnosis present

## 2015-05-28 DIAGNOSIS — D638 Anemia in other chronic diseases classified elsewhere: Secondary | ICD-10-CM | POA: Diagnosis present

## 2015-05-28 DIAGNOSIS — D631 Anemia in chronic kidney disease: Secondary | ICD-10-CM | POA: Diagnosis present

## 2015-05-28 DIAGNOSIS — E440 Moderate protein-calorie malnutrition: Secondary | ICD-10-CM | POA: Insufficient documentation

## 2015-05-28 DIAGNOSIS — E1122 Type 2 diabetes mellitus with diabetic chronic kidney disease: Secondary | ICD-10-CM | POA: Diagnosis present

## 2015-05-28 DIAGNOSIS — I10 Essential (primary) hypertension: Secondary | ICD-10-CM | POA: Diagnosis not present

## 2015-05-28 DIAGNOSIS — N186 End stage renal disease: Secondary | ICD-10-CM | POA: Insufficient documentation

## 2015-05-28 DIAGNOSIS — N179 Acute kidney failure, unspecified: Secondary | ICD-10-CM | POA: Diagnosis present

## 2015-05-28 DIAGNOSIS — E1165 Type 2 diabetes mellitus with hyperglycemia: Secondary | ICD-10-CM | POA: Diagnosis present

## 2015-05-28 DIAGNOSIS — F319 Bipolar disorder, unspecified: Secondary | ICD-10-CM | POA: Diagnosis present

## 2015-05-28 DIAGNOSIS — F17200 Nicotine dependence, unspecified, uncomplicated: Secondary | ICD-10-CM

## 2015-05-28 DIAGNOSIS — K5900 Constipation, unspecified: Secondary | ICD-10-CM | POA: Diagnosis present

## 2015-05-28 DIAGNOSIS — Z992 Dependence on renal dialysis: Secondary | ICD-10-CM | POA: Diagnosis not present

## 2015-05-28 DIAGNOSIS — E119 Type 2 diabetes mellitus without complications: Secondary | ICD-10-CM

## 2015-05-28 DIAGNOSIS — N19 Unspecified kidney failure: Secondary | ICD-10-CM | POA: Insufficient documentation

## 2015-05-28 DIAGNOSIS — R634 Abnormal weight loss: Secondary | ICD-10-CM

## 2015-05-28 HISTORY — DX: Type 2 diabetes mellitus without complications: E11.9

## 2015-05-28 HISTORY — DX: Essential (primary) hypertension: I10

## 2015-05-28 LAB — URINALYSIS, ROUTINE W REFLEX MICROSCOPIC
BILIRUBIN URINE: NEGATIVE
GLUCOSE, UA: 100 mg/dL — AB
KETONES UR: NEGATIVE mg/dL
NITRITE: NEGATIVE
Specific Gravity, Urine: 1.01 (ref 1.005–1.030)
Urobilinogen, UA: 0.2 mg/dL (ref 0.0–1.0)
pH: 5 (ref 5.0–8.0)

## 2015-05-28 LAB — RETICULOCYTES
RBC.: 2.57 MIL/uL — AB (ref 3.87–5.11)
RETIC CT PCT: 1.9 % (ref 0.4–3.1)
Retic Count, Absolute: 48.8 10*3/uL (ref 19.0–186.0)

## 2015-05-28 LAB — CBC
HCT: 20.6 % — ABNORMAL LOW (ref 36.0–46.0)
Hemoglobin: 6.8 g/dL — CL (ref 12.0–15.0)
MCH: 26.1 pg (ref 26.0–34.0)
MCHC: 33 g/dL (ref 30.0–36.0)
MCV: 78.9 fL (ref 78.0–100.0)
PLATELETS: 286 10*3/uL (ref 150–400)
RBC: 2.61 MIL/uL — AB (ref 3.87–5.11)
RDW: 15.5 % (ref 11.5–15.5)
WBC: 9.2 10*3/uL (ref 4.0–10.5)

## 2015-05-28 LAB — HEPATIC FUNCTION PANEL
ALT: 9 U/L — ABNORMAL LOW (ref 14–54)
AST: 9 U/L — ABNORMAL LOW (ref 15–41)
Albumin: 3.6 g/dL (ref 3.5–5.0)
Alkaline Phosphatase: 63 U/L (ref 38–126)
BILIRUBIN INDIRECT: 0.4 mg/dL (ref 0.3–0.9)
Bilirubin, Direct: 0.1 mg/dL (ref 0.1–0.5)
TOTAL PROTEIN: 6.8 g/dL (ref 6.5–8.1)
Total Bilirubin: 0.5 mg/dL (ref 0.3–1.2)

## 2015-05-28 LAB — I-STAT CHEM 8, ED
BUN: 137 mg/dL — AB (ref 6–20)
CALCIUM ION: 0.75 mmol/L — AB (ref 1.13–1.30)
Chloride: 109 mmol/L (ref 101–111)
GLUCOSE: 224 mg/dL — AB (ref 65–99)
HCT: 23 % — ABNORMAL LOW (ref 36.0–46.0)
HEMOGLOBIN: 7.8 g/dL — AB (ref 12.0–15.0)
Potassium: 2.8 mmol/L — ABNORMAL LOW (ref 3.5–5.1)
Sodium: 142 mmol/L (ref 135–145)
TCO2: 10 mmol/L (ref 0–100)

## 2015-05-28 LAB — FOLATE: Folate: 14.7 ng/mL (ref >5.9)

## 2015-05-28 LAB — BASIC METABOLIC PANEL
Anion gap: 25 — ABNORMAL HIGH (ref 5–15)
BUN: 136 mg/dL — AB (ref 6–20)
CALCIUM: 6.5 mg/dL — AB (ref 8.9–10.3)
CO2: 12 mmol/L — ABNORMAL LOW (ref 22–32)
CREATININE: 16.97 mg/dL — AB (ref 0.44–1.00)
Chloride: 105 mmol/L (ref 101–111)
GFR calc non Af Amer: 2 mL/min — ABNORMAL LOW (ref >60)
GFR, EST AFRICAN AMERICAN: 2 mL/min — AB (ref >60)
Glucose, Bld: 213 mg/dL — ABNORMAL HIGH (ref 65–99)
Potassium: 2.9 mmol/L — ABNORMAL LOW (ref 3.5–5.1)
SODIUM: 142 mmol/L (ref 135–145)

## 2015-05-28 LAB — POC OCCULT BLOOD, ED: Fecal Occult Bld: NEGATIVE

## 2015-05-28 LAB — PHOSPHORUS: PHOSPHORUS: 13.8 mg/dL — AB (ref 2.5–4.6)

## 2015-05-28 LAB — URINE MICROSCOPIC-ADD ON

## 2015-05-28 LAB — CBG MONITORING, ED: Glucose-Capillary: 184 mg/dL — ABNORMAL HIGH (ref 65–99)

## 2015-05-28 LAB — ABO/RH: ABO/RH(D): A POS

## 2015-05-28 LAB — GLUCOSE, CAPILLARY: Glucose-Capillary: 82 mg/dL (ref 65–99)

## 2015-05-28 LAB — CK: CK TOTAL: 330 U/L — AB (ref 38–234)

## 2015-05-28 LAB — IRON AND TIBC
Iron: 144 ug/dL (ref 28–170)
Saturation Ratios: 84 % — ABNORMAL HIGH (ref 10.4–31.8)
TIBC: 172 ug/dL — AB (ref 250–450)
UIBC: 28 ug/dL

## 2015-05-28 LAB — TSH: TSH: 3.773 u[IU]/mL (ref 0.350–4.500)

## 2015-05-28 LAB — VITAMIN B12: VITAMIN B 12: 1339 pg/mL — AB (ref 180–914)

## 2015-05-28 LAB — FERRITIN: Ferritin: 440 ng/mL — ABNORMAL HIGH (ref 11–307)

## 2015-05-28 LAB — MAGNESIUM: Magnesium: 1.8 mg/dL (ref 1.7–2.4)

## 2015-05-28 MED ORDER — SODIUM CHLORIDE 0.9 % IV SOLN: 10.0000 mL/h | Freq: Once | INTRAVENOUS | Status: DC

## 2015-05-28 MED ORDER — SENNOSIDES-DOCUSATE SODIUM 8.6-50 MG PO TABS: 1.0000 | ORAL_TABLET | Freq: Every evening | ORAL | Status: DC | PRN

## 2015-05-28 MED ORDER — SODIUM CHLORIDE 0.9 % IJ SOLN
3.0000 mL | Freq: Two times a day (BID) | INTRAMUSCULAR | Status: DC
  Administered 2015-05-28 – 2015-06-05 (×14): 3 mL via INTRAVENOUS

## 2015-05-28 MED ORDER — POTASSIUM CHLORIDE IN NACL 20-0.9 MEQ/L-% IV SOLN
INTRAVENOUS | Status: DC
Start: 2015-05-28 — End: 2015-05-31
  Administered 2015-05-28 – 2015-05-31 (×5): via INTRAVENOUS
  Filled 2015-05-28 (×8): qty 1000

## 2015-05-28 MED ORDER — ACETAMINOPHEN 325 MG PO TABS: 650.0000 mg | ORAL_TABLET | Freq: Four times a day (QID) | ORAL | Status: DC | PRN

## 2015-05-28 MED ORDER — HEPARIN SODIUM (PORCINE) 5000 UNIT/ML IJ SOLN
5000.0000 [IU] | Freq: Three times a day (TID) | INTRAMUSCULAR | Status: DC
  Administered 2015-05-28 – 2015-06-01 (×7): 5000 [IU] via SUBCUTANEOUS
  Filled 2015-05-28 (×2): qty 1

## 2015-05-28 MED ORDER — AMLODIPINE BESYLATE 5 MG PO TABS
5.0000 mg | ORAL_TABLET | Freq: Every day | ORAL | Status: DC
  Administered 2015-05-29 – 2015-06-05 (×6): 5 mg via ORAL
  Filled 2015-05-28 (×7): qty 1

## 2015-05-28 MED ORDER — ONDANSETRON HCL 4 MG/2ML IJ SOLN
4.0000 mg | Freq: Four times a day (QID) | INTRAMUSCULAR | Status: DC | PRN
  Administered 2015-05-29 – 2015-06-03 (×4): 4 mg via INTRAVENOUS
  Filled 2015-05-28 (×3): qty 2

## 2015-05-28 MED ORDER — POTASSIUM CHLORIDE CRYS ER 20 MEQ PO TBCR
40.0000 meq | EXTENDED_RELEASE_TABLET | Freq: Once | ORAL | Status: AC
  Administered 2015-05-28: 40 meq via ORAL
  Filled 2015-05-28: qty 2

## 2015-05-28 MED ORDER — ONDANSETRON HCL 4 MG PO TABS: 4.0000 mg | ORAL_TABLET | Freq: Four times a day (QID) | ORAL | Status: DC | PRN

## 2015-05-28 MED ORDER — DOCUSATE SODIUM 100 MG PO CAPS
100.0000 mg | ORAL_CAPSULE | Freq: Two times a day (BID) | ORAL | Status: DC
  Administered 2015-05-29 – 2015-06-04 (×8): 100 mg via ORAL
  Filled 2015-05-28 (×10): qty 1

## 2015-05-28 MED ORDER — NICOTINE 21 MG/24HR TD PT24
21.0000 mg | MEDICATED_PATCH | Freq: Every day | TRANSDERMAL | Status: DC
  Administered 2015-05-28 – 2015-06-05 (×9): 21 mg via TRANSDERMAL
  Filled 2015-05-28 (×9): qty 1

## 2015-05-28 MED ORDER — INSULIN ASPART 100 UNIT/ML ~~LOC~~ SOLN
0.0000 [IU] | Freq: Three times a day (TID) | SUBCUTANEOUS | Status: DC
  Administered 2015-05-29: 2 [IU] via SUBCUTANEOUS
  Administered 2015-05-29: 1 [IU] via SUBCUTANEOUS
  Administered 2015-05-29: 2 [IU] via SUBCUTANEOUS
  Administered 2015-05-31 (×3): 1 [IU] via SUBCUTANEOUS
  Administered 2015-06-01 – 2015-06-04 (×4): 2 [IU] via SUBCUTANEOUS
  Administered 2015-06-04: 1 [IU] via SUBCUTANEOUS
  Administered 2015-06-05: 2 [IU] via SUBCUTANEOUS
  Administered 2015-06-05: 1 [IU] via SUBCUTANEOUS
  Administered 2015-06-05: 2 [IU] via SUBCUTANEOUS

## 2015-05-28 MED ORDER — ALUM & MAG HYDROXIDE-SIMETH 200-200-20 MG/5ML PO SUSP: 30.0000 mL | Freq: Four times a day (QID) | ORAL | Status: DC | PRN

## 2015-05-28 MED ORDER — INSULIN ASPART 100 UNIT/ML ~~LOC~~ SOLN
0.0000 [IU] | Freq: Every day | SUBCUTANEOUS | Status: DC
  Administered 2015-05-31: 2 [IU] via SUBCUTANEOUS
  Administered 2015-06-04: 1 [IU] via SUBCUTANEOUS

## 2015-05-28 MED ORDER — ACETAMINOPHEN 650 MG RE SUPP: 650.0000 mg | Freq: Four times a day (QID) | RECTAL | Status: DC | PRN

## 2015-05-28 NOTE — ED Notes (Addendum)
Pt c/o dizziness, nausea, loss of appetite, blurred vision x 2 weeks. Denies chest pain, but c/o constipation.  Her son states she's lost a lot of weight in the last 2 weeks.

## 2015-05-28 NOTE — ED Notes (Signed)
Pt placed on 2L O2 via [place].

## 2015-05-28 NOTE — ED Notes (Signed)
Dr Essie Christine notified of critical HGB,

## 2015-05-28 NOTE — H&P (Signed)
[facility] History and Physical  KANDEE ESCALANTE ZOX:[id number] DOB: 12-Nov-1955 DOA: 05/28/2015  Referring physician: ED physician PCP: No primary care provider on file.   Chief Complaint: weakness, fatigue, constipation  HPI:  Jane Higgins is a 59yo woman with PMH of Bipolar disorder, HTN, DM2 who has not been seen by a doctor in quite a while and has not been on medications for at least 1 year. She presented to the hospital today with her son complaining of about 2 weeks of fatigue, lightheadedness and feeling poorly.  She has not fallen or lost consciousness.  Further symptoms include constipation, her last BM being 2 days ago and was watery, headache, sharp belly pain, nausea and vomiting.  She further notes a chronic "smokers cough," weight loss of about 40 pounds which seems to be rapid (though last time she weighed herself was a year ago), decreased PO intake.  She denies dysphagia, odynophagia, fever, chills, chest pain, SOB, hemoptysis, melena or hematochezia, hematemesis.  She was previously treated for high blood pressure and in 2014 was found to have CKD.  She specifically denies urinary symptoms and notes that she may have been peeing more lately.  She reports no change in urine color and no blood.   In the ED, her BP was found to be elevated into the 180s systolic.  On blood work she was found to have a very high Creatining and BUIN.  She was further noted to have a low K to 2.9.  She has an anion gap metabolic acidosis.  No imaging was done. She was also found to be anemic with a mildly low MCV.   Assessment and Plan: Acute on chronic renal failure - DDx includes hypertensive renal disease, diabetic renal disease, acute myeloma, glomerulonephritis.  Will initiate work up as noted below.  - Last Cr in our system was from 2014 and showed a baseline around 1.6 - Now Cr is 16-18 with BUN in the 130s - BUN to Cr ratio is 8, making intrarenal most likely cause - K and Ca are low,  adding on magnesium, phos and LFTs to assess for albumin - Renal ultrasound - Urine studies with spot protein/cr ratio - SPEP/UPEP - UA without mention of active sediment, > 300 protein, moderate hemoglobin, 3-6 RBC - IVF at 100cc/hr with potassium - Telemetry - Consider nephrology consult in the AM.  She has an elevated BUN, but no uremic symptoms.  She has a low K.  She has a metabolic acidosis, likely related to her BUN.  All of this leads me to believe this may be more chronic than acute, however, it is an impressive rise in Cr. Given hemoglobin in UA and only small amount of RBCs, will check CK as well.   Hypertensive urgency - Start amlodipine, she has likely been elevated at this level for quite a while, so do not want to drop BP too low - Renal dysfunction work up as noted above - IVF with NS    Hypokalemia - Replete with K in IVF as well as orally with KCl 40 meq BID to start.  Recheck K in the AM - Repeat EKG  - Telemetry    Weight loss with chronic cough - CXR to start work up (no CT given renal function) - Her weight loss could be explained by renal dysfunction as noted above, but I am concerned there may be an underlying malignancy - She has not had a mammogram in many years, reports no changes  to breasts - Never been screened for colon cancer, discussed with ED and hemocult done and was negative.   Constipation - Abdominal Xray to start evaluation - Senna/colace    DM2 (diabetes mellitus, type 2) - Check A1C - SSI    Normo to microcytic Anemia - Iron studies, folate, B12 - Trend - Blood transfusion as needed - This may be related to renal dysfunction and could explain her fatigue  Hypocalcemia - LFTs to check albumin - She will likely need Ca replacement.  - Telemetry, EKG as noted above.   Tobacco abuse - Patient requesting nicotine patch, which was provided - Counsel on cessation.    Radiological Exams on Admission: No results found.  Code Status:  Full Family Communication: Pt and son at bedside Disposition Plan: Admit for further evaluation    Jane Coder, MD [phone]   Review of Systems:  Constitutional: + for malaise/fatigue Negative for fever, chills  Negative for diaphoresis.  HENT: + for chronic hearing loss.  Negative for ear pain, nosebleeds, congestion, sore throat, tinnitus Eyes: Negative for blurred vision, double vision, photophobia Respiratory: + for chronic cough, some occasional sputum Negative for hemoptysis, shortness of breath, wheezing.   Cardiovascular: Negative for chest pain, palpitations, orthopnea, claudication and leg swelling.  Gastrointestinal: + for nausea, vomiting, abdominal pain Negative for heartburn, constipation, blood in stool and melena, hematemesis  Genitourinary: Negative for dysuria, urgency, frequency, hematuria and flank pain.  Musculoskeletal: Negative for myalgias, back pain, joint pain and falls.  Skin: Negative for itching and rash.  Neurological: + for lightheadedness, weakness, headache Negative for tingling, tremors, sensory change, speech change, focal weakness, loss of consciousness.      Past Medical History  Diagnosis Date  . Bipolar 1 disorder   . Hypertension   . Diabetes mellitus without complication     Past Surgical History  Procedure Laterality Date  . Tubal ligation      Social History:  reports that she has been smoking Cigarettes.  She has been smoking about 1.00 pack per day. She does not have any smokeless tobacco history on file. She reports that she does not drink alcohol or use illicit drugs.  Allergies  Allergen Reactions  . Iodine Other (See Comments)    unknown    Family History  Problem Relation Age of Onset  . Colon cancer      Neg history  . Kidney disease Cousin     Medications - none                                       Physical Exam: Filed Vitals:   05/28/15 1730 05/28/15 1830 05/28/15 1900 05/28/15 1930  BP: 186/83 189/106  184/80 186/86  Pulse: 72 65 55 58  Temp:      TempSrc:      Resp: 15 13 13 13   Height:      Weight:      SpO2: 100% 100% 100% 100%    Physical Exam  Constitutional: Thin woman, appears older than stated age, oriented and alert, slow to answer questions.  HENT: Normocephalic. Oropharynx is clear and moist.  Eyes: Conjunctivae normal. PERRLA, no scleral icterus.  CVS: mild bradycardia, normal rhythm, S1/S2 +, no murmurs Pulmonary: She had a difficult time complying, would not take deep breaths when asked.  She did not have any obvious wheezing, rales Abdominal: Soft. BS +,  no distension, +  periumbilical tenderness, no CVA tenderness Musculoskeletal: No edema and no tenderness.  Neuro: Alert. Low muscle mass, moving all extremities normally, no asterixis Skin: Skin is warm and dry. No rash noted. Not diaphoretic. No erythema. No pallor.  Psychiatric: Normal mood and affect. Appears fatigued.   Labs on Admission:  Basic Metabolic Panel:  Recent Labs Lab 05/28/15 1526 05/28/15 1642  NA 142 142  K 2.9* 2.8*  CL 105 109  CO2 12*  --   GLUCOSE 213* 224*  BUN 136* 137*  CREATININE 16.97* >18.00*  CALCIUM 6.5*  --    CBC:  Recent Labs Lab 05/28/15 1526 05/28/15 1642  WBC 9.2  --   HGB 6.8* 7.8*  HCT 20.6* 23.0*  MCV 78.9  --   PLT 286  --    CBG:  Recent Labs Lab 05/28/15 1451  GLUCAP 184*    EKG: Frequent PVCs, could not eval for U waves.    If 7PM-7AM, please contact night-coverage [email or web address] Password [email or web address] 05/28/2015, 9:05 PM

## 2015-05-28 NOTE — ED Notes (Signed)
Pt here for eval of loss of appetite, including a 40lb weight loss over the past year. Pt also reports weakness and dizziness.

## 2015-05-28 NOTE — ED Notes (Signed)
Attempted report 

## 2015-05-28 NOTE — ED Provider Notes (Signed)
History   Chief Complaint  Patient presents with  . Dizziness    HPI 59 year old female past medical history as below presents for evaluation of dizziness. Patient describes dizziness as lightheadedness and states is been ongoing for the past week and a half. She is coming by her son is also providing history today. Patient reports that over the past year she's lost a lot of weight; approximately 40 pounds unintentionally. Patient states she has been otherwise in her usual state of health but does note having constipation off and on for the past 1-1/2 weeks. She says she has not had a bowel movement for about 1 week now. Notes that she has decreased flatus and generalized abdominal pain. Pain is rated as mild to moderate. Denies any shortness of breath, chest pain, nausea, vomiting, urinary symptoms, fevers, chills, back pain. Palpation worsens her pain. No history of similar symptoms in the past. No history of vertigo. Patient denies any history of kidney stones. No history of abdominal surgeries. Patient is not on any blood thinners. Denies taking any NSAIDs. No history of peptic ulcer disease. No history of prior transfusions.  Past medical/surgical history, social history, medications, allergies and FH have been reviewed with patient and/or in documentation. Furthermore, if pt family or friend(s) present, additional historical information was obtained from them.  Past Medical History  Diagnosis Date  . Bipolar 1 disorder   . Hypertension   . Diabetes mellitus without complication    History reviewed. No pertinent past surgical history. No family history on file. Social History  Substance Use Topics  . Smoking status: Current Every Day Smoker -- 1.00 packs/day    Types: Cigarettes  . Smokeless tobacco: None  . Alcohol Use: No     Comment: no     Review of Systems Constitutional: - F/C, +fatigue.  HENT: - congestion, -rhinorrhea, -sore throat.   Eyes: - eye pain, -visual  disturbance.  Respiratory: - cough, -SOB, -hemoptysis.   Cardiovascular: - CP, -palps.  Gastrointestinal: - N/V/D, +abd pain , constipation Genitourinary: - flank pain, -dysuria, -frequency.  Musculoskeletal: - myalgia/arthritis, -joint swelling, -gait abnormality, -back pain, -neck pain/stiffness, -leg pain/swelling.  Skin: - rash/lesion.  Neurological: - focal weakness, +lightheadedness, -dizziness, -numbness, -HA.  All other systems reviewed and are negative.   Physical Exam  Physical Exam  ED Triage Vitals  Enc Vitals Group     BP 05/28/15 1432 170/84 mmHg     Pulse Rate 05/28/15 1432 89     Resp 05/28/15 1432 18     Temp 05/28/15 1432 98.5 F (36.9 C)     Temp Source 05/28/15 1432 Oral     SpO2 05/28/15 1432 100 %     Weight 05/28/15 1445 107 lb (48.535 kg)     Height 05/28/15 1445 5\' 5"  (1.651 m)     Head Cir --      Peak Flow --      Pain Score 05/28/15 1442 5     Pain Loc --      Pain Edu? --      Excl. in GC? --    Constitutional: chronically ill appearing 59 year old female who appears to be in no apparent distress. Head: Normocephalic and atraumatic.  Eyes: Extraocular motion intact, no scleral icterus. Conjunctival pallor.  Mouth: MMM, OP clear Neck: Supple without meningismus, mass, or overt JVD Respiratory: No respiratory distress. Normal WOB. No w/r/g. CV: RRR, no obvious murmurs.  Pulses +2 and symmetric. Euvolemic. Brisk cap refill  Abdomen: Soft,  ND, no r/g. No mass.  Nontender abdomen, no tenderness to palpation over McBurney's. Negative Murphy's.  GU: Brown stool no gross blood  MSK: Extremities are atraumatic without deformity, ROM intact Skin: Warm, dry, intact without rash Neuro: AAOx4, MAE 5/5 sym, no focal deficit noted   ED Course  Procedures   Labs Reviewed  BASIC METABOLIC PANEL - Abnormal; Notable for the following:    Potassium 2.9 (*)    CO2 12 (*)    Glucose, Bld 213 (*)    BUN 136 (*)    Creatinine, Ser 16.97 (*)    Calcium 6.5  (*)    GFR calc non Af Amer 2 (*)    GFR calc Af Amer 2 (*)    Anion gap 25 (*)    All other components within normal limits  CBC - Abnormal; Notable for the following:    RBC 2.61 (*)    Hemoglobin 6.8 (*)    HCT 20.6 (*)    All other components within normal limits  CBG MONITORING, ED - Abnormal; Notable for the following:    Glucose-Capillary 184 (*)    All other components within normal limits  I-STAT CHEM 8, ED - Abnormal; Notable for the following:    Potassium 2.8 (*)    BUN 137 (*)    Creatinine, Ser >18.00 (*)    Glucose, Bld 224 (*)    Calcium, Ion 0.75 (*)    Hemoglobin 7.8 (*)    HCT 23.0 (*)    All other components within normal limits  URINE CULTURE  URINALYSIS, ROUTINE W REFLEX MICROSCOPIC (NOT AT [facility])  POC OCCULT BLOOD, ED  PREPARE RBC (CROSSMATCH)  TYPE AND SCREEN   I personally reviewed and interpreted all labs.  X-ray Chest Pa And Lateral  05/28/2015   CLINICAL DATA:  Two weeks of fatigue and lightheadedness. Acute and chronic renal failure.  EXAM: CHEST  2 VIEW  COMPARISON:  04/13/2013  FINDINGS: Borderline cardiomegaly is stable. Negative aortic and hilar contours. There is no edema, consolidation, effusion, or pneumothorax. Left nipple shadow noted.  IMPRESSION: 1. No acute finding or change from 2014. 2. Borderline cardiomegaly.   Electronically Signed   By: Marnee Spring M.D.   On: 05/28/2015 21:55   Abd 1 View (kub)  05/28/2015   CLINICAL DATA:  Acute on chronic renal failure.  Constipation.  EXAM: ABDOMEN - 1 VIEW  COMPARISON:  None.  FINDINGS: There is oral contrast from the stomach to distal colon, presumably in anticipation of CT. The bowel gas pattern is nonobstructive. No concerning intra-abdominal mass effect or calcification. Lung bases are clear. No acute osseous findings.  IMPRESSION: Negative.   Electronically Signed   By: Marnee Spring M.D.   On: 05/28/2015 21:54   I personally viewed above image(s) which were used in my medical decision  making. Formal interpretations by Radiology.   EKG Interpretation  Date/Time:  Saturday May 28 2015 14:38:59 EDT Ventricular Rate:  77 PR Interval:  192 QRS Duration: 118 QT Interval:  432 QTC Calculation: 488 R Axis:     Text Interpretation:  Sinus rhythm with frequent Premature ventricular complexes Incomplete left bundle branch block T wave abnormality, consider lateral ischemia Prolonged QT No significant change since last tracing Confirmed by Anitra Lauth  MD, Alphonzo Lemmings ([id number]) on 05/28/2015 4:25:00 PM       MDM: JUDY POLLMAN is a 59 y.o. female with H&P as above who p/w CC: dizziness  On arrival, patient is hemodynamic stable  and in no apparent distress. Her exam is notable for conjunctival pallor, diffuse abdominal tenderness without signs of peritonitis. She is Hemoccult-negative on exam.   Screening labs were sent for further evaluation.  Screening labs are notable for significant AKI and hemoglobin of 6.8. Type and screen was sent. Redraw was sent to confirm this was accurate and shows creatinine of 18. BUN 137. Repeat hemoglobin is 7.8. Patient without urinary symptoms and is eating and drinking normally. Patient's only complaint is constipation.   Patient will require admission for acute renal failure. Patient was admitted to hospitalist. Stable while in ED.  Old records reviewed (if available). Labs and imaging reviewed personally by myself and considered in medical decision making if ordered.  Clinical Impression: 1. Acute on chronic renal failure   2. Acute on chronic renal failure   3. Acute on chronic renal failure     Disposition: Admit  Condition: stable  I have discussed the results, Dx and Tx plan with the pt(& family if present). He/she/they expressed understanding and agree(s) with the plan.  Pt seen in conjunction with Dr. Estanislado Emms, DO [facility] Emergency Medicine Resident - PGY-3     Ames Dura, MD 05/29/15 1610  Gwyneth Sprout, MD 05/29/15 2310

## 2015-05-29 DIAGNOSIS — E119 Type 2 diabetes mellitus without complications: Secondary | ICD-10-CM

## 2015-05-29 LAB — BLOOD GAS, VENOUS

## 2015-05-29 LAB — C DIFFICILE QUICK SCREEN W PCR REFLEX
C DIFFICILE (CDIFF) INTERP: NEGATIVE
C DIFFICILE (CDIFF) TOXIN: NEGATIVE
C DIFFICLE (CDIFF) ANTIGEN: NEGATIVE

## 2015-05-29 LAB — PROTEIN / CREATININE RATIO, URINE
Creatinine, Urine: 59.19 mg/dL
Protein Creatinine Ratio: 3.51 mg/mg{Cre} — ABNORMAL HIGH (ref 0.00–0.15)
Total Protein, Urine: 208 mg/dL

## 2015-05-29 LAB — COMPREHENSIVE METABOLIC PANEL
ALT: 9 U/L — AB (ref 14–54)
AST: 10 U/L — ABNORMAL LOW (ref 15–41)
Albumin: 3.5 g/dL (ref 3.5–5.0)
Alkaline Phosphatase: 59 U/L (ref 38–126)
Anion gap: 23 — ABNORMAL HIGH (ref 5–15)
BILIRUBIN TOTAL: 0.7 mg/dL (ref 0.3–1.2)
BUN: 134 mg/dL — ABNORMAL HIGH (ref 6–20)
CALCIUM: 6.3 mg/dL — AB (ref 8.9–10.3)
CHLORIDE: 108 mmol/L (ref 101–111)
CO2: 11 mmol/L — ABNORMAL LOW (ref 22–32)
CREATININE: 16.72 mg/dL — AB (ref 0.44–1.00)
GFR, EST AFRICAN AMERICAN: 2 mL/min — AB (ref >60)
GFR, EST NON AFRICAN AMERICAN: 2 mL/min — AB (ref >60)
Glucose, Bld: 145 mg/dL — ABNORMAL HIGH (ref 65–99)
Potassium: 3.4 mmol/L — ABNORMAL LOW (ref 3.5–5.1)
Sodium: 142 mmol/L (ref 135–145)
TOTAL PROTEIN: 6.1 g/dL — AB (ref 6.5–8.1)

## 2015-05-29 LAB — HEMOGLOBIN AND HEMATOCRIT, BLOOD
HCT: 22.2 % — ABNORMAL LOW (ref 36.0–46.0)
HEMOGLOBIN: 7.4 g/dL — AB (ref 12.0–15.0)

## 2015-05-29 LAB — GLUCOSE, CAPILLARY
Glucose-Capillary: 180 mg/dL — ABNORMAL HIGH (ref 65–99)
Glucose-Capillary: 187 mg/dL — ABNORMAL HIGH (ref 65–99)
Glucose-Capillary: 137 mg/dL — ABNORMAL HIGH (ref 65–99)
Glucose-Capillary: 133 mg/dL — ABNORMAL HIGH (ref 65–99)

## 2015-05-29 LAB — PREPARE RBC (CROSSMATCH)

## 2015-05-29 MED ORDER — SODIUM CHLORIDE 0.9 % IV SOLN
Freq: Once | INTRAVENOUS | Status: AC
  Administered 2015-05-29: 08:00:00 via INTRAVENOUS

## 2015-05-29 MED ORDER — CALCIUM ACETATE (PHOS BINDER) 667 MG PO CAPS
1334.0000 mg | ORAL_CAPSULE | Freq: Three times a day (TID) | ORAL | Status: DC
  Administered 2015-05-29 – 2015-06-05 (×17): 1334 mg via ORAL
  Filled 2015-05-29 (×15): qty 2

## 2015-05-29 MED ORDER — HYDRALAZINE HCL 20 MG/ML IJ SOLN
10.0000 mg | Freq: Four times a day (QID) | INTRAMUSCULAR | Status: DC | PRN
  Administered 2015-05-31: 10 mg via INTRAVENOUS
  Filled 2015-05-29: qty 1

## 2015-05-29 MED ORDER — ENSURE ENLIVE PO LIQD
237.0000 mL | Freq: Two times a day (BID) | ORAL | Status: DC
  Administered 2015-05-29 (×2): 237 mL via ORAL

## 2015-05-29 MED ORDER — SODIUM CHLORIDE 0.9 % IV SOLN
Freq: Once | INTRAVENOUS | Status: AC
  Administered 2015-05-29: 10:00:00 via INTRAVENOUS

## 2015-05-29 MED ORDER — HYDRALAZINE HCL 20 MG/ML IJ SOLN
5.0000 mg | Freq: Four times a day (QID) | INTRAMUSCULAR | Status: DC | PRN
  Administered 2015-05-29: 5 mg via INTRAVENOUS
  Filled 2015-05-29: qty 1

## 2015-05-29 MED ORDER — CALCITRIOL 0.25 MCG PO CAPS
0.2500 ug | ORAL_CAPSULE | Freq: Every day | ORAL | Status: DC
  Administered 2015-05-29 – 2015-06-02 (×3): 0.25 ug via ORAL
  Filled 2015-05-29 (×4): qty 1

## 2015-05-29 NOTE — Consult Note (Signed)
Full note to follow by Dr. Christinia Gully. This patient has new onset ESRD.  Had stage 3 CKD in 2014.  Has not had medical follow up other than HH since that time.  She has weight loss, nause, some vomiting,anorexia, foul taste in mouth and malaise.  We had an extensive discussion with her about the need for dialysis.  Will plan PC placement, then AV access later in the week, and the Clip process.  Her adherence was emphasized.  She has taken Risperdal in the past given to her by Dr. Shana Chute.  Hope to initiate HD in AM and try to avoid disequilibrium. Jane Higgins C

## 2015-05-29 NOTE — Progress Notes (Signed)
Pt had critical hemoglobin of 6.7. Dr. Cena Benton notified. Orders given. Will continue to monitor pt.

## 2015-05-29 NOTE — Progress Notes (Signed)
Utilization review completed.  

## 2015-05-29 NOTE — Progress Notes (Signed)
Pt and family given handouts on hemodialysis and diet. RN explained handouts. Pt and family stated they had no questions at the given time.RN offered to show pt and family Kidney Failure/ HD videos. Pt and family declined at time. Will attempt at later time.   Jonell Cluck, RN

## 2015-05-29 NOTE — Progress Notes (Addendum)
During blood administration, Pt complained of shortness of breath. Blood stopped. VS taken.  MD paged and ordered to stop transfusion at this time. Upon further RN examination pt states that she was not short of breath but experiencing epigastric discomfort. MD made aware. Blood bank made aware; stated that blood did not need to be returned.   MD came to see pt. Given instructions to restart blood and continue blood transfusion. Will continue to monitor pt.    Jane Higgins

## 2015-05-29 NOTE — Progress Notes (Signed)
[facility] PROGRESS NOTE  Jane Higgins ZOX:[id number] DOB: 1956-08-18 DOA: 05/28/2015 PCP: No primary care provider on file.  Assessment/Plan: Principal Problem:   ARF (acute renal failure) - Nephrology consulted  - Pt currently undergoing work up - Renal ultrasound reports no evidence of hydronephrosis - continue renal diet  Active Problems:   Hypertensive urgency - resolving. Patient is currently on amlodipine - Hydralazine on board prn    Hypokalemia - improved on last check and mild currently. - reassess next am.    DM2 (diabetes mellitus, type 2) - Currently on SSI    Anemia - Most likely due to Renal failure - transfuse 1 unit given symptomatic anemia   Code Status: full Family Communication: no family at bedside.  Disposition Plan: Pending improvement in condition.   Consultants:  Nephrology  Procedures:  None  Antibiotics:  None  HPI/Subjective: Pt has no new complaints. No acute issues overnight.  Objective: Filed Vitals:   05/29/15 1115  BP: 170/66  Pulse: 74  Temp: 97.9 F (36.6 C)  Resp: 18    Intake/Output Summary (Last 24 hours) at 05/29/15 1216 Last data filed at 05/29/15 8119  Gross per 24 hour  Intake    240 ml  Output   1050 ml  Net   -810 ml   Filed Weights   05/28/15 1445 05/28/15 2102  Weight: 48.535 kg (107 lb) 51.03 kg (112 lb 8 oz)    Exam:   General:  Pt in nad, alert and awake  Cardiovascular: rrr, no mrg  Respiratory: cta bl, no wheezes  Abdomen: soft, ND, NT  Musculoskeletal: no cyanosis or clubbing   Data Reviewed: Basic Metabolic Panel:  Recent Labs Lab 05/28/15 1526 05/28/15 1642 05/28/15 2203 05/29/15 0545  NA 142 142  --  142  K 2.9* 2.8*  --  3.4*  CL 105 109  --  108  CO2 12*  --   --  11*  GLUCOSE 213* 224*  --  145*  BUN 136* 137*  --  134*  CREATININE 16.97* >18.00*  --  16.72*  CALCIUM 6.5*  --   --  6.3*  MG  --   --  1.8  --   PHOS  --   --  13.8*  --    Liver  Function Tests:  Recent Labs Lab 05/28/15 2203 05/29/15 0545  AST 9* 10*  ALT 9* 9*  ALKPHOS 63 59  BILITOT 0.5 0.7  PROT 6.8 6.1*  ALBUMIN 3.6 3.5   No results for input(s): LIPASE, AMYLASE in the last 168 hours. No results for input(s): AMMONIA in the last 168 hours. CBC:  Recent Labs Lab 05/28/15 1526 05/28/15 1642 05/29/15 0545  WBC 9.2  --  10.4  HGB 6.8* 7.8* 6.7*  HCT 20.6* 23.0* 20.6*  MCV 78.9  --  79.5  PLT 286  --  267   Cardiac Enzymes:  Recent Labs Lab 05/28/15 2203  CKTOTAL 330*   BNP (last 3 results) No results for input(s): BNP in the last 8760 hours.  ProBNP (last 3 results) No results for input(s): PROBNP in the last 8760 hours.  CBG:  Recent Labs Lab 05/28/15 1451 05/28/15 2101 05/29/15 0754 05/29/15 1131  GLUCAP 184* 82 180* 187*    Recent Results (from the past 240 hour(s))  C difficile quick scan w PCR reflex     Status: None   Collection Time: 05/29/15  9:10 AM  Result Value Ref Range Status   C  Diff antigen NEGATIVE NEGATIVE Final   C Diff toxin NEGATIVE NEGATIVE Final   C Diff interpretation Negative for toxigenic C. difficile  Final     Studies: X-ray Chest Pa And Lateral  05/28/2015   CLINICAL DATA:  Two weeks of fatigue and lightheadedness. Acute and chronic renal failure.  EXAM: CHEST  2 VIEW  COMPARISON:  04/13/2013  FINDINGS: Borderline cardiomegaly is stable. Negative aortic and hilar contours. There is no edema, consolidation, effusion, or pneumothorax. Left nipple shadow noted.  IMPRESSION: 1. No acute finding or change from 2014. 2. Borderline cardiomegaly.   Electronically Signed   By: Marnee Spring M.D.   On: 05/28/2015 21:55   Abd 1 View (kub)  05/28/2015   CLINICAL DATA:  Acute on chronic renal failure.  Constipation.  EXAM: ABDOMEN - 1 VIEW  COMPARISON:  None.  FINDINGS: There is oral contrast from the stomach to distal colon, presumably in anticipation of CT. The bowel gas pattern is nonobstructive. No  concerning intra-abdominal mass effect or calcification. Lung bases are clear. No acute osseous findings.  IMPRESSION: Negative.   Electronically Signed   By: Marnee Spring M.D.   On: 05/28/2015 21:54   US Renal  05/29/2015   CLINICAL DATA:  Acute on chronic renal failure.  Initial encounter.  EXAM: RENAL / URINARY TRACT ULTRASOUND COMPLETE  COMPARISON:  Abdominal radiograph performed earlier today at 9:29 p.m.  FINDINGS: Right Kidney:  Length: 11.0 cm. Diffusely increased parenchymal echogenicity noted. No mass or hydronephrosis visualized.  Left Kidney:  Length: 9.4 cm. Diffusely increased parenchymal echogenicity noted. No mass or hydronephrosis visualized.  Bladder:  Largely decompressed and not well assessed.  IMPRESSION: 1. No evidence of hydronephrosis. 2. Diffusely increased renal parenchymal echogenicity likely reflects medical renal disease.   Electronically Signed   By: Roanna Raider M.D.   On: 05/29/2015 03:38    Scheduled Meds: . amLODipine  5 mg Oral Daily  . calcitRIOL  0.25 mcg Oral Daily  . calcium acetate  1,334 mg Oral TID WC  . docusate sodium  100 mg Oral BID  . feeding supplement (ENSURE ENLIVE)  237 mL Oral BID BM  . heparin  5,000 Units Subcutaneous 3 times per day  . insulin aspart  0-5 Units Subcutaneous QHS  . insulin aspart  0-9 Units Subcutaneous TID WC  . nicotine  21 mg Transdermal Daily  . sodium chloride  3 mL Intravenous Q12H   Continuous Infusions: . 0.9 % NaCl with KCl 20 mEq / L 100 mL/hr at 05/28/15 2306    Time spent: > 35 minutes   Penny Pia  [facility] Pager [phone] If 7PM-7AM, please contact night-coverage at [email or web address], password [email or web address] 05/29/2015, 12:16 PM  LOS: 1 day

## 2015-05-29 NOTE — Consult Note (Signed)
HPI:  Jane Higgins is a 59 year old female with poorly controlled DM2, HTN, bipolar disorder type 1, tobacco abuse who presented to the ED overnight with her son with several weeks of decreased energy, fatigue, lightheadedness. She reports constipation was a reason for her to come to the hospital. She was last hospitalized back in July 2014 for status epilepticus which warranted intubation and ICU stay. Since that discharge, she has not had been followed by any medical provider other than home health nursing per her report nor has she taken any medications. During this interval, she also reported unintentional weight loss as her weight on admission was 107 pounds, down from 141 pounds at her last hospitalization. She also reports change in taste and poor appetite though chest pain, dyspnea, swelling, nocturnal pain in her extremities. Admission labs were notable for BUN 136, creatinine 17 [baseline 1.6-1.8], microcytic anemia [hemoglobin 6.8, baseline 11-10]..  Nephrology was consulted for further evaluation and management of her kidney failure.    Past Medical History  Diagnosis Date  . Bipolar 1 disorder   . Hypertension   . Diabetes mellitus without complication    Past Surgical History  Procedure Laterality Date  . Tubal ligation     Social History:  reports that she has been smoking Cigarettes.  She has been smoking about 1.00 pack per day. She does not have any smokeless tobacco history on file. She reports that she does not drink alcohol or use illicit drugs. Allergies:  Allergies  Allergen Reactions  . Iodine Other (See Comments)    unknown   Family History  Problem Relation Age of Onset  . Colon cancer      Neg history  . Kidney disease Cousin     Medications:  Scheduled: . amLODipine  5 mg Oral Daily  . calcitRIOL  0.25 mcg Oral Daily  . calcium acetate  1,334 mg Oral TID WC  . docusate sodium  100 mg Oral BID  . feeding supplement (ENSURE ENLIVE)  237 mL Oral BID BM  .  heparin  5,000 Units Subcutaneous 3 times per day  . insulin aspart  0-5 Units Subcutaneous QHS  . insulin aspart  0-9 Units Subcutaneous TID WC  . nicotine  21 mg Transdermal Daily  . sodium chloride  3 mL Intravenous Q12H    ROS: as in history of present illness   Blood pressure 187/74, pulse 70, temperature 98 F (36.7 C), temperature source Oral, resp. rate 17, height 5\' 5"  (1.651 m), weight 112 lb 8 oz (51.03 kg), SpO2 100 %.  General:  Middle-aged African-American female,resting in bed, NAD HEENT: PERRL, EOMI, no scleral icterus, oropharynx  with moist mucous membranes Cardiac:  Irregular rate, no rubs, murmurs or gallops Pulm: clear to auscultation bilaterally, no wheezes, rales, or rhonchi Abd: soft, nontender, nondistended, BS present Ext: warm and well perfused, no pedal edema Neuro:  Alert and oriented to name, place, year, president moving all extremities freely   Results for orders placed or performed during the hospital encounter of 05/28/15 (from the past 48 hour(s))  CBG monitoring, ED     Status: Abnormal   Collection Time: 05/28/15  2:51 PM  Result Value Ref Range   Glucose-Capillary 184 (H) 65 - 99 mg/dL  Basic metabolic panel     Status: Abnormal   Collection Time: 05/28/15  3:26 PM  Result Value Ref Range   Sodium 142 135 - 145 mmol/L   Potassium 2.9 (L) 3.5 - 5.1 mmol/L  Chloride 105 101 - 111 mmol/L   CO2 12 (L) 22 - 32 mmol/L   Glucose, Bld 213 (H) 65 - 99 mg/dL   BUN 136 (H) 6 - 20 mg/dL   Creatinine, Ser 16.97 (H) 0.44 - 1.00 mg/dL   Calcium 6.5 (L) 8.9 - 10.3 mg/dL   GFR calc non Af Amer 2 (L) >60 mL/min   GFR calc Af Amer 2 (L) >60 mL/min    Comment: (NOTE) The eGFR has been calculated using the CKD EPI equation. This calculation has not been validated in all clinical situations. eGFR's persistently <60 mL/min signify possible Chronic Kidney Disease.    Anion gap 25 (H) 5 - 15  CBC     Status: Abnormal   Collection Time: 05/28/15  3:26 PM   Result Value Ref Range   WBC 9.2 4.0 - 10.5 K/uL   RBC 2.61 (L) 3.87 - 5.11 MIL/uL   Hemoglobin 6.8 (LL) 12.0 - 15.0 g/dL    Comment: REPEATED TO VERIFY CRITICAL RESULT CALLED TO, READ BACK BY AND VERIFIED WITH: RN MICHAELSON,B AT 1547 [id number] MARTINB    HCT 20.6 (L) 36.0 - 46.0 %   MCV 78.9 78.0 - 100.0 fL   MCH 26.1 26.0 - 34.0 pg   MCHC 33.0 30.0 - 36.0 g/dL   RDW 15.5 11.5 - 15.5 %   Platelets 286 150 - 400 K/uL  POC occult blood, ED     Status: None   Collection Time: 05/28/15  4:03 PM  Result Value Ref Range   Fecal Occult Bld NEGATIVE NEGATIVE  Type and screen     Status: None (Preliminary result)   Collection Time: 05/28/15  4:30 PM  Result Value Ref Range   ABO/RH(D) A POS    Antibody Screen NEG    Sample Expiration 05/31/2015    Unit Number [id number]    Blood Component Type RBC LR PHER2    Unit division 00    Status of Unit ISSUED    Transfusion Status OK TO TRANSFUSE    Crossmatch Result Compatible   ABO/Rh     Status: None   Collection Time: 05/28/15  4:30 PM  Result Value Ref Range   ABO/RH(D) A POS   I-Stat Chem 8, ED     Status: Abnormal   Collection Time: 05/28/15  4:42 PM  Result Value Ref Range   Sodium 142 135 - 145 mmol/L   Potassium 2.8 (L) 3.5 - 5.1 mmol/L   Chloride 109 101 - 111 mmol/L   BUN 137 (H) 6 - 20 mg/dL   Creatinine, Ser >18.00 (H) 0.44 - 1.00 mg/dL   Glucose, Bld 224 (H) 65 - 99 mg/dL   Calcium, Ion 0.75 (L) 1.13 - 1.30 mmol/L   TCO2 10 0 - 100 mmol/L   Hemoglobin 7.8 (L) 12.0 - 15.0 g/dL   HCT 23.0 (L) 36.0 - 46.0 %  Urinalysis, Routine w reflex microscopic (not at [facility])     Status: Abnormal   Collection Time: 05/28/15  6:15 PM  Result Value Ref Range   Color, Urine YELLOW YELLOW   APPearance CLOUDY (A) CLEAR   Specific Gravity, Urine 1.010 1.005 - 1.030   pH 5.0 5.0 - 8.0   Glucose, UA 100 (A) NEGATIVE mg/dL   Hgb urine dipstick MODERATE (A) NEGATIVE   Bilirubin Urine NEGATIVE NEGATIVE   Ketones, ur NEGATIVE NEGATIVE  mg/dL   Protein, ur >300 (A) NEGATIVE mg/dL   Urobilinogen, UA 0.2 0.0 - 1.0 mg/dL  Nitrite NEGATIVE NEGATIVE   Leukocytes, UA TRACE (A) NEGATIVE  Urine culture     Status: None (Preliminary result)   Collection Time: 05/28/15  6:15 PM  Result Value Ref Range   Specimen Description URINE, RANDOM    Special Requests NONE    Culture NO GROWTH < 24 HOURS    Report Status PENDING   Urine microscopic-add on     Status: Abnormal   Collection Time: 05/28/15  6:15 PM  Result Value Ref Range   Squamous Epithelial / LPF MANY (A) RARE   WBC, UA 7-10 <3 WBC/hpf   RBC / HPF 3-6 <3 RBC/hpf   Bacteria, UA RARE RARE  Protein / creatinine ratio, urine     Status: Abnormal   Collection Time: 05/28/15  6:15 PM  Result Value Ref Range   Creatinine, Urine 59.19 mg/dL   Total Protein, Urine 208 mg/dL    Comment: NO NORMAL RANGE ESTABLISHED FOR THIS TEST RESULTS CONFIRMED BY MANUAL DILUTION    Protein Creatinine Ratio 3.51 (H) 0.00 - 0.15 mg/mg[Cre]  Glucose, capillary     Status: None   Collection Time: 05/28/15  9:01 PM  Result Value Ref Range   Glucose-Capillary 82 65 - 99 mg/dL  Magnesium     Status: None   Collection Time: 05/28/15 10:03 PM  Result Value Ref Range   Magnesium 1.8 1.7 - 2.4 mg/dL  Phosphorus     Status: Abnormal   Collection Time: 05/28/15 10:03 PM  Result Value Ref Range   Phosphorus 13.8 (H) 2.5 - 4.6 mg/dL    Comment: RESULTS CONFIRMED BY MANUAL DILUTION  TSH     Status: None   Collection Time: 05/28/15 10:03 PM  Result Value Ref Range   TSH 3.773 0.350 - 4.500 uIU/mL  Vitamin B12     Status: Abnormal   Collection Time: 05/28/15 10:03 PM  Result Value Ref Range   Vitamin B-12 1339 (H) 180 - 914 pg/mL    Comment: (NOTE) This assay is not validated for testing neonatal or myeloproliferative syndrome specimens for Vitamin B12 levels.   Folate     Status: None   Collection Time: 05/28/15 10:03 PM  Result Value Ref Range   Folate 14.7 >5.9 ng/mL  Iron and TIBC      Status: Abnormal   Collection Time: 05/28/15 10:03 PM  Result Value Ref Range   Iron 144 28 - 170 ug/dL   TIBC 172 (L) 250 - 450 ug/dL   Saturation Ratios 84 (H) 10.4 - 31.8 %   UIBC 28 ug/dL  Ferritin     Status: Abnormal   Collection Time: 05/28/15 10:03 PM  Result Value Ref Range   Ferritin 440 (H) 11 - 307 ng/mL  Reticulocytes     Status: Abnormal   Collection Time: 05/28/15 10:03 PM  Result Value Ref Range   Retic Ct Pct 1.9 0.4 - 3.1 %   RBC. 2.57 (L) 3.87 - 5.11 MIL/uL   Retic Count, Manual 48.8 19.0 - 186.0 K/uL  CK     Status: Abnormal   Collection Time: 05/28/15 10:03 PM  Result Value Ref Range   Total CK 330 (H) 38 - 234 U/L  Hepatic function panel     Status: Abnormal   Collection Time: 05/28/15 10:03 PM  Result Value Ref Range   Total Protein 6.8 6.5 - 8.1 g/dL   Albumin 3.6 3.5 - 5.0 g/dL   AST 9 (L) 15 - 41 U/L   ALT 9 (L)  14 - 54 U/L   Alkaline Phosphatase 63 38 - 126 U/L   Total Bilirubin 0.5 0.3 - 1.2 mg/dL   Bilirubin, Direct 0.1 0.1 - 0.5 mg/dL   Indirect Bilirubin 0.4 0.3 - 0.9 mg/dL  Blood gas, venous     Status: None   Collection Time: 05/29/15 12:32 AM  Result Value Ref Range   FIO2 TEST WILL BE CREDITED    O2 Content TEST WILL BE CREDITED L/min   Delivery systems TEST WILL BE CREDITED    Mode TEST WILL BE CREDITED    VT TEST WILL BE CREDITED mL   LHR TEST WILL BE CREDITED resp/min   Hi Frequency JET Vent Rate TEST WILL BE CREDITED    Peep/cpap TEST WILL BE CREDITED cm H20   PIP TEST WILL BE CREDITED cm H2O   Hi Frequency JET Vent PIP TEST WILL BE CREDITED    Pressure support TEST WILL BE CREDITED cm H20   Pressure control TEST WILL BE CREDITED cm H20   pH, Ven TEST WILL BE CREDITED 7.250 - 7.300   pCO2, Ven TEST WILL BE CREDITED 45.0 - 50.0 mmHg   pO2, Ven TEST WILL BE CREDITED 30.0 - 45.0 mmHg   Bicarbonate TEST WILL BE CREDITED 20.0 - 24.0 mEq/L   TCO2 TEST WILL BE CREDITED 0 - 100 mmol/L   Acid-Base Excess TEST WILL BE CREDITED 0.0 -  2.0 mmol/L   Acid-base deficit TEST WILL BE CREDITED 0.0 - 2.0 mmol/L   O2 Saturation TEST WILL BE CREDITED %   Patient temperature TEST WILL BE CREDITED    Amplitude TEST WILL BE CREDITED    Map TEST WILL BE CREDITED cmH20   Hertz TEST WILL BE CREDITED    Nitric Oxide TEST WILL BE CREDITED    Collection site TEST WILL BE CREDITED    Drawn by TEST WILL BE CREDITED    Sample type TEST WILL BE CREDITED    Mechanical Rate TEST WILL BE CREDITED   CBC     Status: Abnormal   Collection Time: 05/29/15  5:45 AM  Result Value Ref Range   WBC 10.4 4.0 - 10.5 K/uL   RBC 2.59 (L) 3.87 - 5.11 MIL/uL   Hemoglobin 6.7 (LL) 12.0 - 15.0 g/dL    Comment: CRITICAL RESULT CALLED TO, READ BACK BY AND VERIFIED WITH: Carole Civil RN AT 0736 05/29/15 BY WOOLLENK    HCT 20.6 (L) 36.0 - 46.0 %   MCV 79.5 78.0 - 100.0 fL   MCH 25.9 (L) 26.0 - 34.0 pg   MCHC 32.5 30.0 - 36.0 g/dL   RDW 15.7 (H) 11.5 - 15.5 %   Platelets 267 150 - 400 K/uL  Comprehensive metabolic panel     Status: Abnormal   Collection Time: 05/29/15  5:45 AM  Result Value Ref Range   Sodium 142 135 - 145 mmol/L   Potassium 3.4 (L) 3.5 - 5.1 mmol/L    Comment: DELTA CHECK NOTED   Chloride 108 101 - 111 mmol/L   CO2 11 (L) 22 - 32 mmol/L   Glucose, Bld 145 (H) 65 - 99 mg/dL   BUN 134 (H) 6 - 20 mg/dL   Creatinine, Ser 16.72 (H) 0.44 - 1.00 mg/dL   Calcium 6.3 (LL) 8.9 - 10.3 mg/dL    Comment: CRITICAL RESULT CALLED TO, READ BACK BY AND VERIFIED WITH: WICKER C,RN 05/29/15 0635 WAYK    Total Protein 6.1 (L) 6.5 - 8.1 g/dL   Albumin 3.5  3.5 - 5.0 g/dL   AST 10 (L) 15 - 41 U/L   ALT 9 (L) 14 - 54 U/L   Alkaline Phosphatase 59 38 - 126 U/L   Total Bilirubin 0.7 0.3 - 1.2 mg/dL   GFR calc non Af Amer 2 (L) >60 mL/min   GFR calc Af Amer 2 (L) >60 mL/min    Comment: (NOTE) The eGFR has been calculated using the CKD EPI equation. This calculation has not been validated in all clinical situations. eGFR's persistently <60 mL/min  signify possible Chronic Kidney Disease.    Anion gap 23 (H) 5 - 15  Glucose, capillary     Status: Abnormal   Collection Time: 05/29/15  7:54 AM  Result Value Ref Range   Glucose-Capillary 180 (H) 65 - 99 mg/dL  Prepare RBC     Status: None   Collection Time: 05/29/15  8:04 AM  Result Value Ref Range   Order Confirmation ORDER PROCESSED BY BLOOD BANK   C difficile quick scan w PCR reflex     Status: None   Collection Time: 05/29/15  9:10 AM  Result Value Ref Range   C Diff antigen NEGATIVE NEGATIVE   C Diff toxin NEGATIVE NEGATIVE   C Diff interpretation Negative for toxigenic C. difficile   Glucose, capillary     Status: Abnormal   Collection Time: 05/29/15 11:31 AM  Result Value Ref Range   Glucose-Capillary 187 (H) 65 - 99 mg/dL   X-ray Chest Pa And Lateral  05/28/2015   CLINICAL DATA:  Two weeks of fatigue and lightheadedness. Acute and chronic renal failure.  EXAM: CHEST  2 VIEW  COMPARISON:  04/13/2013  FINDINGS: Borderline cardiomegaly is stable. Negative aortic and hilar contours. There is no edema, consolidation, effusion, or pneumothorax. Left nipple shadow noted.  IMPRESSION: 1. No acute finding or change from 2014. 2. Borderline cardiomegaly.   Electronically Signed   By: Monte Fantasia M.D.   On: 05/28/2015 21:55   Abd 1 View (kub)  05/28/2015   CLINICAL DATA:  Acute on chronic renal failure.  Constipation.  EXAM: ABDOMEN - 1 VIEW  COMPARISON:  None.  FINDINGS: There is oral contrast from the stomach to distal colon, presumably in anticipation of CT. The bowel gas pattern is nonobstructive. No concerning intra-abdominal mass effect or calcification. Lung bases are clear. No acute osseous findings.  IMPRESSION: Negative.   Electronically Signed   By: Monte Fantasia M.D.   On: 05/28/2015 21:54   US Renal  05/29/2015   CLINICAL DATA:  Acute on chronic renal failure.  Initial encounter.  EXAM: RENAL / URINARY TRACT ULTRASOUND COMPLETE  COMPARISON:  Abdominal radiograph  performed earlier today at 9:29 p.m.  FINDINGS: Right Kidney:  Length: 11.0 cm. Diffusely increased parenchymal echogenicity noted. No mass or hydronephrosis visualized.  Left Kidney:  Length: 9.4 cm. Diffusely increased parenchymal echogenicity noted. No mass or hydronephrosis visualized.  Bladder:  Largely decompressed and not well assessed.  IMPRESSION: 1. No evidence of hydronephrosis. 2. Diffusely increased renal parenchymal echogenicity likely reflects medical renal disease.   Electronically Signed   By: Garald Balding M.D.   On: 05/29/2015 03:38    Assessment & Plan:   #1 end-stage renal disease with anion gap acidosis secondary to uremia: Likely in the setting of poorly controlled hypertension and diabetes given that she is asymptomatic from her degree of uremia. No other causes identified on renal ultrasound. Given her smoking history and weight loss, hematologic malignancy is a possibility,  especially given elevated vitamin B12 which is not typically associated with ESRD. Extensive time was spent educating the patient and caregivers about her disease and the need for dialysis, but patient expressed poor insight into her condition and wanted additional opinions from other family members. She was advised to pursue dialysis given the level of uremia in her system -Check CBC with differential and save peripheral smear for review.  -Check renal function panel tomorrow -Have RN provide family and patient with dialysis education materials -Consult IR for HD catheter placement and make NPO at midnight -Order vein mapping for tomorrow and consult vascular surgery accordingly  #2 anemia of chronic disease: Consistent with elevated ferritin, normal iron on admission likely in the setting of ESRD though concerns for malignancy noted above. Received PRBC times one for hemoglobin 6.8 on admission which is decreased from baseline 10-11.  #3 metabolic bone disease: PTH, vitamin D pending. Phosphorus markedly  elevated and low calcium with some EKG changes giving QTC prolongation. Started calcitriol and will continue PhosLo.  #4 hypertension: Amlodipine 5 mg and as needed hydralazine per primary team.   #5 type 2 diabetes: A1c 13.3 back in July 2014. Management per primary team.  #6 type I bipolar disorder: Self-reported history of Risperdal though nonadherence secondary to cost. Consider psychiatry consult though will defer to primary.    Jane Higgins, Jane Higgins 05/29/2015, 2:55 PM   Jane Higgins C

## 2015-05-29 NOTE — Progress Notes (Signed)
Labs returned -   Phosphorous > 13, Ionized Ca 0.75.  Start phoslo (calcium acetate) and calcitriol.  Added PTH, vitamin D levels.  AM EKG.  CK mildly elevated at 330.  She is on fluids overnight.   Debe Coder, MD

## 2015-05-30 ENCOUNTER — Inpatient Hospital Stay (HOSPITAL_COMMUNITY): Payer: Medicare Other

## 2015-05-30 ENCOUNTER — Encounter (HOSPITAL_COMMUNITY): Payer: Self-pay | Admitting: Radiology

## 2015-05-30 DIAGNOSIS — N185 Chronic kidney disease, stage 5: Secondary | ICD-10-CM

## 2015-05-30 DIAGNOSIS — Z992 Dependence on renal dialysis: Secondary | ICD-10-CM

## 2015-05-30 DIAGNOSIS — N186 End stage renal disease: Secondary | ICD-10-CM | POA: Insufficient documentation

## 2015-05-30 DIAGNOSIS — E440 Moderate protein-calorie malnutrition: Secondary | ICD-10-CM | POA: Insufficient documentation

## 2015-05-30 DIAGNOSIS — Z01810 Encounter for preprocedural cardiovascular examination: Secondary | ICD-10-CM

## 2015-05-30 LAB — CBC WITH DIFFERENTIAL/PLATELET
Basophils Absolute: 0 10*3/uL (ref 0.0–0.1)
Basophils Relative: 0 % (ref 0–1)
EOS ABS: 0.6 10*3/uL (ref 0.0–0.7)
Eosinophils Relative: 5 % (ref 0–5)
HEMATOCRIT: 23.4 % — AB (ref 36.0–46.0)
HEMOGLOBIN: 7.6 g/dL — AB (ref 12.0–15.0)
LYMPHS ABS: 3 10*3/uL (ref 0.7–4.0)
Lymphocytes Relative: 25 % (ref 12–46)
MCH: 25.8 pg — AB (ref 26.0–34.0)
MCHC: 32.5 g/dL (ref 30.0–36.0)
MCV: 79.3 fL (ref 78.0–100.0)
MONOS PCT: 5 % (ref 3–12)
Monocytes Absolute: 0.5 10*3/uL (ref 0.1–1.0)
NEUTROS ABS: 7.7 10*3/uL (ref 1.7–7.7)
NEUTROS PCT: 65 % (ref 43–77)
Platelets: 272 10*3/uL (ref 150–400)
RBC: 2.95 MIL/uL — ABNORMAL LOW (ref 3.87–5.11)
RDW: 15.7 % — ABNORMAL HIGH (ref 11.5–15.5)
WBC: 11.9 10*3/uL — ABNORMAL HIGH (ref 4.0–10.5)

## 2015-05-30 LAB — HEMOGLOBIN A1C
Hgb A1c MFr Bld: 6 % — ABNORMAL HIGH (ref 4.8–5.6)
Mean Plasma Glucose: 126 mg/dL

## 2015-05-30 LAB — CBC
HEMATOCRIT: 20.6 % — AB (ref 36.0–46.0)
Hemoglobin: 6.7 g/dL — CL (ref 12.0–15.0)
MCH: 25.9 pg — AB (ref 26.0–34.0)
MCHC: 32.5 g/dL (ref 30.0–36.0)
MCV: 79.5 fL (ref 78.0–100.0)
PLATELETS: 267 10*3/uL (ref 150–400)
RBC: 2.59 MIL/uL — AB (ref 3.87–5.11)
RDW: 15.7 % — ABNORMAL HIGH (ref 11.5–15.5)
WBC: 10.4 10*3/uL (ref 4.0–10.5)

## 2015-05-30 LAB — RENAL FUNCTION PANEL
Albumin: 3.1 g/dL — ABNORMAL LOW (ref 3.5–5.0)
Anion gap: 18 — ABNORMAL HIGH (ref 5–15)
BUN: 123 mg/dL — ABNORMAL HIGH (ref 6–20)
CHLORIDE: 115 mmol/L — AB (ref 101–111)
CO2: 11 mmol/L — ABNORMAL LOW (ref 22–32)
Calcium: 6.1 mg/dL — CL (ref 8.9–10.3)
Creatinine, Ser: 14.88 mg/dL — ABNORMAL HIGH (ref 0.44–1.00)
GFR, EST AFRICAN AMERICAN: 3 mL/min — AB (ref >60)
GFR, EST NON AFRICAN AMERICAN: 2 mL/min — AB (ref >60)
Glucose, Bld: 98 mg/dL (ref 65–99)
POTASSIUM: 3.7 mmol/L (ref 3.5–5.1)
Phosphorus: 10.9 mg/dL — ABNORMAL HIGH (ref 2.5–4.6)
Sodium: 144 mmol/L (ref 135–145)

## 2015-05-30 LAB — TYPE AND SCREEN
ABO/RH(D): A POS
ANTIBODY SCREEN: NEGATIVE
UNIT DIVISION: 0

## 2015-05-30 LAB — URINE CULTURE

## 2015-05-30 LAB — GLUCOSE, CAPILLARY
GLUCOSE-CAPILLARY: 78 mg/dL (ref 65–99)
GLUCOSE-CAPILLARY: 93 mg/dL (ref 65–99)
Glucose-Capillary: 94 mg/dL (ref 65–99)
Glucose-Capillary: 126 mg/dL — ABNORMAL HIGH (ref 65–99)

## 2015-05-30 LAB — PROTIME-INR
INR: 1.15 (ref 0.00–1.49)
PROTHROMBIN TIME: 14.9 s (ref 11.6–15.2)

## 2015-05-30 LAB — PARATHYROID HORMONE, INTACT (NO CA): PTH: 640 pg/mL — ABNORMAL HIGH (ref 15–65)

## 2015-05-30 LAB — SAVE SMEAR

## 2015-05-30 MED ORDER — HEPARIN SODIUM (PORCINE) 1000 UNIT/ML IJ SOLN
INTRAMUSCULAR | Status: AC
  Filled 2015-05-30: qty 1

## 2015-05-30 MED ORDER — FENTANYL CITRATE (PF) 100 MCG/2ML IJ SOLN
INTRAMUSCULAR | Status: AC | PRN
  Administered 2015-05-30: 50 ug via INTRAVENOUS

## 2015-05-30 MED ORDER — RENA-VITE PO TABS
1.0000 | ORAL_TABLET | Freq: Every day | ORAL | Status: DC
  Administered 2015-05-30 – 2015-06-04 (×6): 1 via ORAL
  Filled 2015-05-30 (×6): qty 1

## 2015-05-30 MED ORDER — FENTANYL CITRATE (PF) 100 MCG/2ML IJ SOLN
INTRAMUSCULAR | Status: AC
  Filled 2015-05-30: qty 2

## 2015-05-30 MED ORDER — MIDAZOLAM HCL 2 MG/2ML IJ SOLN
INTRAMUSCULAR | Status: AC
  Filled 2015-05-30: qty 2

## 2015-05-30 MED ORDER — METOCLOPRAMIDE HCL 5 MG/ML IJ SOLN
5.0000 mg | Freq: Once | INTRAMUSCULAR | Status: AC
  Administered 2015-05-30: 5 mg via INTRAVENOUS
  Filled 2015-05-30: qty 2

## 2015-05-30 MED ORDER — MIDAZOLAM HCL 2 MG/2ML IJ SOLN
INTRAMUSCULAR | Status: AC | PRN
  Administered 2015-05-30: 1 mg via INTRAVENOUS

## 2015-05-30 MED ORDER — NEPRO/CARBSTEADY PO LIQD
237.0000 mL | Freq: Two times a day (BID) | ORAL | Status: DC
  Administered 2015-05-31 – 2015-06-05 (×8): 237 mL via ORAL

## 2015-05-30 MED ORDER — LIDOCAINE HCL 1 % IJ SOLN
INTRAMUSCULAR | Status: AC
  Filled 2015-05-30: qty 20

## 2015-05-30 MED ORDER — CEFAZOLIN SODIUM-DEXTROSE 2-3 GM-% IV SOLR
INTRAVENOUS | Status: AC
  Filled 2015-05-30: qty 50

## 2015-05-30 NOTE — Progress Notes (Signed)
CRITICAL VALUE ALERT  Critical value received:  Calcium = 6.1  Date of notification:  05-30-2015  Time of notification:  09:15  Critical value read back:Yes.    Nurse who received alert:  Meridee Score, RN  MD notified (1st page):  Cena Benton  Time of first page:  09:32  MD notified (2nd page):  Time of second page:  Responding MD:  Cena Benton  Time MD responded:  10:23

## 2015-05-30 NOTE — H&P (Signed)
Chief Complaint: Patient was seen in consultation today for end stage renal disease Chief Complaint  Patient presents with  . Dizziness   at the request of Dr. Lowell Guitar  Referring Physician(s): Dr. Lowell Guitar  History of Present Illness: [facility] is a 59 y.o. female who presented 8/20 with dizziness, nausea and constipation. Nephrology has seen the patient and her CKD has progressed to ESRD. IR received request for perm catheter placement. She denies any chest pain, shortness of breath or palpitations. She does admit to slight LE swelling. She denies any active signs of bleeding or excessive bruising. She denies any recent fever or chills. The patient denies any history of sleep apnea or chronic oxygen use. She has no known complications to sedation.    Past Medical History  Diagnosis Date  . Bipolar 1 disorder   . Hypertension   . Diabetes mellitus without complication     Past Surgical History  Procedure Laterality Date  . Tubal ligation      Allergies: Iodine  Medications: Prior to Admission medications   Medication Sig Start Date End Date Taking? Authorizing Provider  amLODipine (NORVASC) 5 MG tablet Take 1 tablet (5 mg total) by mouth daily. 04/16/13   Tora Kindred York, PA-C  insulin NPH-regular (NOVOLIN 70/30) (70-30) 100 UNIT/ML injection Inject 15 Units into the skin 2 (two) times daily with a meal. 04/16/13   Tora Kindred York, PA-C  metoprolol tartrate (LOPRESSOR) 25 MG tablet Take 1 tablet (25 mg total) by mouth 2 (two) times daily. 04/16/13   Tora Kindred York, PA-C  nicotine (NICODERM CQ - DOSED IN MG/24 HOURS) 21 mg/24hr patch Place 1 patch onto the skin daily. 04/16/13   Stephani Police, PA-C     Family History  Problem Relation Age of Onset  . Colon cancer      Neg history  . Kidney disease Cousin     Social History   Social History  . Marital Status: Widowed    Spouse Name: N/A  . Number of Children: N/A  . Years of Education: N/A   Social  History Main Topics  . Smoking status: Current Every Day Smoker -- 1.00 packs/day    Types: Cigarettes  . Smokeless tobacco: None  . Alcohol Use: No     Comment: no  . Drug Use: No  . Sexual Activity: Not Asked   Other Topics Concern  . None   Social History Narrative    Review of Systems: A 12 point ROS discussed and pertinent positives are indicated in the HPI above.  All other systems are negative.  Review of Systems  Vital Signs: BP 165/76 mmHg  Pulse 75  Temp(Src) 97.9 F (36.6 C) (Oral)  Resp 17  Ht 5\' 5"  (1.651 m)  Wt 118 lb (53.524 kg)  BMI 19.64 kg/m2  SpO2 93%  Physical Exam  Constitutional: She is oriented to person, place, and time. No distress.  HENT:  Head: Normocephalic and atraumatic.  Neck: No tracheal deviation present.  Cardiovascular: Normal rate and regular rhythm.  Exam reveals no gallop and no friction rub.   No murmur heard. Pulmonary/Chest: Effort normal and breath sounds normal. No respiratory distress. She has no wheezes. She has no rales.  Abdominal: Soft. Bowel sounds are normal. She exhibits no distension. There is no tenderness.  Neurological: She is alert and oriented to person, place, and time.  Skin: Skin is warm and dry. She is not diaphoretic.    Mallampati Score:  MD Evaluation Airway: WNL Heart: WNL Abdomen: WNL Chest/ Lungs: WNL ASA  Classification: 3 Mallampati/Airway Score: Two  Imaging: X-ray Chest Pa And Lateral  05/28/2015   CLINICAL DATA:  Two weeks of fatigue and lightheadedness. Acute and chronic renal failure.  EXAM: CHEST  2 VIEW  COMPARISON:  04/13/2013  FINDINGS: Borderline cardiomegaly is stable. Negative aortic and hilar contours. There is no edema, consolidation, effusion, or pneumothorax. Left nipple shadow noted.  IMPRESSION: 1. No acute finding or change from 2014. 2. Borderline cardiomegaly.   Electronically Signed   By: Marnee Spring M.D.   On: 05/28/2015 21:55   Abd 1 View (kub)  05/28/2015    CLINICAL DATA:  Acute on chronic renal failure.  Constipation.  EXAM: ABDOMEN - 1 VIEW  COMPARISON:  None.  FINDINGS: There is oral contrast from the stomach to distal colon, presumably in anticipation of CT. The bowel gas pattern is nonobstructive. No concerning intra-abdominal mass effect or calcification. Lung bases are clear. No acute osseous findings.  IMPRESSION: Negative.   Electronically Signed   By: Marnee Spring M.D.   On: 05/28/2015 21:54   US Renal  05/29/2015   CLINICAL DATA:  Acute on chronic renal failure.  Initial encounter.  EXAM: RENAL / URINARY TRACT ULTRASOUND COMPLETE  COMPARISON:  Abdominal radiograph performed earlier today at 9:29 p.m.  FINDINGS: Right Kidney:  Length: 11.0 cm. Diffusely increased parenchymal echogenicity noted. No mass or hydronephrosis visualized.  Left Kidney:  Length: 9.4 cm. Diffusely increased parenchymal echogenicity noted. No mass or hydronephrosis visualized.  Bladder:  Largely decompressed and not well assessed.  IMPRESSION: 1. No evidence of hydronephrosis. 2. Diffusely increased renal parenchymal echogenicity likely reflects medical renal disease.   Electronically Signed   By: Roanna Raider M.D.   On: 05/29/2015 03:38    Labs:  CBC:  Recent Labs  05/28/15 1526 05/28/15 1642 05/29/15 0545 05/29/15 1617 05/30/15 0512  WBC 9.2  --  10.4  --  11.9*  HGB 6.8* 7.8* 6.7* 7.4* 7.6*  HCT 20.6* 23.0* 20.6* 22.2* 23.4*  PLT 286  --  267  --  272    COAGS:  Recent Labs  05/30/15 0946  INR 1.15    BMP:  Recent Labs  05/28/15 1526 05/28/15 1642 05/29/15 0545 05/30/15 0819  NA 142 142 142 144  K 2.9* 2.8* 3.4* 3.7  CL 105 109 108 115*  CO2 12*  --  11* 11*  GLUCOSE 213* 224* 145* 98  BUN 136* 137* 134* 123*  CALCIUM 6.5*  --  6.3* 6.1*  CREATININE 16.97* >18.00* 16.72* 14.88*  GFRNONAA 2*  --  2* 2*  GFRAA 2*  --  2* 3*    LIVER FUNCTION TESTS:  Recent Labs  05/28/15 2203 05/29/15 0545 05/30/15 0819  BILITOT 0.5 0.7   --   AST 9* 10*  --   ALT 9* 9*  --   ALKPHOS 63 59  --   PROT 6.8 6.1*  --   ALBUMIN 3.6 3.5 3.1*    Assessment and Plan: DM type 2, poorly controlled HTN Bipolar disorder ESRD Nephrology requesting perm catheter placement with sedation The patient has been NPO, no blood thinners taken, labs and vitals have been reviewed. Risks and Benefits discussed with the patient including, but not limited to bleeding, infection, vascular injury, pneumothorax which may require chest tube placement, air embolism or even death All of the patient's questions were answered, patient is agreeable to proceed. Consent signed and in  chart. Anemia of chronic disease    Thank you for this interesting consult.  I greatly enjoyed meeting ASUCENA GALER and look forward to participating in their care.  A copy of this report was sent to the requesting provider on this date.  SignedBerneta Levins 05/30/2015, 1:16 PM   I spent a total of 20 Minutes in face to face in clinical consultation, greater than 50% of which was counseling/coordinating care for end stage renal disease.

## 2015-05-30 NOTE — Progress Notes (Signed)
Subjective: She reports being currently this morning and understands that she'll need to have her catheter placed before she can eat.  Objective: Vital signs in last 24 hours: Temp:  [97.8 F (36.6 C)-98.4 F (36.9 C)] 97.9 F (36.6 C) (08/22 0725) Pulse Rate:  [75-86] 75 (08/22 0725) Resp:  [16-18] 17 (08/22 0725) BP: (149-171)/(76-93) 165/76 mmHg (08/22 0725) SpO2:  [93 %-100 %] 93 % (08/22 0725) Weight:  [118 lb (53.524 kg)] 118 lb (53.524 kg) (08/21 2052) Weight change: 11 lb (4.99 kg)  Intake/Output from previous day: 08/21 0701 - 08/22 0700 In: 4331 [P.O.:960; I.V.:3090; Blood:281] Out: 675 [Urine:275; Emesis/NG output:100; Stool:300] Intake/Output this shift:    General: Middle-aged African-American female, resting in bed, NAD Cardiac: RRR, no rubs, murmurs or gallops Pulm: clear to auscultation bilaterally in anterior lung fields, no wheezes, rales, or rhonchi Abd: soft, nontender, nondistended, BS present Ext: warm and well perfused, no pedal edema Neuro: moving all extremities freely   Lab Results:  Recent Labs  05/29/15 0545 05/29/15 1617 05/30/15 0512  WBC 10.4  --  11.9*  HGB 6.7* 7.4* 7.6*  HCT 20.6* 22.2* 23.4*  PLT 267  --  272   BMET:  Recent Labs  05/29/15 0545 05/30/15 0819  NA 142 144  K 3.4* 3.7  CL 108 115*  CO2 11* 11*  GLUCOSE 145* 98  BUN 134* 123*  CREATININE 16.72* 14.88*  CALCIUM 6.3* 6.1*   Iron Studies:  Recent Labs  05/28/15 2203  IRON 144  TIBC 172*  FERRITIN 440*   Studies/Results: X-ray Chest Pa And Lateral  05/28/2015   CLINICAL DATA:  Two weeks of fatigue and lightheadedness. Acute and chronic renal failure.  EXAM: CHEST  2 VIEW  COMPARISON:  04/13/2013  FINDINGS: Borderline cardiomegaly is stable. Negative aortic and hilar contours. There is no edema, consolidation, effusion, or pneumothorax. Left nipple shadow noted.  IMPRESSION: 1. No acute finding or change from 2014. 2. Borderline cardiomegaly.    Electronically Signed   By: Marnee Spring M.D.   On: 05/28/2015 21:55   Abd 1 View (kub)  05/28/2015   CLINICAL DATA:  Acute on chronic renal failure.  Constipation.  EXAM: ABDOMEN - 1 VIEW  COMPARISON:  None.  FINDINGS: There is oral contrast from the stomach to distal colon, presumably in anticipation of CT. The bowel gas pattern is nonobstructive. No concerning intra-abdominal mass effect or calcification. Lung bases are clear. No acute osseous findings.  IMPRESSION: Negative.   Electronically Signed   By: Marnee Spring M.D.   On: 05/28/2015 21:54   US Renal  05/29/2015   CLINICAL DATA:  Acute on chronic renal failure.  Initial encounter.  EXAM: RENAL / URINARY TRACT ULTRASOUND COMPLETE  COMPARISON:  Abdominal radiograph performed earlier today at 9:29 p.m.  FINDINGS: Right Kidney:  Length: 11.0 cm. Diffusely increased parenchymal echogenicity noted. No mass or hydronephrosis visualized.  Left Kidney:  Length: 9.4 cm. Diffusely increased parenchymal echogenicity noted. No mass or hydronephrosis visualized.  Bladder:  Largely decompressed and not well assessed.  IMPRESSION: 1. No evidence of hydronephrosis. 2. Diffusely increased renal parenchymal echogenicity likely reflects medical renal disease.   Electronically Signed   By: Roanna Raider M.D.   On: 05/29/2015 03:38   Scheduled: . amLODipine  5 mg Oral Daily  . calcitRIOL  0.25 mcg Oral Daily  . calcium acetate  1,334 mg Oral TID WC  . docusate sodium  100 mg Oral BID  . feeding supplement (ENSURE ENLIVE)  237 mL Oral BID BM  . heparin  5,000 Units Subcutaneous 3 times per day  . insulin aspart  0-5 Units Subcutaneous QHS  . insulin aspart  0-9 Units Subcutaneous TID WC  . multivitamin  1 tablet Oral QHS  . nicotine  21 mg Transdermal Daily  . sodium chloride  3 mL Intravenous Q12H    Assessment/Plan: #1 ESRD: Likely in the setting of poorly controlled hypertension and diabetes. Elevated vitamin B12 levels can be seen with ESRD given  impaired clearance of binding protein in serum. Peripheral smear reviewed with Dr. Cyndie Chime [hematology] and notable for very RBC morphologies, including burr cells consistent with her level of uremia and occasional lymphocytes, but no polychromasia. Differential also reassuring for no myeloproliferative disorder. -Plan for catheter placement by IR today -Consult vascular surgery today  -Started multivitamin today  #2 anemia of chronic disease: Consistent with elevated ferritin, normal iron on admission likely the setting of ESRD. Hemoglobin 8, baseline 10-11 from 2 years ago at her most recent hospitalization. Received PRBC 1.  #3 metabolic bone disease: PTH, vitamin D pending. Continue PhosLo  three times daily, calcitriol 0.25 mcg.   #4 hypertension: Systolic BP 170 to 190s.on amlodipine  for now. Reassess after HD.  #5 type 2 diabetes  #6 type I bipolar disorder   LOS: 2 days   Allena Katz, [place] 05/30/2015,1:25 PM  I have seen and examined this patient and agree with plan as outlined by Dr. Allena Katz.  Pt with progressive CKD due to poorly controlled DM and HTN.  Now ESRD and will require vascular access and initiation of RRT.  We did discuss the need to follow up with a PCP and will need better BP and diabetes control as well as smoking cessation in order to be considered for a renal transplant.  She still seems to be in denial regarding the chronicity and severity of her CKD and I stressed the importance of better medical follow up (as she did not see a physician after her hospitalization in 2014 nor did she continue with her diabetes and blood pressure medications for the last 2 years). Taber Sweetser A,MD 05/30/2015 1:52 PM

## 2015-05-30 NOTE — Progress Notes (Signed)
Initial Nutrition Assessment  DOCUMENTATION CODES:   Non-severe (moderate) malnutrition in context of chronic illness  INTERVENTION:   Once diet advances, provide Nepro Shake po BID, each supplement provides 425 kcal and 19 grams protein.  NUTRITION DIAGNOSIS:   Malnutrition related to chronic illness as evidenced by percent weight loss, moderate depletions of muscle mass, moderate depletion of body fat.  GOAL:   Patient will meet greater than or equal to 90% of their needs  MONITOR:   Diet advancement, Weight trends, Labs, I & O's  REASON FOR ASSESSMENT:   Malnutrition Screening Tool    ASSESSMENT:   59 year old female with poorly controlled DM2, HTN, bipolar disorder type 1, tobacco abuse who presented to the ED overnight with her son with several weeks of decreased energy, fatigue, lightheadedness. New dx of ESRD underdoing vein mapping 8/22.  Pt reports currently being hungry. Pt NPO for catheter placement. Pt reports PTA having she has been usually consuming at least 2 meals daily, however had been having n/v and unable to keep food down. Pt reports having weight loss with usual body weight ~140 lbs which she reports last weighing 2 months ago. Pt with a 16.3% weight loss in 2 months. Pt has Ensure ordered, however phosphorous is elevated. Pt is agreeable to switching Ensure to Nepro. RD to modify orders.   Nutrition-Focused physical exam completed. Findings are moderate fat depletion, moderate muscle depletion, and mild edema.   Phosphorous elevated (10.9). Potassium WNL.   Diet Order:  Diet NPO time specified Except for: Sips with Meds  Skin:   (non-pittiing LE edema)  Last BM:  8/21  Height:   Ht Readings from Last 1 Encounters:  05/28/15  (1.651 m)    Weight:   Wt Readings from Last 1 Encounters:  05/29/15 118 lb (53.524 kg)    Ideal Body Weight:  56.8 kg  BMI:  Body mass index is 19.64 kg/(m^2).  Estimated Nutritional Needs:   Kcal:   1700-1900  Protein:  70-90 grams  Fluid:  Per MD  EDUCATION NEEDS:   No education needs identified at this time  Roslyn Smiling, MS, RD, LDN Pager # [phone] After hours/ weekend pager # [phone]

## 2015-05-30 NOTE — Progress Notes (Signed)
VASCULAR LAB PRELIMINARY  PRELIMINARY  PRELIMINARY  PRELIMINARY   Right  Upper Extremity Vein Map    Cephalic  Segment Diameter Depth Comment  1. Axilla 2.56mm mm   2. Mid upper arm 2.15mm mm   3. Above AC mm mm Not imaged due to IV  4. In AC 3.29mm mm   5. Below AC 1.42mm mm   6. Mid forearm 1.49mm mm   7. Wrist mm mm Too small   mm mm    mm mm    mm mm    Basilic  Segment Diameter Depth Comment  1. Axilla mm mm   2. Mid upper arm mm mm   3. Above AC mm mm Too small  4. In [facility] 1.36mm 11.13mm   5. Below AC 1.30mm 9.56mm   6. Mid forearm mm mm Too small  7. Wrist mm mm    mm mm    mm mm    mm mm       Left Upper Extremity Vein Map    Cephalic  Segment Diameter Depth Comment  1. Axilla 1.68mm mm   2. Mid upper arm 1.16mm mm   3. Above AC 1.39mm mm   4. In [facility] 2.50mm mm   5. Below AC mm mm Too small  6. Mid forearm mm mm   7. Wrist mm mm    mm mm    mm mm    mm mm    Basilic  Segment Diameter Depth Comment  1. Axilla mm mm   2. Mid upper arm 1.62mm 9.2mm   3. Above AC 1.48mm mm thrombus  4. In AC 2.31mm mm thrombus  5. Below AC mm mm   6. Mid forearm mm mm   7. Wrist mm mm    mm mm    mm mm    mm mm     Farrel Demark, RDMS, RVT  05/30/2015, 8:44 AM

## 2015-05-30 NOTE — Consult Note (Signed)
[facility] NOTE   MRN : [id number]  Reason for Consult: ESRD Referring Physician: Dr. Allena Katz  History of Present Illness: 59 y/o female with CKD admitted with weakness, fatigue and constipation.  Her Cr was 14.0.  Radiology placed a tunnel catheter today.  Past medical history includes:Bipolar disorder, HTN managed with Metoprolol and Norvasc, DM2 who has not been seen by a doctor in quite a while and has not been on medications for at least 1 year.   She is not on any anticoagulation medications.She states she makes her own decisions and signs her own consents for procedures.  She was very drowsy today and has just return from IR procedure.       Current Facility-Administered Medications  Medication Dose Route Frequency Provider Last Rate Last Dose  . 0.9 % NaCl with KCl 20 mEq/ L  infusion   Intravenous Continuous Inez Catalina, MD 100 mL/hr at 05/30/15 0559    . acetaminophen (TYLENOL) tablet 650 mg  650 mg Oral Q6H PRN Inez Catalina, MD       Or  . acetaminophen (TYLENOL) suppository 650 mg  650 mg Rectal Q6H PRN Inez Catalina, MD      . alum & mag hydroxide-simeth (MAALOX/MYLANTA) 200-200-20 MG/5ML suspension 30 mL  30 mL Oral Q6H PRN Inez Catalina, MD      . amLODipine (NORVASC) tablet 5 mg  5 mg Oral Daily Inez Catalina, MD   5 mg at 05/29/15 0948  . calcitRIOL (ROCALTROL) capsule 0.25 mcg  0.25 mcg Oral Daily Inez Catalina, MD   0.25 mcg at 05/29/15 0948  . calcium acetate (PHOSLO) capsule 1,334 mg  1,334 mg Oral TID WC Inez Catalina, MD   1,334 mg at 05/29/15 1747  . ceFAZolin (ANCEF) 2-3 GM-% IVPB SOLR           . docusate sodium (COLACE) capsule 100 mg  100 mg Oral BID Inez Catalina, MD   100 mg at 05/30/15 1003  . [START ON 05/31/2015] feeding supplement (NEPRO CARB STEADY) liquid 237 mL  237 mL Oral BID BM Marinell Blight, RD      . fentaNYL (SUBLIMAZE) 100 MCG/2ML injection           . heparin 1000 UNIT/ML injection           .  heparin injection 5,000 Units  5,000 Units Subcutaneous 3 times per day Berneta Levins, PA-C   Stopped at 05/30/15 1400  . hydrALAZINE (APRESOLINE) injection 10 mg  10 mg Intravenous Q6H PRN Penny Pia, MD      . insulin aspart (novoLOG) injection 0-5 Units  0-5 Units Subcutaneous QHS Inez Catalina, MD   0 Units at 05/28/15 2247  . insulin aspart (novoLOG) injection 0-9 Units  0-9 Units Subcutaneous TID WC Inez Catalina, MD   1 Units at 05/29/15 1747  . lidocaine (XYLOCAINE) 1 % (with pres) injection           . midazolam (VERSED) 2 MG/2ML injection           . multivitamin (RENA-VIT) tablet 1 tablet  1 tablet Oral QHS Terrial Rhodes, MD      . nicotine (NICODERM CQ - dosed in mg/24 hours) patch 21 mg  21 mg Transdermal Daily Inez Catalina, MD   21 mg at 05/30/15 1004  . ondansetron (ZOFRAN) tablet 4 mg  4 mg Oral Q6H PRN Inez Catalina,  MD       Or  . ondansetron (ZOFRAN) injection 4 mg  4 mg Intravenous Q6H PRN Inez Catalina, MD   4 mg at 05/29/15 0949  . senna-docusate (Senokot-S) tablet 1 tablet  1 tablet Oral QHS PRN Inez Catalina, MD      . sodium chloride 0.9 % injection 3 mL  3 mL Intravenous Q12H Inez Catalina, MD   3 mL at 05/30/15 1002    Pt meds include: Statin :No Betablocker: Yes ASA: No Other anticoagulants/antiplatelets: none  Past Medical History  Diagnosis Date  . Bipolar 1 disorder   . Hypertension   . Diabetes mellitus without complication     Past Surgical History  Procedure Laterality Date  . Tubal ligation      Social History Social History  Substance Use Topics  . Smoking status: Current Every Day Smoker -- 1.00 packs/day    Types: Cigarettes  . Smokeless tobacco: None  . Alcohol Use: No     Comment: no    Family History Family History  Problem Relation Age of Onset  . Colon cancer      Neg history  . Kidney disease Cousin     Allergies  Allergen Reactions  . Iodine Other (See Comments)    unknown     REVIEW OF  SYSTEMS  General: [ ]  Weight loss, [ ]  Fever, [ ]  chills Neurologic: [ ]  Dizziness, [ ]  Blackouts, [ ]  Seizure [ ]  Stroke, [ ]  "Mini stroke", [ ]  Slurred speech, [ ]  Temporary blindness; [ ]  weakness in arms or legs, [ ]  Hoarseness [ ]  Dysphagia Cardiac: [ ]  Chest pain/pressure, [ ]  Shortness of breath at rest [ ]  Shortness of breath with exertion, [ ]  Atrial fibrillation or irregular heartbeat  Vascular: [ ]  Pain in legs with walking, [ ]  Pain in legs at rest, [ ]  Pain in legs at night,  [ ]  Non-healing ulcer, [ ]  Blood clot in vein/DVT,   Pulmonary: [ ]  Home oxygen, [ ]  Productive cough, [ ]  Coughing up blood, [ ]  Asthma,  [ ]  Wheezing [ ]  COPD Musculoskeletal:  [ ]  Arthritis, [ ]  Low back pain, [ ]  Joint pain Hematologic: [ ]  Easy Bruising, [ ]  Anemia; [ ]  Hepatitis Gastrointestinal: [ ]  Blood in stool, [ ]  Gastroesophageal Reflux/heartburn, Urinary: [x ] chronic Kidney disease, [ ]  on HD - [ ]  MWF or [ ]  TTHS, [ ]  Burning with urination, [ ]  Difficulty urinating Skin: [ ]  Rashes, [ ]  Wounds Psychological: [ ]  Anxiety, [ ]  Depression [x]  Bipolar  Physical Examination Filed Vitals:   05/30/15 1500 05/30/15 1507 05/30/15 1513 05/30/15 1517  BP: 185/78 175/80 117/74 145/77  Pulse: 71 66 79 80  Temp:      TempSrc:      Resp: 17 20 12 13   Height:      Weight:      SpO2: 100% 100% 95% 99%   Body mass index is 19.64 kg/(m^2).  General:  WDWN in NAD, drowsy HENT: WNL Eyes: Pupils equal Pulmonary: normal non-labored breathing , without Rales, rhonchi,  wheezing Cardiac: RRR, without  Murmurs, rubs or gallops; No carotid bruits Abdomen: soft, NT, no masses Skin: no rashes, ulcers noted;  no Gangrene , no cellulitis; no open wounds;   Vascular Exam/Pulses:Palpable radial pulses bilateral 3+   Musculoskeletal: no muscle wasting or atrophy; no edema  Neurologic: A&O X 3; Appropriate Affect ;  SENSATION: normal; MOTOR FUNCTION: 5/5  Symmetric Speech is  fluent/normal   Significant Diagnostic Studies: CBC Lab Results  Component Value Date   WBC 11.9* 05/30/2015   HGB 7.6* 05/30/2015   HCT 23.4* 05/30/2015   MCV 79.3 05/30/2015   PLT 272 05/30/2015    BMET    Component Value Date/Time   NA 144 05/30/2015 0819   K 3.7 05/30/2015 0819   CL 115* 05/30/2015 0819   CO2 11* 05/30/2015 0819   GLUCOSE 98 05/30/2015 0819   BUN 123* 05/30/2015 0819   CREATININE 14.88* 05/30/2015 0819   CREATININE 1.02 06/28/2007 1510   CALCIUM 6.1* 05/30/2015 0819   GFRNONAA 2* 05/30/2015 0819   GFRAA 3* 05/30/2015 0819   Estimated Creatinine Clearance: 3.4 mL/min (by C-G formula based on Cr of 14.88).  COAG Lab Results  Component Value Date   INR 1.15 05/30/2015   INR 0.99 04/12/2013   INR 0.97 04/12/2013     Non-Invasive Vascular Imaging:  Right Cephalic  Segment Diameter Depth Comment  1. Axilla 2.37mm mm   2. Mid upper arm 2.32mm mm   3. Above AC mm mm Not imaged due to IV  4. In AC 3.58mm mm   5. Below AC 1.3mm mm   6. Mid forearm 1.48mm mm   7. Wrist mm mm Too small   mm mm    mm mm    mm mm        Thrombus in the left Basilic, small left cephalic vein  ASSESSMENT/PLAN:  CKD, now ESRD with Cr 14.88 A tunnel dialysis catheter was place by IR today.  She will need a right AV fistula verses great place.   An IV is on the right antecubital area that will need to be moved prior to the procedure.  I will place a nursed care order for the IV to be moved.   Clinton Gallant [facility] 05/30/2015 3:38 PM  I have examined the patient, reviewed and agree with above. Patient is right handed. Suspect she is going to be difficult the access patient. Her brachial arteries feel normal bilaterally. She does have palpable radial pulse but the arteries are extremely calcified and quite small. Her only potential fistula site would be her right upper arm cephalic vein. Forced me she has a IV and this  vein currently. We will follow along as she presses on hemodialysis. We'll plan the eventual access for long-term dialysis  Gretta Began, MD 05/30/2015 4:44 PM

## 2015-05-30 NOTE — Progress Notes (Signed)
[facility] PROGRESS NOTE  Jane Higgins WJX:[id number] DOB: 1955-12-27 DOA: 05/28/2015 PCP: No primary care provider on file.  Assessment/Plan: Principal Problem:   ARF (acute renal failure) - Nephrology consulted  - Pt currently undergoing work up - Renal ultrasound reports no evidence of hydronephrosis - Pt to undergo dialysis  Active Problems:   Hypertensive urgency - resolving. Patient is currently on amlodipine - Hydralazine on board prn    Hypokalemia - resolved on last check.    DM2 (diabetes mellitus, type 2) - Currently on SSI    Anemia - Most likely due to Renal failure - transfuse 1 unit given symptomatic anemia   Code Status: full Family Communication: no family at bedside.  Disposition Plan: Pending improvement in condition.   Consultants:  Nephrology  Procedures:  None  Antibiotics:  None  HPI/Subjective: Pt has no new complaints. No acute issues overnight.  Objective: Filed Vitals:   05/30/15 0725  BP: 165/76  Pulse: 75  Temp: 97.9 F (36.6 C)  Resp: 17    Intake/Output Summary (Last 24 hours) at 05/30/15 1212 Last data filed at 05/30/15 2130  Gross per 24 hour  Intake   3971 ml  Output     25 ml  Net   3946 ml   Filed Weights   05/28/15 1445 05/28/15 2102 05/29/15 2052  Weight: 48.535 kg (107 lb) 51.03 kg (112 lb 8 oz) 53.524 kg (118 lb)    Exam:   General:  Pt in nad, alert and awake  Cardiovascular: rrr, no mrg  Respiratory: cta bl, no wheezes  Abdomen: soft, ND, NT  Musculoskeletal: no cyanosis or clubbing   Data Reviewed: Basic Metabolic Panel:  Recent Labs Lab 05/28/15 1526 05/28/15 1642 05/28/15 2203 05/29/15 0545 05/30/15 0819  NA 142 142  --  142 144  K 2.9* 2.8*  --  3.4* 3.7  CL 105 109  --  108 115*  CO2 12*  --   --  11* 11*  GLUCOSE 213* 224*  --  145* 98  BUN 136* 137*  --  134* 123*  CREATININE 16.97* >18.00*  --  16.72* 14.88*  CALCIUM 6.5*  --   --  6.3* 6.1*  MG  --   --   1.8  --   --   PHOS  --   --  13.8*  --  10.9*   Liver Function Tests:  Recent Labs Lab 05/28/15 2203 05/29/15 0545 05/30/15 0819  AST 9* 10*  --   ALT 9* 9*  --   ALKPHOS 63 59  --   BILITOT 0.5 0.7  --   PROT 6.8 6.1*  --   ALBUMIN 3.6 3.5 3.1*   No results for input(s): LIPASE, AMYLASE in the last 168 hours. No results for input(s): AMMONIA in the last 168 hours. CBC:  Recent Labs Lab 05/28/15 1526 05/28/15 1642 05/29/15 0545 05/29/15 1617 05/30/15 0512  WBC 9.2  --  10.4  --  11.9*  NEUTROABS  --   --   --   --  7.7  HGB 6.8* 7.8* 6.7* 7.4* 7.6*  HCT 20.6* 23.0* 20.6* 22.2* 23.4*  MCV 78.9  --  79.5  --  79.3  PLT 286  --  267  --  272   Cardiac Enzymes:  Recent Labs Lab 05/28/15 2203  CKTOTAL 330*   BNP (last 3 results) No results for input(s): BNP in the last 8760 hours.  ProBNP (last 3 results) No results for  input(s): PROBNP in the last 8760 hours.  CBG:  Recent Labs Lab 05/29/15 1131 05/29/15 1611 05/29/15 2054 05/30/15 0723 05/30/15 1117  GLUCAP 187* 137* 133* 78 94    Recent Results (from the past 240 hour(s))  Urine culture     Status: None   Collection Time: 05/28/15  6:15 PM  Result Value Ref Range Status   Specimen Description URINE, RANDOM  Final   Special Requests NONE  Final   Culture MULTIPLE SPECIES PRESENT, SUGGEST RECOLLECTION  Final   Report Status 05/30/2015 FINAL  Final  C difficile quick scan w PCR reflex     Status: None   Collection Time: 05/29/15  9:10 AM  Result Value Ref Range Status   C Diff antigen NEGATIVE NEGATIVE Final   C Diff toxin NEGATIVE NEGATIVE Final   C Diff interpretation Negative for toxigenic C. difficile  Final     Studies: X-ray Chest Pa And Lateral  05/28/2015   CLINICAL DATA:  Two weeks of fatigue and lightheadedness. Acute and chronic renal failure.  EXAM: CHEST  2 VIEW  COMPARISON:  04/13/2013  FINDINGS: Borderline cardiomegaly is stable. Negative aortic and hilar contours. There is no  edema, consolidation, effusion, or pneumothorax. Left nipple shadow noted.  IMPRESSION: 1. No acute finding or change from 2014. 2. Borderline cardiomegaly.   Electronically Signed   By: Marnee Spring M.D.   On: 05/28/2015 21:55   Abd 1 View (kub)  05/28/2015   CLINICAL DATA:  Acute on chronic renal failure.  Constipation.  EXAM: ABDOMEN - 1 VIEW  COMPARISON:  None.  FINDINGS: There is oral contrast from the stomach to distal colon, presumably in anticipation of CT. The bowel gas pattern is nonobstructive. No concerning intra-abdominal mass effect or calcification. Lung bases are clear. No acute osseous findings.  IMPRESSION: Negative.   Electronically Signed   By: Marnee Spring M.D.   On: 05/28/2015 21:54   US Renal  05/29/2015   CLINICAL DATA:  Acute on chronic renal failure.  Initial encounter.  EXAM: RENAL / URINARY TRACT ULTRASOUND COMPLETE  COMPARISON:  Abdominal radiograph performed earlier today at 9:29 p.m.  FINDINGS: Right Kidney:  Length: 11.0 cm. Diffusely increased parenchymal echogenicity noted. No mass or hydronephrosis visualized.  Left Kidney:  Length: 9.4 cm. Diffusely increased parenchymal echogenicity noted. No mass or hydronephrosis visualized.  Bladder:  Largely decompressed and not well assessed.  IMPRESSION: 1. No evidence of hydronephrosis. 2. Diffusely increased renal parenchymal echogenicity likely reflects medical renal disease.   Electronically Signed   By: Roanna Raider M.D.   On: 05/29/2015 03:38    Scheduled Meds: . amLODipine  5 mg Oral Daily  . calcitRIOL  0.25 mcg Oral Daily  . calcium acetate  1,334 mg Oral TID WC  . docusate sodium  100 mg Oral BID  . feeding supplement (ENSURE ENLIVE)  237 mL Oral BID BM  . heparin  5,000 Units Subcutaneous 3 times per day  . insulin aspart  0-5 Units Subcutaneous QHS  . insulin aspart  0-9 Units Subcutaneous TID WC  . multivitamin  1 tablet Oral QHS  . nicotine  21 mg Transdermal Daily  . sodium chloride  3 mL  Intravenous Q12H   Continuous Infusions: . 0.9 % NaCl with KCl 20 mEq / L 100 mL/hr at 05/30/15 0559    Time spent: > 35 minutes   Penny Pia  [facility] Pager [phone] If 7PM-7AM, please contact night-coverage at [email or web address], password [facility] 05/30/2015, 12:12  PM  LOS: 2 days

## 2015-05-31 LAB — GLUCOSE, CAPILLARY
GLUCOSE-CAPILLARY: 138 mg/dL — AB (ref 65–99)
GLUCOSE-CAPILLARY: 203 mg/dL — AB (ref 65–99)
Glucose-Capillary: 135 mg/dL — ABNORMAL HIGH (ref 65–99)
Glucose-Capillary: 129 mg/dL — ABNORMAL HIGH (ref 65–99)

## 2015-05-31 LAB — UIFE/LIGHT CHAINS/TP QN, 24-HR UR
% BETA, Urine: 19.4 %
ALPHA 1 URINE: 2.9 %
ALPHA 2 UR: 10.9 %
Albumin, U: 46.5 %
FREE KAPPA/LAMBDA RATIO: 2.5 (ref 2.04–10.37)
Free Lambda Lt Chains,Ur: 94.3 mg/L — ABNORMAL HIGH (ref 0.24–6.66)
Free Lt Chn Excr Rate: 236 mg/L — ABNORMAL HIGH (ref 1.35–24.19)
GAMMA GLOBULIN URINE: 20.2 %
Total Protein, Urine-Ur/day: 55.8 mg/24 hr (ref 30.0–150.0)
Total Protein, Urine: 186.1 mg/dL

## 2015-05-31 LAB — CBC
HEMATOCRIT: 21.5 % — AB (ref 36.0–46.0)
HEMOGLOBIN: 7.1 g/dL — AB (ref 12.0–15.0)
MCH: 26.6 pg (ref 26.0–34.0)
MCHC: 33 g/dL (ref 30.0–36.0)
MCV: 80.5 fL (ref 78.0–100.0)
Platelets: 236 10*3/uL (ref 150–400)
RBC: 2.67 MIL/uL — AB (ref 3.87–5.11)
RDW: 16 % — ABNORMAL HIGH (ref 11.5–15.5)
WBC: 11.1 10*3/uL — ABNORMAL HIGH (ref 4.0–10.5)

## 2015-05-31 LAB — RENAL FUNCTION PANEL
Albumin: 3.2 g/dL — ABNORMAL LOW (ref 3.5–5.0)
Anion gap: 16 — ABNORMAL HIGH (ref 5–15)
BUN: 114 mg/dL — ABNORMAL HIGH (ref 6–20)
CHLORIDE: 115 mmol/L — AB (ref 101–111)
CO2: 10 mmol/L — AB (ref 22–32)
CREATININE: 14.31 mg/dL — AB (ref 0.44–1.00)
Calcium: 6.5 mg/dL — ABNORMAL LOW (ref 8.9–10.3)
GFR, EST AFRICAN AMERICAN: 3 mL/min — AB (ref >60)
GFR, EST NON AFRICAN AMERICAN: 2 mL/min — AB (ref >60)
Glucose, Bld: 157 mg/dL — ABNORMAL HIGH (ref 65–99)
POTASSIUM: 3.7 mmol/L (ref 3.5–5.1)
Phosphorus: 10.6 mg/dL — ABNORMAL HIGH (ref 2.5–4.6)
Sodium: 141 mmol/L (ref 135–145)

## 2015-05-31 MED ORDER — ONDANSETRON HCL 4 MG/2ML IJ SOLN
INTRAMUSCULAR | Status: AC
  Filled 2015-05-31: qty 2

## 2015-05-31 NOTE — Progress Notes (Signed)
Subjective: Refused HD yesterday though willing to have it today.  Objective: Vital signs in last 24 hours: Temp:  [97.6 F (36.4 C)-98 F (36.7 C)] 98 F (36.7 C) (08/23 0811) Pulse Rate:  [63-95] 85 (08/23 0811) Resp:  [12-20] 16 (08/23 0811) BP: (117-193)/(66-80) 152/71 mmHg (08/23 0811) SpO2:  [95 %-100 %] 100 % (08/23 0811) Weight:  [116 lb 14.4 oz (53.024 kg)] 116 lb 14.4 oz (53.024 kg) (08/22 Mar 07, 2023) Weight change: -1 lb 1.7 oz (-0.5 kg)  Intake/Output from previous day: 08/22 0701 - 08/23 0700 In: 2640 [P.O.:240; I.V.:2400] Out: 300 [Urine:300] Intake/Output this shift: Total I/O In: 240 [P.O.:240] Out: 0   General: Middle-aged African-American female, resting in bed, NAD Cardiac: RRR, no rubs, murmurs or gallops Pulm: clear to auscultation bilaterally in anterior lung fields, no wheezes, rales, or rhonchi Abd: soft, nontender, nondistended, BS present Ext: warm and well perfused, no pedal edema Neuro: moving all extremities freely   Lab Results:  Recent Labs  05/29/15 0545 05/29/15 1617 05/30/15 0512  WBC 10.4  --  11.9*  HGB 6.7* 7.4* 7.6*  HCT 20.6* 22.2* 23.4*  PLT 267  --  272   BMET:   Recent Labs  05/30/15 0819 05/31/15 0612  NA 144 141  K 3.7 3.7  CL 115* 115*  CO2 11* 10*  GLUCOSE 98 157*  BUN 123* 114*  CREATININE 14.88* 14.31*  CALCIUM 6.1* 6.5*   Iron Studies:   Recent Labs  05/28/15 03/06/02  IRON 144  TIBC 172*  FERRITIN 440*   Studies/Results: Ir Fluoro Guide Cv Line Right  05/30/2015   CLINICAL DATA:  End-stage renal disease, needs venous access for hemodialysis  EXAM: TUNNELED HEMODIALYSIS CATHETER PLACEMENT WITH ULTRASOUND AND FLUOROSCOPIC GUIDANCE  TECHNIQUE: The procedure, risks, benefits, and alternatives were explained to the patient. Questions regarding the procedure were encouraged and answered. The patient understands and consents to the procedure. As antibiotic prophylaxis, cefazolin 2 g was ordered pre-procedure and  administered intravenously within one hour of incision.Patency of the right IJ vein was confirmed with ultrasound with image documentation. An appropriate skin site was determined. Region was prepped using maximum barrier technique including cap and mask, sterile gown, sterile gloves, large sterile sheet, and Chlorhexidine as cutaneous antisepsis. The region was infiltrated locally with 1% lidocaine.  Intravenous Fentanyl and Versed were administered as conscious sedation during continuous cardiorespiratory monitoring by the radiology RN, with a total moderate sedation time of 15 minutes.  Under real-time ultrasound guidance, the right IJ vein was accessed with a 21 gauge micropuncture needle; the needle tip within the vein was confirmed with ultrasound image documentation. Needle exchanged over the 018 guidewire for transitional dilator, which allowed advancement of a Benson wire into the IVC. Over this, an MPA catheter was advanced. A Palindrome 23 hemodialysis catheter was tunneled from the right anterior chest wall approach to the right IJ dermatotomy site. The MPA catheter was exchanged over an Amplatz wire for serial vascular dilators which allow placement of a peel-away sheath, through which the catheter was advanced under intermittent fluoroscopy, positioned with its tips in the proximal and midright atrium. Spot chest radiograph confirms good catheter position. No pneumothorax. Catheter was flushed and primed per protocol. Catheter secured externally with O Prolene sutures. The right IJ dermatotomy site was closed with Dermabond.  COMPLICATIONS: COMPLICATIONS None immediate  FLUOROSCOPY TIME:  36 seconds ,10.2 mGy  COMPARISON:  None  IMPRESSION: 1. Technically successful placement of tunneled right IJ hemodialysis catheter with ultrasound and  fluoroscopic guidance. Ready for routine use.   Electronically Signed   By: Corlis Leak M.D.   On: 05/30/2015 15:39   Ir US Guide Vasc Access Right  05/30/2015    CLINICAL DATA:  End-stage renal disease, needs venous access for hemodialysis  EXAM: TUNNELED HEMODIALYSIS CATHETER PLACEMENT WITH ULTRASOUND AND FLUOROSCOPIC GUIDANCE  TECHNIQUE: The procedure, risks, benefits, and alternatives were explained to the patient. Questions regarding the procedure were encouraged and answered. The patient understands and consents to the procedure. As antibiotic prophylaxis, cefazolin 2 g was ordered pre-procedure and administered intravenously within one hour of incision.Patency of the right IJ vein was confirmed with ultrasound with image documentation. An appropriate skin site was determined. Region was prepped using maximum barrier technique including cap and mask, sterile gown, sterile gloves, large sterile sheet, and Chlorhexidine as cutaneous antisepsis. The region was infiltrated locally with 1% lidocaine.  Intravenous Fentanyl and Versed were administered as conscious sedation during continuous cardiorespiratory monitoring by the radiology RN, with a total moderate sedation time of 15 minutes.  Under real-time ultrasound guidance, the right IJ vein was accessed with a 21 gauge micropuncture needle; the needle tip within the vein was confirmed with ultrasound image documentation. Needle exchanged over the 018 guidewire for transitional dilator, which allowed advancement of a Benson wire into the IVC. Over this, an MPA catheter was advanced. A Palindrome 23 hemodialysis catheter was tunneled from the right anterior chest wall approach to the right IJ dermatotomy site. The MPA catheter was exchanged over an Amplatz wire for serial vascular dilators which allow placement of a peel-away sheath, through which the catheter was advanced under intermittent fluoroscopy, positioned with its tips in the proximal and midright atrium. Spot chest radiograph confirms good catheter position. No pneumothorax. Catheter was flushed and primed per protocol. Catheter secured externally with O Prolene  sutures. The right IJ dermatotomy site was closed with Dermabond.  COMPLICATIONS: COMPLICATIONS None immediate  FLUOROSCOPY TIME:  36 seconds ,10.2 mGy  COMPARISON:  None  IMPRESSION: 1. Technically successful placement of tunneled right IJ hemodialysis catheter with ultrasound and fluoroscopic guidance. Ready for routine use.   Electronically Signed   By: Corlis Leak M.D.   On: 05/30/2015 15:39   Scheduled: . amLODipine  5 mg Oral Daily  . calcitRIOL  0.25 mcg Oral Daily  . calcium acetate  1,334 mg Oral TID WC  . docusate sodium  100 mg Oral BID  . feeding supplement (NEPRO CARB STEADY)  237 mL Oral BID BM  . heparin  5,000 Units Subcutaneous 3 times per day  . insulin aspart  0-5 Units Subcutaneous QHS  . insulin aspart  0-9 Units Subcutaneous TID WC  . multivitamin  1 tablet Oral QHS  . nicotine  21 mg Transdermal Daily  . sodium chloride  3 mL Intravenous Q12H    Assessment/Plan: #1 ESRD: Likely in the setting of poorly controlled hypertension and diabetes.  -Reattempt HD today -Continue multivitamin  -Vascular following for access  #2 anemia of chronic disease: Consistent with elevated ferritin, normal iron on admission likely the setting of ESRD. Hemoglobin 8, baseline 10-11 from 2 years ago at her most recent hospitalization. Received PRBC 1.  #3 metabolic bone disease: PTH 640, vitamin D pending. Continue PhosLo  three times daily, calcitriol 0.25 mcg.   #4 hypertension: Systolic BP 170 to 190s.on amlodipine  for now. Reassess after HD.  #5 type 2 diabetes  #6 type I bipolar disorder   LOS: 3 days  Patel, Rushil 05/31/2015,10:14 AM  I have seen and examined this patient and agree with plan as outlined by Dr. Allena Katz.  She is amenable for HD today and is aware of the need for compliance with dialysis and vascular access placement prior to discharge.  Appreciate VVS assistance. Sammy Douthitt A,MD 05/31/2015 11:41 AM

## 2015-05-31 NOTE — Progress Notes (Signed)
Patient changed her mind again and stated that she will go to dialysis this morning.  Notified HD RN.

## 2015-05-31 NOTE — Progress Notes (Signed)
Late entry:  Earlier in shift, patient was scheduled to go to HD.  However, patient refused.  Stated she was tired and said she was told she would go to HD tomorrow.  Notified Trey Paula, HD RN, who stated he would notify Dr. Arrie Aran.  Notified NP on call.

## 2015-05-31 NOTE — Progress Notes (Signed)
[facility] PROGRESS NOTE  Jane Higgins VWU:[id number] DOB: [birth year] DOA: 05/28/2015 PCP: No primary care provider on file.   Brief Narrative Patient is a 59 year old with history of bipolar disorder, hypertension, diabetes mellitus who presented to the hospital with acute renal injury. Nephrology on board and patient currently undergoing workup  Assessment/Plan: Principal Problem:   ARF (acute renal failure) - Nephrology on board - Pt currently undergoing work up - Renal ultrasound reports no evidence of hydronephrosis - Pt to undergo dialysis 05/31/15, reportedly refused dialysis yesterday  Active Problems:   Hypertensive urgency - resolved. Continue amlodipine - Hydralazine on board prn    Hypokalemia - resolved on last check.    DM2 (diabetes mellitus, type 2) - Currently on SSI    Anemia - Most likely due to Renal failure - transfuse 1 unit given symptomatic anemia   Code Status: full Family Communication: no family at bedside.  Disposition Plan: Pending improvement in condition.   Consultants:  Nephrology  Procedures:  None  Antibiotics:  None  HPI/Subjective: Pt has no new complaints. No acute issues overnight.  Objective: Filed Vitals:   05/31/15 0811  BP: 152/71  Pulse: 85  Temp: 98 F (36.7 C)  Resp: 16    Intake/Output Summary (Last 24 hours) at 05/31/15 1250 Last data filed at 05/31/15 0849  Gross per 24 hour  Intake   2880 ml  Output    300 ml  Net   2580 ml   Filed Weights   05/28/15 2102 05/29/15 2052 05/30/15 2024  Weight: 51.03 kg (112 lb 8 oz) 53.524 kg (118 lb) 53.024 kg (116 lb 14.4 oz)    Exam:   General:  Pt in nad, alert and awake  Cardiovascular: rrr, no mrg  Respiratory: cta bl, no wheezes  Abdomen: soft, ND, NT  Musculoskeletal: no cyanosis or clubbing   Data Reviewed: Basic Metabolic Panel:  Recent Labs Lab 05/28/15 1526 05/28/15 1642 05/28/15 2203 05/29/15 0545 05/30/15 0819  05/31/15 0612  NA 142 142  --  142 144 141  K 2.9* 2.8*  --  3.4* 3.7 3.7  CL 105 109  --  108 115* 115*  CO2 12*  --   --  11* 11* 10*  GLUCOSE 213* 224*  --  145* 98 157*  BUN 136* 137*  --  134* 123* 114*  CREATININE 16.97* >18.00*  --  16.72* 14.88* 14.31*  CALCIUM 6.5*  --   --  6.3* 6.1* 6.5*  MG  --   --  1.8  --   --   --   PHOS  --   --  13.8*  --  10.9* 10.6*   Liver Function Tests:  Recent Labs Lab 05/28/15 2203 05/29/15 0545 05/30/15 0819 05/31/15 0612  AST 9* 10*  --   --   ALT 9* 9*  --   --   ALKPHOS 63 59  --   --   BILITOT 0.5 0.7  --   --   PROT 6.8 6.1*  --   --   ALBUMIN 3.6 3.5 3.1* 3.2*   No results for input(s): LIPASE, AMYLASE in the last 168 hours. No results for input(s): AMMONIA in the last 168 hours. CBC:  Recent Labs Lab 05/28/15 1526 05/28/15 1642 05/29/15 0545 05/29/15 1617 05/30/15 0512  WBC 9.2  --  10.4  --  11.9*  NEUTROABS  --   --   --   --  7.7  HGB 6.8* 7.8* 6.7*  7.4* 7.6*  HCT 20.6* 23.0* 20.6* 22.2* 23.4*  MCV 78.9  --  79.5  --  79.3  PLT 286  --  267  --  272   Cardiac Enzymes:  Recent Labs Lab 05/28/15 2203  CKTOTAL 330*   BNP (last 3 results) No results for input(s): BNP in the last 8760 hours.  ProBNP (last 3 results) No results for input(s): PROBNP in the last 8760 hours.  CBG:  Recent Labs Lab 05/30/15 1117 05/30/15 1628 05/30/15 2214 05/31/15 0743 05/31/15 1147  GLUCAP 94 93 126* 135* 129*    Recent Results (from the past 240 hour(s))  Urine culture     Status: None   Collection Time: 05/28/15  6:15 PM  Result Value Ref Range Status   Specimen Description URINE, RANDOM  Final   Special Requests NONE  Final   Culture MULTIPLE SPECIES PRESENT, SUGGEST RECOLLECTION  Final   Report Status 05/30/2015 FINAL  Final  C difficile quick scan w PCR reflex     Status: None   Collection Time: 05/29/15  9:10 AM  Result Value Ref Range Status   C Diff antigen NEGATIVE NEGATIVE Final   C Diff toxin  NEGATIVE NEGATIVE Final   C Diff interpretation Negative for toxigenic C. difficile  Final     Studies: Ir Fluoro Guide Cv Line Right  05/30/2015   CLINICAL DATA:  End-stage renal disease, needs venous access for hemodialysis  EXAM: TUNNELED HEMODIALYSIS CATHETER PLACEMENT WITH ULTRASOUND AND FLUOROSCOPIC GUIDANCE  TECHNIQUE: The procedure, risks, benefits, and alternatives were explained to the patient. Questions regarding the procedure were encouraged and answered. The patient understands and consents to the procedure. As antibiotic prophylaxis, cefazolin 2 g was ordered pre-procedure and administered intravenously within one hour of incision.Patency of the right IJ vein was confirmed with ultrasound with image documentation. An appropriate skin site was determined. Region was prepped using maximum barrier technique including cap and mask, sterile gown, sterile gloves, large sterile sheet, and Chlorhexidine as cutaneous antisepsis. The region was infiltrated locally with 1% lidocaine.  Intravenous Fentanyl and Versed were administered as conscious sedation during continuous cardiorespiratory monitoring by the radiology RN, with a total moderate sedation time of 15 minutes.  Under real-time ultrasound guidance, the right IJ vein was accessed with a 21 gauge micropuncture needle; the needle tip within the vein was confirmed with ultrasound image documentation. Needle exchanged over the 018 guidewire for transitional dilator, which allowed advancement of a Benson wire into the IVC. Over this, an MPA catheter was advanced. A Palindrome 23 hemodialysis catheter was tunneled from the right anterior chest wall approach to the right IJ dermatotomy site. The MPA catheter was exchanged over an Amplatz wire for serial vascular dilators which allow placement of a peel-away sheath, through which the catheter was advanced under intermittent fluoroscopy, positioned with its tips in the proximal and midright atrium. Spot  chest radiograph confirms good catheter position. No pneumothorax. Catheter was flushed and primed per protocol. Catheter secured externally with O Prolene sutures. The right IJ dermatotomy site was closed with Dermabond.  COMPLICATIONS: COMPLICATIONS None immediate  FLUOROSCOPY TIME:  36 seconds ,10.2 mGy  COMPARISON:  None  IMPRESSION: 1. Technically successful placement of tunneled right IJ hemodialysis catheter with ultrasound and fluoroscopic guidance. Ready for routine use.   Electronically Signed   By: Corlis Leak M.D.   On: 05/30/2015 15:39   Ir US Guide Vasc Access Right  05/30/2015   CLINICAL DATA:  End-stage renal disease, needs  venous access for hemodialysis  EXAM: TUNNELED HEMODIALYSIS CATHETER PLACEMENT WITH ULTRASOUND AND FLUOROSCOPIC GUIDANCE  TECHNIQUE: The procedure, risks, benefits, and alternatives were explained to the patient. Questions regarding the procedure were encouraged and answered. The patient understands and consents to the procedure. As antibiotic prophylaxis, cefazolin 2 g was ordered pre-procedure and administered intravenously within one hour of incision.Patency of the right IJ vein was confirmed with ultrasound with image documentation. An appropriate skin site was determined. Region was prepped using maximum barrier technique including cap and mask, sterile gown, sterile gloves, large sterile sheet, and Chlorhexidine as cutaneous antisepsis. The region was infiltrated locally with 1% lidocaine.  Intravenous Fentanyl and Versed were administered as conscious sedation during continuous cardiorespiratory monitoring by the radiology RN, with a total moderate sedation time of 15 minutes.  Under real-time ultrasound guidance, the right IJ vein was accessed with a 21 gauge micropuncture needle; the needle tip within the vein was confirmed with ultrasound image documentation. Needle exchanged over the 018 guidewire for transitional dilator, which allowed advancement of a Benson wire  into the IVC. Over this, an MPA catheter was advanced. A Palindrome 23 hemodialysis catheter was tunneled from the right anterior chest wall approach to the right IJ dermatotomy site. The MPA catheter was exchanged over an Amplatz wire for serial vascular dilators which allow placement of a peel-away sheath, through which the catheter was advanced under intermittent fluoroscopy, positioned with its tips in the proximal and midright atrium. Spot chest radiograph confirms good catheter position. No pneumothorax. Catheter was flushed and primed per protocol. Catheter secured externally with O Prolene sutures. The right IJ dermatotomy site was closed with Dermabond.  COMPLICATIONS: COMPLICATIONS None immediate  FLUOROSCOPY TIME:  36 seconds ,10.2 mGy  COMPARISON:  None  IMPRESSION: 1. Technically successful placement of tunneled right IJ hemodialysis catheter with ultrasound and fluoroscopic guidance. Ready for routine use.   Electronically Signed   By: Corlis Leak M.D.   On: 05/30/2015 15:39    Scheduled Meds: . amLODipine  5 mg Oral Daily  . calcitRIOL  0.25 mcg Oral Daily  . calcium acetate  1,334 mg Oral TID WC  . docusate sodium  100 mg Oral BID  . feeding supplement (NEPRO CARB STEADY)  237 mL Oral BID BM  . heparin  5,000 Units Subcutaneous 3 times per day  . insulin aspart  0-5 Units Subcutaneous QHS  . insulin aspart  0-9 Units Subcutaneous TID WC  . multivitamin  1 tablet Oral QHS  . nicotine  21 mg Transdermal Daily  . sodium chloride  3 mL Intravenous Q12H   Continuous Infusions:    Time spent: > 35 minutes   Penny Pia  [facility] Pager [phone] If 7PM-7AM, please contact night-coverage at [email or web address], password [email or web address] 05/31/2015, 12:50 PM  LOS: 3 days

## 2015-05-31 NOTE — Progress Notes (Addendum)
This morning, patient initially agreed to go to dialysis, but later refused, saying that she would like to speak to the MD before initiating her first session.  Patient stated that someone had told her to "wait a few days" after catheter placement before undergoing dialysis.  Educated patient on MD orders, but still refused.  Notified HD RN, Trey Paula, and will notify on-coming day shift RN.  Patient also seemed to lack knowledge regarding hemodialysis.  Education Financial planner and given to patient to read.  Will continue to monitor.

## 2015-06-01 DIAGNOSIS — N189 Chronic kidney disease, unspecified: Secondary | ICD-10-CM

## 2015-06-01 DIAGNOSIS — E876 Hypokalemia: Secondary | ICD-10-CM

## 2015-06-01 DIAGNOSIS — N179 Acute kidney failure, unspecified: Principal | ICD-10-CM

## 2015-06-01 DIAGNOSIS — E1122 Type 2 diabetes mellitus with diabetic chronic kidney disease: Secondary | ICD-10-CM

## 2015-06-01 DIAGNOSIS — D649 Anemia, unspecified: Secondary | ICD-10-CM

## 2015-06-01 DIAGNOSIS — I10 Essential (primary) hypertension: Secondary | ICD-10-CM

## 2015-06-01 LAB — CBC
HCT: 19.4 % — ABNORMAL LOW (ref 36.0–46.0)
HEMOGLOBIN: 6.3 g/dL — AB (ref 12.0–15.0)
MCH: 26 pg (ref 26.0–34.0)
MCHC: 32.5 g/dL (ref 30.0–36.0)
MCV: 80.2 fL (ref 78.0–100.0)
Platelets: 121 10*3/uL — ABNORMAL LOW (ref 150–400)
RBC: 2.42 MIL/uL — AB (ref 3.87–5.11)
RDW: 15.9 % — ABNORMAL HIGH (ref 11.5–15.5)
WBC: 10.9 10*3/uL — AB (ref 4.0–10.5)

## 2015-06-01 LAB — RENAL FUNCTION PANEL
Albumin: 2.8 g/dL — ABNORMAL LOW (ref 3.5–5.0)
Anion gap: 11 (ref 5–15)
BUN: 62 mg/dL — ABNORMAL HIGH (ref 6–20)
CO2: 19 mmol/L — ABNORMAL LOW (ref 22–32)
Calcium: 6.8 mg/dL — ABNORMAL LOW (ref 8.9–10.3)
Chloride: 108 mmol/L (ref 101–111)
Creatinine, Ser: 9 mg/dL — ABNORMAL HIGH (ref 0.44–1.00)
GFR calc Af Amer: 5 mL/min — ABNORMAL LOW
GFR calc non Af Amer: 4 mL/min — ABNORMAL LOW
Glucose, Bld: 94 mg/dL (ref 65–99)
Phosphorus: 5.8 mg/dL — ABNORMAL HIGH (ref 2.5–4.6)
Potassium: 2.9 mmol/L — ABNORMAL LOW (ref 3.5–5.1)
Sodium: 138 mmol/L (ref 135–145)

## 2015-06-01 LAB — PREPARE RBC (CROSSMATCH)

## 2015-06-01 LAB — HEPATITIS B SURFACE ANTIGEN: Hepatitis B Surface Ag: NEGATIVE

## 2015-06-01 LAB — GLUCOSE, CAPILLARY
GLUCOSE-CAPILLARY: 157 mg/dL — AB (ref 65–99)
GLUCOSE-CAPILLARY: 102 mg/dL — AB (ref 65–99)
Glucose-Capillary: 113 mg/dL — ABNORMAL HIGH (ref 65–99)
Glucose-Capillary: 140 mg/dL — ABNORMAL HIGH (ref 65–99)

## 2015-06-01 LAB — HEPATITIS B CORE ANTIBODY, TOTAL: Hep B Core Total Ab: NEGATIVE

## 2015-06-01 LAB — HEPATITIS B SURFACE ANTIBODY,QUALITATIVE: Hep B S Ab: NONREACTIVE

## 2015-06-01 MED ORDER — HEPARIN SODIUM (PORCINE) 1000 UNIT/ML DIALYSIS: 1000.0000 [IU] | INTRAMUSCULAR | Status: DC | PRN

## 2015-06-01 MED ORDER — ALTEPLASE 2 MG IJ SOLR
2.0000 mg | Freq: Once | INTRAMUSCULAR | Status: AC | PRN
  Administered 2015-06-01: 2 mg
  Filled 2015-06-01 (×3): qty 2

## 2015-06-01 MED ORDER — SODIUM CHLORIDE 0.9 % IV SOLN: 100.0000 mL | INTRAVENOUS | Status: DC | PRN

## 2015-06-01 MED ORDER — LIDOCAINE-PRILOCAINE 2.5-2.5 % EX CREA
1.0000 "application " | TOPICAL_CREAM | CUTANEOUS | Status: DC | PRN
  Filled 2015-06-01: qty 5

## 2015-06-01 MED ORDER — HEPARIN SODIUM (PORCINE) 1000 UNIT/ML DIALYSIS: 20.0000 [IU]/kg | INTRAMUSCULAR | Status: DC | PRN

## 2015-06-01 MED ORDER — NEPRO/CARBSTEADY PO LIQD: 237.0000 mL | ORAL | Status: DC | PRN

## 2015-06-01 MED ORDER — LIDOCAINE HCL (PF) 1 % IJ SOLN: 5.0000 mL | INTRAMUSCULAR | Status: DC | PRN

## 2015-06-01 MED ORDER — PENTAFLUOROPROP-TETRAFLUOROETH EX AERO: 1.0000 "application " | INHALATION_SPRAY | CUTANEOUS | Status: DC | PRN

## 2015-06-01 MED ORDER — SODIUM CHLORIDE 0.9 % IV SOLN: Freq: Once | INTRAVENOUS | Status: DC

## 2015-06-01 NOTE — Progress Notes (Signed)
Patient ID: Jane Higgins, female   DOB: April 13, 1956, 59 y.o.   MRN: [id number] Patient has tolerated hemodialysis via her catheter. Again discussed need for long-term access. IV sedation removed from right arm. Antecubital vein is patent. Will plan surgery on Friday with right arm AV fistula versus AV graft

## 2015-06-01 NOTE — Care Management Important Message (Signed)
Important Message  Patient Details  Name: Jane Higgins MRN: [id number] Date of Birth: [birth year]   Medicare Important Message Given:  Yes-second notification given    Orson Aloe 06/01/2015, 10:55 AM

## 2015-06-01 NOTE — Progress Notes (Signed)
[facility] PROGRESS NOTE  Jane Higgins:[id number] DOB: 02-May-1956 DOA: 05/28/2015  PCP: No primary care provider on file.  Brief HPI: 59 year old African-American female with a past medical history of bipolar disorder, hypertension, diabetes, presented with acute kidney injury. Nephrology was consulted. Dialysis access was obtained. She was started on dialysis.   Past medical history:  Past Medical History  Diagnosis Date  . Bipolar 1 disorder   . Hypertension   . Diabetes mellitus without complication     Consultants: Nephrology, vascular surgery  Procedures: Hemodialysis catheter placement  Antibiotics: None  Subjective: Patient denies any complaints this morning. Still not grasping the importance of dialysis. Denies nausea, vomiting. No chest pain.  Objective: Vital Signs  Filed Vitals:   05/31/15 1659 05/31/15 2059 06/01/15 0638 06/01/15 1010  BP: 172/79 163/78 145/79 142/85  Pulse: 82 78 83 92  Temp: 97.9 F (36.6 C) 99 F (37.2 C) 98.3 F (36.8 C) 98.2 F (36.8 C)  TempSrc: Oral Oral Oral Oral  Resp: 18 16 18 18   Height:      Weight:      SpO2: 99% 99% 98% 98%    Intake/Output Summary (Last 24 hours) at 06/01/15 1235 Last data filed at 06/01/15 1010  Gross per 24 hour  Intake    960 ml  Output    100 ml  Net    860 ml   Filed Weights   05/30/15 2024 05/31/15 1404 05/31/15 1636  Weight: 53.024 kg (116 lb 14.4 oz) 54.7 kg (120 lb 9.5 oz) 54.7 kg (120 lb 9.5 oz)    General appearance: alert, cooperative, appears stated age and no distress Resp: clear to auscultation bilaterally Cardio: regular rate and rhythm, S1, S2 normal, no murmur, click, rub or gallop GI: soft, non-tender; bowel sounds normal; no masses,  no organomegaly Extremities: extremities normal, atraumatic, no cyanosis or edema Neurologic: No focal deficits  Lab Results:  Basic Metabolic Panel:  Recent Labs Lab 05/28/15 1526 05/28/15 1642 05/28/15 2203  05/29/15 0545 05/30/15 0819 05/31/15 0612 06/01/15 0508  NA 142 142  --  142 144 141 138  K 2.9* 2.8*  --  3.4* 3.7 3.7 2.9*  CL 105 109  --  108 115* 115* 108  CO2 12*  --   --  11* 11* 10* 19*  GLUCOSE 213* 224*  --  145* 98 157* 94  BUN 136* 137*  --  134* 123* 114* 62*  CREATININE 16.97* >18.00*  --  16.72* 14.88* 14.31* 9.00*  CALCIUM 6.5*  --   --  6.3* 6.1* 6.5* 6.8*  MG  --   --  1.8  --   --   --   --   PHOS  --   --  13.8*  --  10.9* 10.6* 5.8*   Liver Function Tests:  Recent Labs Lab 05/28/15 2203 05/29/15 0545 05/30/15 0819 05/31/15 0612 06/01/15 0508  AST 9* 10*  --   --   --   ALT 9* 9*  --   --   --   ALKPHOS 63 59  --   --   --   BILITOT 0.5 0.7  --   --   --   PROT 6.8 6.1*  --   --   --   ALBUMIN 3.6 3.5 3.1* 3.2* 2.8*   CBC:  Recent Labs Lab 05/28/15 1526  05/29/15 0545 05/29/15 1617 05/30/15 0512 05/31/15 1416 06/01/15 0508  WBC 9.2  --  10.4  --  11.9* 11.1* 10.9*  NEUTROABS  --   --   --   --  7.7  --   --   HGB 6.8*  < > 6.7* 7.4* 7.6* 7.1* 6.3*  HCT 20.6*  < > 20.6* 22.2* 23.4* 21.5* 19.4*  MCV 78.9  --  79.5  --  79.3 80.5 80.2  PLT 286  --  267  --  272 236 121*  < > = values in this interval not displayed.  Cardiac Enzymes:  Recent Labs Lab 05/28/15 2203  CKTOTAL 330*    CBG:  Recent Labs Lab 05/31/15 1147 05/31/15 1658 05/31/15 2058 06/01/15 0831 06/01/15 1132  GLUCAP 129* 138* 203* 113* 157*    Recent Results (from the past 240 hour(s))  Urine culture     Status: None   Collection Time: 05/28/15  6:15 PM  Result Value Ref Range Status   Specimen Description URINE, RANDOM  Final   Special Requests NONE  Final   Culture MULTIPLE SPECIES PRESENT, SUGGEST RECOLLECTION  Final   Report Status 05/30/2015 FINAL  Final  C difficile quick scan w PCR reflex     Status: None   Collection Time: 05/29/15  9:10 AM  Result Value Ref Range Status   C Diff antigen NEGATIVE NEGATIVE Final   C Diff toxin NEGATIVE NEGATIVE  Final   C Diff interpretation Negative for toxigenic C. difficile  Final      Studies/Results: Ir Fluoro Guide Cv Line Right  05/30/2015   CLINICAL DATA:  End-stage renal disease, needs venous access for hemodialysis  EXAM: TUNNELED HEMODIALYSIS CATHETER PLACEMENT WITH ULTRASOUND AND FLUOROSCOPIC GUIDANCE  TECHNIQUE: The procedure, risks, benefits, and alternatives were explained to the patient. Questions regarding the procedure were encouraged and answered. The patient understands and consents to the procedure. As antibiotic prophylaxis, cefazolin 2 g was ordered pre-procedure and administered intravenously within one hour of incision.Patency of the right IJ vein was confirmed with ultrasound with image documentation. An appropriate skin site was determined. Region was prepped using maximum barrier technique including cap and mask, sterile gown, sterile gloves, large sterile sheet, and Chlorhexidine as cutaneous antisepsis. The region was infiltrated locally with 1% lidocaine.  Intravenous Fentanyl and Versed were administered as conscious sedation during continuous cardiorespiratory monitoring by the radiology RN, with a total moderate sedation time of 15 minutes.  Under real-time ultrasound guidance, the right IJ vein was accessed with a 21 gauge micropuncture needle; the needle tip within the vein was confirmed with ultrasound image documentation. Needle exchanged over the 018 guidewire for transitional dilator, which allowed advancement of a Benson wire into the IVC. Over this, an MPA catheter was advanced. A Palindrome 23 hemodialysis catheter was tunneled from the right anterior chest wall approach to the right IJ dermatotomy site. The MPA catheter was exchanged over an Amplatz wire for serial vascular dilators which allow placement of a peel-away sheath, through which the catheter was advanced under intermittent fluoroscopy, positioned with its tips in the proximal and midright atrium. Spot chest  radiograph confirms good catheter position. No pneumothorax. Catheter was flushed and primed per protocol. Catheter secured externally with O Prolene sutures. The right IJ dermatotomy site was closed with Dermabond.  COMPLICATIONS: COMPLICATIONS None immediate  FLUOROSCOPY TIME:  36 seconds ,10.2 mGy  COMPARISON:  None  IMPRESSION: 1. Technically successful placement of tunneled right IJ hemodialysis catheter with ultrasound and fluoroscopic guidance. Ready for routine use.   Electronically Signed   By: Ronald Pippins.D.  On: 05/30/2015 15:39   Ir US Guide Vasc Access Right  05/30/2015   CLINICAL DATA:  End-stage renal disease, needs venous access for hemodialysis  EXAM: TUNNELED HEMODIALYSIS CATHETER PLACEMENT WITH ULTRASOUND AND FLUOROSCOPIC GUIDANCE  TECHNIQUE: The procedure, risks, benefits, and alternatives were explained to the patient. Questions regarding the procedure were encouraged and answered. The patient understands and consents to the procedure. As antibiotic prophylaxis, cefazolin 2 g was ordered pre-procedure and administered intravenously within one hour of incision.Patency of the right IJ vein was confirmed with ultrasound with image documentation. An appropriate skin site was determined. Region was prepped using maximum barrier technique including cap and mask, sterile gown, sterile gloves, large sterile sheet, and Chlorhexidine as cutaneous antisepsis. The region was infiltrated locally with 1% lidocaine.  Intravenous Fentanyl and Versed were administered as conscious sedation during continuous cardiorespiratory monitoring by the radiology RN, with a total moderate sedation time of 15 minutes.  Under real-time ultrasound guidance, the right IJ vein was accessed with a 21 gauge micropuncture needle; the needle tip within the vein was confirmed with ultrasound image documentation. Needle exchanged over the 018 guidewire for transitional dilator, which allowed advancement of a Benson wire into the  IVC. Over this, an MPA catheter was advanced. A Palindrome 23 hemodialysis catheter was tunneled from the right anterior chest wall approach to the right IJ dermatotomy site. The MPA catheter was exchanged over an Amplatz wire for serial vascular dilators which allow placement of a peel-away sheath, through which the catheter was advanced under intermittent fluoroscopy, positioned with its tips in the proximal and midright atrium. Spot chest radiograph confirms good catheter position. No pneumothorax. Catheter was flushed and primed per protocol. Catheter secured externally with O Prolene sutures. The right IJ dermatotomy site was closed with Dermabond.  COMPLICATIONS: COMPLICATIONS None immediate  FLUOROSCOPY TIME:  36 seconds ,10.2 mGy  COMPARISON:  None  IMPRESSION: 1. Technically successful placement of tunneled right IJ hemodialysis catheter with ultrasound and fluoroscopic guidance. Ready for routine use.   Electronically Signed   By: Corlis Leak M.D.   On: 05/30/2015 15:39    Medications:  Scheduled: . sodium chloride   Intravenous Once  . amLODipine  5 mg Oral Daily  . calcitRIOL  0.25 mcg Oral Daily  . calcium acetate  1,334 mg Oral TID WC  . docusate sodium  100 mg Oral BID  . feeding supplement (NEPRO CARB STEADY)  237 mL Oral BID BM  . insulin aspart  0-5 Units Subcutaneous QHS  . insulin aspart  0-9 Units Subcutaneous TID WC  . multivitamin  1 tablet Oral QHS  . nicotine  21 mg Transdermal Daily  . sodium chloride  3 mL Intravenous Q12H   Continuous:  Higgins:[id number] **OR** acetaminophen, alum & mag hydroxide-simeth, hydrALAZINE, ondansetron **OR** ondansetron (ZOFRAN) IV, senna-docusate  Assessment/Plan:  Principal Problem:   ARF (acute renal failure) Active Problems:   Hypertensive urgency   Hypokalemia   Weight loss   DM2 (diabetes mellitus, type 2)   Anemia   ESRD needing dialysis   Malnutrition of moderate degree    ARF (acute renal failure) - Nephrology on  board. Dialysis catheter was placed. Initially, patient declined dialysis, but then underwent dialysis yesterday. Further plans per nephrology. Vascular surgery also following for a more permanent access placement. - Renal ultrasound reports no evidence of hydronephrosis  Hypertensive urgency - resolved. Continue amlodipine. Further blood pressure management per nephrology. - Hydralazine on board prn  Hypokalemia - To be addressed by  nephrology  DM2 (diabetes mellitus, type 2) - Currently on SSI. HbA1c was 6.0. Patient was on insulin 70/30 at home. Consider starting basal insulin low doses depending on blood sugars over the next day or so.  Anemia likely secondary to chronic disease - Most likely due to Renal failure - No evidence for bleeding. Hemoglobin noted to be lower this morning. She'll be transfused 2 units of blood today.   Thrombocytopenia Sudden drop in platelet count is noted. She was on subcutaneous heparin which has been discontinued. Continue to monitor counts.  DVT Prophylaxis: On SCDs now    Code Status: Full code  Family Communication: Discussed with the patient  Disposition Plan: Continue management as outlined above. Discharge when cleared by nephrology.    LOS: 4 days   [facility]  [facility] Pager [phone] 06/01/2015, 12:35 PM  If 7PM-7AM, please contact night-coverage at [email or web address], password [facility]

## 2015-06-01 NOTE — Progress Notes (Signed)
CRITICAL VALUE ALERT  Critical value received:  Hemoglobin=6.3`  Date of notification:  06/01/15  Time of notification:  06:56  Critical value read back:Yes.    Nurse who received alert:  Kermit Balo  MD notified (1st page):  Cordella Register  Time of first page:  07:26  MD notified (2nd page):  Time of second page:  Responding MD:  Cordella Register ( MD was on the unit,MD acknowledged that he received result of hgb via text)  Time MD responded: 07:40

## 2015-06-01 NOTE — Progress Notes (Signed)
Subjective: No complaints this AM. Tolerated HD well yesterday.  Objective: Vital signs in last 24 hours: Temp:  [97.1 F (36.2 C)-99 F (37.2 C)] 98.3 F (36.8 C) (08/24 1610) Pulse Rate:  [69-96] 83 (08/24 0638) Resp:  [15-18] 18 (08/24 0638) BP: (145-186)/(71-95) 145/79 mmHg (08/24 0638) SpO2:  [96 %-100 %] 98 % (08/24 0638) Weight:  [120 lb 9.5 oz (54.7 kg)] 120 lb 9.5 oz (54.7 kg) (08/23 1636) Weight change: 3 lb 11.1 oz (1.676 kg)  Intake/Output from previous day: 08/23 0701 - 08/24 0700 In: 960 [P.O.:960] Out: 100 [Urine:100] Intake/Output this shift:   General: Middle-aged African-American female, resting in bed, NAD Cardiac: RRR, no rubs, murmurs or gallops Pulm: clear to auscultation bilaterally in anterior lung fields, no wheezes, rales, or rhonchi Abd: soft, nontender, nondistended, BS present Ext: warm and well perfused, no pedal edema, R IJ tunneled catheter Neuro: moving all extremities freely   Lab Results:  Recent Labs  05/31/15 1416 06/01/15 0508  WBC 11.1* 10.9*  HGB 7.1* 6.3*  HCT 21.5* 19.4*  PLT 236 PENDING   BMET:   Recent Labs  05/31/15 0612 06/01/15 0508  NA 141 138  K 3.7 2.9*  CL 115* 108  CO2 10* 19*  GLUCOSE 157* 94  BUN 114* 62*  CREATININE 14.31* 9.00*  CALCIUM 6.5* 6.8*   Iron Studies:  No results for input(s): IRON, TIBC, TRANSFERRIN, FERRITIN in the last 72 hours. Studies/Results: Ir Fluoro Guide Cv Line Right  05/30/2015   CLINICAL DATA:  End-stage renal disease, needs venous access for hemodialysis  EXAM: TUNNELED HEMODIALYSIS CATHETER PLACEMENT WITH ULTRASOUND AND FLUOROSCOPIC GUIDANCE  TECHNIQUE: The procedure, risks, benefits, and alternatives were explained to the patient. Questions regarding the procedure were encouraged and answered. The patient understands and consents to the procedure. As antibiotic prophylaxis, cefazolin 2 g was ordered pre-procedure and administered intravenously within one hour of  incision.Patency of the right IJ vein was confirmed with ultrasound with image documentation. An appropriate skin site was determined. Region was prepped using maximum barrier technique including cap and mask, sterile gown, sterile gloves, large sterile sheet, and Chlorhexidine as cutaneous antisepsis. The region was infiltrated locally with 1% lidocaine.  Intravenous Fentanyl and Versed were administered as conscious sedation during continuous cardiorespiratory monitoring by the radiology RN, with a total moderate sedation time of 15 minutes.  Under real-time ultrasound guidance, the right IJ vein was accessed with a 21 gauge micropuncture needle; the needle tip within the vein was confirmed with ultrasound image documentation. Needle exchanged over the 018 guidewire for transitional dilator, which allowed advancement of a Benson wire into the IVC. Over this, an MPA catheter was advanced. A Palindrome 23 hemodialysis catheter was tunneled from the right anterior chest wall approach to the right IJ dermatotomy site. The MPA catheter was exchanged over an Amplatz wire for serial vascular dilators which allow placement of a peel-away sheath, through which the catheter was advanced under intermittent fluoroscopy, positioned with its tips in the proximal and midright atrium. Spot chest radiograph confirms good catheter position. No pneumothorax. Catheter was flushed and primed per protocol. Catheter secured externally with O Prolene sutures. The right IJ dermatotomy site was closed with Dermabond.  COMPLICATIONS: COMPLICATIONS None immediate  FLUOROSCOPY TIME:  36 seconds ,10.2 mGy  COMPARISON:  None  IMPRESSION: 1. Technically successful placement of tunneled right IJ hemodialysis catheter with ultrasound and fluoroscopic guidance. Ready for routine use.   Electronically Signed   By: Ronald Pippins.D.  On: 05/30/2015 15:39   Ir US Guide Vasc Access Right  05/30/2015   CLINICAL DATA:  End-stage renal disease, needs  venous access for hemodialysis  EXAM: TUNNELED HEMODIALYSIS CATHETER PLACEMENT WITH ULTRASOUND AND FLUOROSCOPIC GUIDANCE  TECHNIQUE: The procedure, risks, benefits, and alternatives were explained to the patient. Questions regarding the procedure were encouraged and answered. The patient understands and consents to the procedure. As antibiotic prophylaxis, cefazolin 2 g was ordered pre-procedure and administered intravenously within one hour of incision.Patency of the right IJ vein was confirmed with ultrasound with image documentation. An appropriate skin site was determined. Region was prepped using maximum barrier technique including cap and mask, sterile gown, sterile gloves, large sterile sheet, and Chlorhexidine as cutaneous antisepsis. The region was infiltrated locally with 1% lidocaine.  Intravenous Fentanyl and Versed were administered as conscious sedation during continuous cardiorespiratory monitoring by the radiology RN, with a total moderate sedation time of 15 minutes.  Under real-time ultrasound guidance, the right IJ vein was accessed with a 21 gauge micropuncture needle; the needle tip within the vein was confirmed with ultrasound image documentation. Needle exchanged over the 018 guidewire for transitional dilator, which allowed advancement of a Benson wire into the IVC. Over this, an MPA catheter was advanced. A Palindrome 23 hemodialysis catheter was tunneled from the right anterior chest wall approach to the right IJ dermatotomy site. The MPA catheter was exchanged over an Amplatz wire for serial vascular dilators which allow placement of a peel-away sheath, through which the catheter was advanced under intermittent fluoroscopy, positioned with its tips in the proximal and midright atrium. Spot chest radiograph confirms good catheter position. No pneumothorax. Catheter was flushed and primed per protocol. Catheter secured externally with O Prolene sutures. The right IJ dermatotomy site was  closed with Dermabond.  COMPLICATIONS: COMPLICATIONS None immediate  FLUOROSCOPY TIME:  36 seconds ,10.2 mGy  COMPARISON:  None  IMPRESSION: 1. Technically successful placement of tunneled right IJ hemodialysis catheter with ultrasound and fluoroscopic guidance. Ready for routine use.   Electronically Signed   By: Corlis Leak M.D.   On: 05/30/2015 15:39   Scheduled: . amLODipine  5 mg Oral Daily  . calcitRIOL  0.25 mcg Oral Daily  . calcium acetate  1,334 mg Oral TID WC  . docusate sodium  100 mg Oral BID  . feeding supplement (NEPRO CARB STEADY)  237 mL Oral BID BM  . heparin  5,000 Units Subcutaneous 3 times per day  . insulin aspart  0-5 Units Subcutaneous QHS  . insulin aspart  0-9 Units Subcutaneous TID WC  . multivitamin  1 tablet Oral QHS  . nicotine  21 mg Transdermal Daily  . sodium chloride  3 mL Intravenous Q12H    Assessment/Plan: #1 ESRD: Likely in the setting of poorly controlled hypertension and diabetes. Trialed HD yesterday. Will talk with team about retrying today with higher K bath. -Continue multivitamin  -Follow-up with [facility] today for HD access plan  #2 Thrombocytopenia: Platelets in the 100s down from 200s since admission. Concern for HIT. Also concomitant drop in Hb. Plan to transfuse with HD if she goes. Holding heparin.  #3 anemia of chronic disease: Consistent with elevated ferritin, normal iron on admission likely the setting of ESRD. Hemoglobin 6 this AM baseline 10-11 from 2 years ago at her most recent hospitalization. Received PRBC 1 and will transfuse per primary.   #4 metabolic bone disease: PTH 640, vitamin D pending. Continue PhosLo 1334mg  three times daily, calcitriol 0.25 mcg.   #  5 hypertension: Systolic BP 140s to 170s.on amlodipine 5mg  for now. .  #6 type 2 diabetes  #7 type I bipolar disorder   LOS: 4 days   Heywood Iles 06/01/2015,7:41 AM  I have seen and examined this patient and agree with plan as outlined by Dr. Allena Katz.  Pt with ESRD and  awaiting permanent access (scheduled for Friday 06/03/15) before she can be accepted at an outpatient facility.  Concerned over her nonadherence with HD treatments and question her ability for regular follow up after discharge.  Plan for HD today and again tomorrow and await AVF/AVG creation Friday. Troye Hiemstra A,MD 06/01/2015 3:24 PM

## 2015-06-02 DIAGNOSIS — D696 Thrombocytopenia, unspecified: Secondary | ICD-10-CM

## 2015-06-02 LAB — BASIC METABOLIC PANEL
ANION GAP: 9 (ref 5–15)
BUN: 31 mg/dL — ABNORMAL HIGH (ref 6–20)
CALCIUM: 6.8 mg/dL — AB (ref 8.9–10.3)
CO2: 25 mmol/L (ref 22–32)
Chloride: 103 mmol/L (ref 101–111)
Creatinine, Ser: 5.37 mg/dL — ABNORMAL HIGH (ref 0.44–1.00)
GFR, EST AFRICAN AMERICAN: 9 mL/min — AB (ref >60)
GFR, EST NON AFRICAN AMERICAN: 8 mL/min — AB (ref >60)
Glucose, Bld: 90 mg/dL (ref 65–99)
POTASSIUM: 2.7 mmol/L — AB (ref 3.5–5.1)
Sodium: 137 mmol/L (ref 135–145)

## 2015-06-02 LAB — TYPE AND SCREEN
ABO/RH(D): A POS
Antibody Screen: NEGATIVE
Unit division: 0
Unit division: 0

## 2015-06-02 LAB — GLUCOSE, CAPILLARY
GLUCOSE-CAPILLARY: 164 mg/dL — AB (ref 65–99)
Glucose-Capillary: 98 mg/dL (ref 65–99)
Glucose-Capillary: 90 mg/dL (ref 65–99)

## 2015-06-02 LAB — CBC
HEMATOCRIT: 26.3 % — AB (ref 36.0–46.0)
Hemoglobin: 8.8 g/dL — ABNORMAL LOW (ref 12.0–15.0)
MCH: 27.6 pg (ref 26.0–34.0)
MCHC: 33.5 g/dL (ref 30.0–36.0)
MCV: 82.4 fL (ref 78.0–100.0)
Platelets: 80 10*3/uL — ABNORMAL LOW (ref 150–400)
RBC: 3.19 MIL/uL — AB (ref 3.87–5.11)
RDW: 14.9 % (ref 11.5–15.5)
WBC: 10.5 10*3/uL (ref 4.0–10.5)

## 2015-06-02 LAB — HEPARIN INDUCED PLATELET AB (HIT ANTIBODY): HEPARIN INDUCED PLT AB: 0.194 {OD_unit} (ref 0.000–0.400)

## 2015-06-02 LAB — MAGNESIUM: MAGNESIUM: 1.6 mg/dL — AB (ref 1.7–2.4)

## 2015-06-02 MED ORDER — DEXTROSE 5 % IV SOLN
1.5000 g | INTRAVENOUS | Status: AC
  Administered 2015-06-03: 1.5 g via INTRAVENOUS
  Filled 2015-06-02 (×2): qty 1.5

## 2015-06-02 MED ORDER — DARBEPOETIN ALFA 60 MCG/0.3ML IJ SOSY
60.0000 ug | PREFILLED_SYRINGE | INTRAMUSCULAR | Status: DC
  Administered 2015-06-04: 60 ug via INTRAVENOUS
  Filled 2015-06-02: qty 0.3

## 2015-06-02 MED ORDER — DOXERCALCIFEROL 4 MCG/2ML IV SOLN
4.0000 ug | INTRAVENOUS | Status: DC
  Administered 2015-06-04: 4 ug via INTRAVENOUS
  Filled 2015-06-02 (×2): qty 2

## 2015-06-02 NOTE — Progress Notes (Signed)
[facility] PROGRESS NOTE  Jane Higgins UJW:[id number] DOB: 1955/11/02 DOA: 05/28/2015  PCP: No primary care provider on file.  Brief HPI: 59 year old African-American female with a past medical history of bipolar disorder, hypertension, diabetes, presented with acute kidney injury. Nephrology was consulted. Dialysis access was obtained. She was started on dialysis.   Past medical history:  Past Medical History  Diagnosis Date  . Bipolar 1 disorder   . Hypertension   . Diabetes mellitus without complication     Consultants: Nephrology, vascular surgery  Procedures:  Hemodialysis catheter placement  Plan is for AV fistula versus graft on August 26  Antibiotics: None  Subjective: Patient feels well. Denies any complaints. Tolerated dialysis yesterday without any problems.   Objective: Vital Signs  Filed Vitals:   06/01/15 2330 06/01/15 2353 06/02/15 0500 06/02/15 0728  BP: 156/91 167/85 169/89 157/79  Pulse: 82 80 77 84  Temp: 98 F (36.7 C) 98.6 F (37 C) 98.9 F (37.2 C) 99.1 F (37.3 C)  TempSrc: Oral   Oral  Resp: 15 19 16 17   Height:      Weight:  53.8 kg (118 lb 9.7 oz)    SpO2:  98% 97% 97%    Intake/Output Summary (Last 24 hours) at 06/02/15 0833 Last data filed at 06/01/15 2330  Gross per 24 hour  Intake   1590 ml  Output   3000 ml  Net  -1410 ml   Filed Weights   05/31/15 1636 06/01/15 2035 06/01/15 2353  Weight: 54.7 kg (120 lb 9.5 oz) 56.7 kg (125 lb) 53.8 kg (118 lb 9.7 oz)    General appearance: alert, cooperative, appears stated age and no distress Resp: clear to auscultation bilaterally Cardio: regular rate and rhythm, S1, S2 normal, no murmur, click, rub or gallop GI: soft, non-tender; bowel sounds normal; no masses,  no organomegaly Neurologic: No focal deficits  Lab Results:  Basic Metabolic Panel:  Recent Labs Lab 05/28/15 2203 05/29/15 0545 05/30/15 0819 05/31/15 0612 06/01/15 0508 06/02/15 0704  NA  --  142  144 141 138 137  K  --  3.4* 3.7 3.7 2.9* 2.7*  CL  --  108 115* 115* 108 103  CO2  --  11* 11* 10* 19* 25  GLUCOSE  --  145* 98 157* 94 90  BUN  --  134* 123* 114* 62* 31*  CREATININE  --  16.72* 14.88* 14.31* 9.00* 5.37*  CALCIUM  --  6.3* 6.1* 6.5* 6.8* 6.8*  MG 1.8  --   --   --   --   --   PHOS 13.8*  --  10.9* 10.6* 5.8*  --    Liver Function Tests:  Recent Labs Lab 05/28/15 2203 05/29/15 0545 05/30/15 0819 05/31/15 0612 06/01/15 0508  AST 9* 10*  --   --   --   ALT 9* 9*  --   --   --   ALKPHOS 63 59  --   --   --   BILITOT 0.5 0.7  --   --   --   PROT 6.8 6.1*  --   --   --   ALBUMIN 3.6 3.5 3.1* 3.2* 2.8*   CBC:  Recent Labs Lab 05/29/15 0545 05/29/15 1617 05/30/15 0512 05/31/15 1416 06/01/15 0508 06/02/15 0704  WBC 10.4  --  11.9* 11.1* 10.9* 10.5  NEUTROABS  --   --  7.7  --   --   --   HGB 6.7* 7.4*  7.6* 7.1* 6.3* 8.8*  HCT 20.6* 22.2* 23.4* 21.5* 19.4* 26.3*  MCV 79.5  --  79.3 80.5 80.2 82.4  PLT 267  --  272 236 121* PENDING    Cardiac Enzymes:  Recent Labs Lab 05/28/15 2203  CKTOTAL 330*    CBG:  Recent Labs Lab 06/01/15 0831 06/01/15 1132 06/01/15 1727 06/01/15 1958 06/02/15 0725  GLUCAP 113* 157* 102* 140* 98    Recent Results (from the past 240 hour(s))  Urine culture     Status: None   Collection Time: 05/28/15  6:15 PM  Result Value Ref Range Status   Specimen Description URINE, RANDOM  Final   Special Requests NONE  Final   Culture MULTIPLE SPECIES PRESENT, SUGGEST RECOLLECTION  Final   Report Status 05/30/2015 FINAL  Final  C difficile quick scan w PCR reflex     Status: None   Collection Time: 05/29/15  9:10 AM  Result Value Ref Range Status   C Diff antigen NEGATIVE NEGATIVE Final   C Diff toxin NEGATIVE NEGATIVE Final   C Diff interpretation Negative for toxigenic C. difficile  Final      Studies/Results: No results found.  Medications:  Scheduled: . sodium chloride   Intravenous Once  . amLODipine  5 mg  Oral Daily  . calcitRIOL  0.25 mcg Oral Daily  . calcium acetate  1,334 mg Oral TID WC  . docusate sodium  100 mg Oral BID  . feeding supplement (NEPRO CARB STEADY)  237 mL Oral BID BM  . insulin aspart  0-5 Units Subcutaneous QHS  . insulin aspart  0-9 Units Subcutaneous TID WC  . multivitamin  1 tablet Oral QHS  . nicotine  21 mg Transdermal Daily  . sodium chloride  3 mL Intravenous Q12H   Continuous:  ZOX:[id number] **OR** acetaminophen, alum & mag hydroxide-simeth, hydrALAZINE, ondansetron **OR** ondansetron (ZOFRAN) IV, senna-docusate  Assessment/Plan:  Principal Problem:   ARF (acute renal failure) Active Problems:   Hypertensive urgency   Hypokalemia   Weight loss   DM2 (diabetes mellitus, type 2)   Anemia   ESRD needing dialysis   Malnutrition of moderate degree    ARF (acute renal failure) - Nephrology is following. Dialysis catheter was placed. Initially, patient declined dialysis, but then underwent dialysis. Further plans per nephrology. Vascular surgery also following for a more permanent access placement. This is planned for tomorrow. - Renal ultrasound reports no evidence of hydronephrosis  Hypertensive urgency - resolved. Continue amlodipine. Further blood pressure management per nephrology. - Hydralazine on board prn  Hypokalemia - To be addressed by nephrology  DM2 (diabetes mellitus, type 2) - Currently on SSI. HbA1c was 6.0. Patient was on insulin 70/30 at home. Consider starting basal insulin low doses depending on blood sugars over the next day or so.  Anemia likely secondary to chronic disease - Most likely due to Renal failure - No evidence for bleeding. She was transfused 2 units of blood on August 24. Hemoglobin has responded appropriately.   Thrombocytopenia Sudden drop in platelet count was noted. She was on subcutaneous heparin which was discontinued. Platelet counts are lower today compared to yesterday. Proceed with checking HIT  panel. Continue to monitor counts.  DVT Prophylaxis: On SCDs now    Code Status: Full code  Family Communication: Discussed with the patient  Disposition Plan: Continue management as outlined above.     LOS: 5 days   [facility]  [facility] Pager [phone] 06/02/2015, 8:33 AM  If 7PM-7AM, please  contact night-coverage at [email or web address], password [email or web address]

## 2015-06-02 NOTE — Progress Notes (Signed)
Patient ID: Jane Higgins, female   DOB: 05-06-1956, 59 y.o.   MRN: [id number]   [facility] Progress Note    Subjective:   Feels well today   Objective:   BP 157/79 mmHg  Pulse 84  Temp(Src) 99.1 F (37.3 C) (Oral)  Resp 17  Ht  (1.651 m)  Wt 53.8 kg (118 lb 9.7 oz)  BMI 19.74 kg/m2  SpO2 97%  Intake/Output: I/O last 3 completed shifts: In: 1950 [P.O.:1280; Blood:670] Out: 3100 [Urine:100; Other:3000]   Intake/Output this shift:    Weight change: 2 kg (4 lb 6.5 oz)  Physical Exam: Gen:WD WN AAF in NAD CVS:no rub Resp:cta WGN:FAOZHY Ext:no edema  Labs: BMET  Recent Labs Lab 05/28/15 1526 05/28/15 1642 05/28/15 2203 05/29/15 0545 05/30/15 0819 05/31/15 0612 06/01/15 0508 06/02/15 0704  NA 142 142  --  142 144 141 138 137  K 2.9* 2.8*  --  3.4* 3.7 3.7 2.9* 2.7*  CL 105 109  --  108 115* 115* 108 103  CO2 12*  --   --  11* 11* 10* 19* 25  GLUCOSE 213* 224*  --  145* 98 157* 94 90  BUN 136* 137*  --  134* 123* 114* 62* 31*  CREATININE 16.97* >18.00*  --  16.72* 14.88* 14.31* 9.00* 5.37*  ALBUMIN  --   --  3.6 3.5 3.1* 3.2* 2.8*  --   CALCIUM 6.5*  --   --  6.3* 6.1* 6.5* 6.8* 6.8*  PHOS  --   --  13.8*  --  10.9* 10.6* 5.8*  --    CBC  Recent Labs Lab 05/30/15 0512 05/31/15 1416 06/01/15 0508 06/02/15 0704  WBC 11.9* 11.1* 10.9* 10.5  NEUTROABS 7.7  --   --   --   HGB 7.6* 7.1* 6.3* 8.8*  HCT 23.4* 21.5* 19.4* 26.3*  MCV 79.3 80.5 80.2 82.4  PLT 272 236 121* 80*    @ Medications:    . sodium chloride   Intravenous Once  . amLODipine  5 mg Oral Daily  . calcitRIOL  0.25 mcg Oral Daily  . calcium acetate  1,334 mg Oral TID WC  . [START ON 06/03/2015] cefUROXime (ZINACEF)  IV  1.5 g Intravenous To SS-Surg  . docusate sodium  100 mg Oral BID  . feeding supplement (NEPRO CARB STEADY)  237 mL Oral BID BM  . insulin aspart  0-5 Units Subcutaneous QHS  . insulin aspart  0-9 Units Subcutaneous TID WC  .  multivitamin  1 tablet Oral QHS  . nicotine  21 mg Transdermal Daily  . sodium chloride  3 mL Intravenous Q12H     Assessment/ Plan:   1. Vascular access- for AVF/AVG tomorrow 2. ESRD for 3rd HD treatment today, for outpt HD pending placement 3. 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  Will change to hectoral with HD and stop calcitriol.  Continue to follow ca/phos/iPTH levels. 5. Nutrition: cont with renal diet 6. Hypokalemia- will use added K with HD and check magnesium level. 7. Hypertension:stable 8. Disposition- likely will be on TTS schedule once we can arrange for outpt HD.  Marshun Duva A 06/02/2015, 9:36 AM

## 2015-06-03 ENCOUNTER — Encounter (HOSPITAL_COMMUNITY)
Admission: EM | Disposition: A | Discharge status: Home / Self Care | Payer: Self-pay | Source: Home / Self Care | Attending: Family Medicine

## 2015-06-03 ENCOUNTER — Inpatient Hospital Stay (HOSPITAL_COMMUNITY): Payer: Medicare Other | Admitting: Anesthesiology

## 2015-06-03 ENCOUNTER — Encounter (HOSPITAL_COMMUNITY): Payer: Self-pay | Admitting: Anesthesiology

## 2015-06-03 HISTORY — PX: AV FISTULA PLACEMENT: SHX1204

## 2015-06-03 LAB — CBC
HCT: 26.7 % — ABNORMAL LOW (ref 36.0–46.0)
HCT: 27.2 % — ABNORMAL LOW (ref 36.0–46.0)
HEMOGLOBIN: 8.8 g/dL — AB (ref 12.0–15.0)
Hemoglobin: 8.7 g/dL — ABNORMAL LOW (ref 12.0–15.0)
MCH: 27.5 pg (ref 26.0–34.0)
MCH: 27.7 pg (ref 26.0–34.0)
MCHC: 32.6 g/dL (ref 30.0–36.0)
MCHC: 32.4 g/dL (ref 30.0–36.0)
MCV: 84.5 fL (ref 78.0–100.0)
MCV: 85.5 fL (ref 78.0–100.0)
PLATELETS: 109 10*3/uL — AB (ref 150–400)
PLATELETS: 75 10*3/uL — AB (ref 150–400)
RBC: 3.16 MIL/uL — ABNORMAL LOW (ref 3.87–5.11)
RBC: 3.18 MIL/uL — AB (ref 3.87–5.11)
RDW: 14.8 % (ref 11.5–15.5)
RDW: 14.7 % (ref 11.5–15.5)
WBC: 9.9 10*3/uL (ref 4.0–10.5)
WBC: 10.6 10*3/uL — AB (ref 4.0–10.5)

## 2015-06-03 LAB — GLUCOSE, CAPILLARY
GLUCOSE-CAPILLARY: 109 mg/dL — AB (ref 65–99)
GLUCOSE-CAPILLARY: 98 mg/dL (ref 65–99)
GLUCOSE-CAPILLARY: 210 mg/dL — AB (ref 65–99)
Glucose-Capillary: 107 mg/dL — ABNORMAL HIGH (ref 65–99)
Glucose-Capillary: 181 mg/dL — ABNORMAL HIGH (ref 65–99)

## 2015-06-03 LAB — RENAL FUNCTION PANEL
ALBUMIN: 2.7 g/dL — AB (ref 3.5–5.0)
ANION GAP: 6 (ref 5–15)
BUN: 24 mg/dL — ABNORMAL HIGH (ref 6–20)
CALCIUM: 7.1 mg/dL — AB (ref 8.9–10.3)
CO2: 27 mmol/L (ref 22–32)
CREATININE: 3.96 mg/dL — AB (ref 0.44–1.00)
Chloride: 101 mmol/L (ref 101–111)
GFR, EST AFRICAN AMERICAN: 13 mL/min — AB (ref >60)
GFR, EST NON AFRICAN AMERICAN: 11 mL/min — AB (ref >60)
Glucose, Bld: 196 mg/dL — ABNORMAL HIGH (ref 65–99)
PHOSPHORUS: 3.9 mg/dL (ref 2.5–4.6)
Potassium: 3.4 mmol/L — ABNORMAL LOW (ref 3.5–5.1)
SODIUM: 134 mmol/L — AB (ref 135–145)

## 2015-06-03 LAB — BASIC METABOLIC PANEL
ANION GAP: 7 (ref 5–15)
BUN: 14 mg/dL (ref 6–20)
CALCIUM: 6.8 mg/dL — AB (ref 8.9–10.3)
CO2: 28 mmol/L (ref 22–32)
CREATININE: 3.24 mg/dL — AB (ref 0.44–1.00)
Chloride: 98 mmol/L — ABNORMAL LOW (ref 101–111)
GFR, EST AFRICAN AMERICAN: 17 mL/min — AB (ref >60)
GFR, EST NON AFRICAN AMERICAN: 14 mL/min — AB (ref >60)
GLUCOSE: 93 mg/dL (ref 65–99)
Potassium: 2.9 mmol/L — ABNORMAL LOW (ref 3.5–5.1)
Sodium: 133 mmol/L — ABNORMAL LOW (ref 135–145)

## 2015-06-03 LAB — VITAMIN D 1,25 DIHYDROXY
Vitamin D2 1, 25 (OH)2: <10 pg/mL
Vitamin D3 1, 25 (OH)2: <10 pg/mL

## 2015-06-03 SURGERY — ARTERIOVENOUS (AV) FISTULA CREATION
Anesthesia: Monitor Anesthesia Care | Site: Arm Lower | Laterality: Right

## 2015-06-03 MED ORDER — MAGNESIUM OXIDE 400 (241.3 MG) MG PO TABS
400.0000 mg | ORAL_TABLET | Freq: Once | ORAL | Status: AC
  Administered 2015-06-03: 400 mg via ORAL
  Filled 2015-06-03: qty 1

## 2015-06-03 MED ORDER — LIDOCAINE-EPINEPHRINE (PF) 1 %-1:200000 IJ SOLN
INTRAMUSCULAR | Status: AC
  Filled 2015-06-03: qty 30

## 2015-06-03 MED ORDER — HEPARIN SODIUM (PORCINE) 1000 UNIT/ML DIALYSIS: 20.0000 [IU]/kg | INTRAMUSCULAR | Status: DC | PRN

## 2015-06-03 MED ORDER — LIDOCAINE HCL (PF) 1 % IJ SOLN: 5.0000 mL | INTRAMUSCULAR | Status: DC | PRN

## 2015-06-03 MED ORDER — FENTANYL CITRATE (PF) 250 MCG/5ML IJ SOLN
INTRAMUSCULAR | Status: AC
  Filled 2015-06-03: qty 5

## 2015-06-03 MED ORDER — SODIUM CHLORIDE 0.9 % IV SOLN: INTRAVENOUS | Status: DC

## 2015-06-03 MED ORDER — NEPRO/CARBSTEADY PO LIQD: 237.0000 mL | ORAL | Status: DC | PRN

## 2015-06-03 MED ORDER — POTASSIUM CHLORIDE CRYS ER 20 MEQ PO TBCR
40.0000 meq | EXTENDED_RELEASE_TABLET | Freq: Once | ORAL | Status: AC
  Administered 2015-06-03: 40 meq via ORAL
  Filled 2015-06-03: qty 2

## 2015-06-03 MED ORDER — ONDANSETRON HCL 4 MG/2ML IJ SOLN
INTRAMUSCULAR | Status: DC | PRN
  Administered 2015-06-03: 4 mg via INTRAVENOUS

## 2015-06-03 MED ORDER — MIDAZOLAM HCL 2 MG/2ML IJ SOLN
INTRAMUSCULAR | Status: AC
  Filled 2015-06-03: qty 4

## 2015-06-03 MED ORDER — PROPOFOL INFUSION 10 MG/ML OPTIME
INTRAVENOUS | Status: DC | PRN
  Administered 2015-06-03: 10 ug/kg/min via INTRAVENOUS

## 2015-06-03 MED ORDER — ALTEPLASE 2 MG IJ SOLR
2.0000 mg | Freq: Once | INTRAMUSCULAR | Status: DC | PRN
  Filled 2015-06-03: qty 2

## 2015-06-03 MED ORDER — MIDAZOLAM HCL 5 MG/5ML IJ SOLN
INTRAMUSCULAR | Status: DC | PRN
  Administered 2015-06-03 (×2): 1 mg via INTRAVENOUS

## 2015-06-03 MED ORDER — 0.9 % SODIUM CHLORIDE (POUR BTL) OPTIME
TOPICAL | Status: DC | PRN
  Administered 2015-06-03: 1000 mL

## 2015-06-03 MED ORDER — LIDOCAINE-EPINEPHRINE 0.5 %-1:200000 IJ SOLN
INTRAMUSCULAR | Status: DC | PRN
  Administered 2015-06-03: 5 mL

## 2015-06-03 MED ORDER — LIDOCAINE-PRILOCAINE 2.5-2.5 % EX CREA
1.0000 "application " | TOPICAL_CREAM | CUTANEOUS | Status: DC | PRN
  Filled 2015-06-03: qty 5

## 2015-06-03 MED ORDER — SODIUM CHLORIDE 0.9 % IR SOLN
Status: DC | PRN
  Administered 2015-06-03: 500 mL

## 2015-06-03 MED ORDER — SODIUM CHLORIDE 0.9 % IV SOLN: 100.0000 mL | INTRAVENOUS | Status: DC | PRN

## 2015-06-03 MED ORDER — LIDOCAINE-EPINEPHRINE 0.5 %-1:200000 IJ SOLN
INTRAMUSCULAR | Status: AC
  Filled 2015-06-03: qty 1

## 2015-06-03 MED ORDER — OXYCODONE-ACETAMINOPHEN 5-325 MG PO TABS
1.0000 | ORAL_TABLET | ORAL | Status: DC | PRN
  Administered 2015-06-03: 1 via ORAL
  Filled 2015-06-03: qty 1

## 2015-06-03 MED ORDER — HEPARIN SODIUM (PORCINE) 1000 UNIT/ML DIALYSIS: 1000.0000 [IU] | INTRAMUSCULAR | Status: DC | PRN

## 2015-06-03 MED ORDER — FENTANYL CITRATE (PF) 100 MCG/2ML IJ SOLN
INTRAMUSCULAR | Status: DC | PRN
  Administered 2015-06-03 (×2): 50 ug via INTRAVENOUS

## 2015-06-03 MED ORDER — PENTAFLUOROPROP-TETRAFLUOROETH EX AERO: 1.0000 "application " | INHALATION_SPRAY | CUTANEOUS | Status: DC | PRN

## 2015-06-03 MED ORDER — SODIUM CHLORIDE 0.9 % IV SOLN
INTRAVENOUS | Status: DC | PRN
  Administered 2015-06-03: 12:00:00 via INTRAVENOUS

## 2015-06-03 SURGICAL SUPPLY — 32 items
ARMBAND PINK RESTRICT EXTREMIT (MISCELLANEOUS) ×3 IMPLANT
BENZOIN TINCTURE PRP APPL 2/3 (GAUZE/BANDAGES/DRESSINGS) ×3 IMPLANT
CANISTER SUCTION 2500CC (MISCELLANEOUS) ×3 IMPLANT
CANNULA VESSEL 3MM 2 BLNT TIP (CANNULA) IMPLANT
CLIP LIGATING EXTRA MED SLVR (CLIP) ×3 IMPLANT
CLIP LIGATING EXTRA SM BLUE (MISCELLANEOUS) ×3 IMPLANT
CLOSURE STERI-STRIP 1/2X4 (GAUZE/BANDAGES/DRESSINGS) ×1
CLOSURE WOUND 1/2 X4 (GAUZE/BANDAGES/DRESSINGS) ×1
CLSR STERI-STRIP ANTIMIC 1/2X4 (GAUZE/BANDAGES/DRESSINGS) ×2 IMPLANT
COVER PROBE W GEL 5X96 (DRAPES) IMPLANT
DECANTER SPIKE VIAL GLASS SM (MISCELLANEOUS) ×3 IMPLANT
ELECT REM PT RETURN 9FT ADLT (ELECTROSURGICAL) ×3
ELECTRODE REM PT RTRN 9FT ADLT (ELECTROSURGICAL) ×1 IMPLANT
GAUZE SPONGE 4X4 12PLY STRL (GAUZE/BANDAGES/DRESSINGS) ×3 IMPLANT
GEL ULTRASOUND 20GR AQUASONIC (MISCELLANEOUS) IMPLANT
GLOVE SS BIOGEL STRL SZ 7.5 (GLOVE) ×1 IMPLANT
GLOVE SUPERSENSE BIOGEL SZ 7.5 (GLOVE) ×2
GOWN STRL REUS W/ TWL LRG LVL3 (GOWN DISPOSABLE) ×3 IMPLANT
GOWN STRL REUS W/TWL LRG LVL3 (GOWN DISPOSABLE) ×9
KIT BASIN OR (CUSTOM PROCEDURE TRAY) ×3 IMPLANT
KIT ROOM TURNOVER OR (KITS) ×3 IMPLANT
NS IRRIG 1000ML POUR BTL (IV SOLUTION) ×3 IMPLANT
PACK CV ACCESS (CUSTOM PROCEDURE TRAY) ×3 IMPLANT
PAD ARMBOARD 7.5X6 YLW CONV (MISCELLANEOUS) ×6 IMPLANT
SPONGE GAUZE 4X4 12PLY STER LF (GAUZE/BANDAGES/DRESSINGS) ×3 IMPLANT
STRIP CLOSURE SKIN 1/2X4 (GAUZE/BANDAGES/DRESSINGS) ×2 IMPLANT
SUT PROLENE 6 0 CC (SUTURE) ×3 IMPLANT
SUT VIC AB 3-0 SH 27 (SUTURE) ×2
SUT VIC AB 3-0 SH 27X BRD (SUTURE) ×1 IMPLANT
TAPE CLOTH SURG 4X10 WHT LF (GAUZE/BANDAGES/DRESSINGS) ×3 IMPLANT
UNDERPAD 30X30 INCONTINENT (UNDERPADS AND DIAPERS) ×3 IMPLANT
WATER STERILE IRR 1000ML POUR (IV SOLUTION) ×3 IMPLANT

## 2015-06-03 NOTE — Interval H&P Note (Signed)
History and Physical Interval Note:  06/03/2015 12:17 PM  Jane Higgins  has presented today for surgery, with the diagnosis of End Stage Renal Diseae N18.6  The various methods of treatment have been discussed with the patient and family. After consideration of risks, benefits and other options for treatment, the patient has consented to  Procedure(s): ARTERIOVENOUS (AV) FISTULA CREATION VERUS GRAFT INSERTION (Right) as a surgical intervention .  The patient's history has been reviewed, patient examined, no change in status, stable for surgery.  I have reviewed the patient's chart and labs.  Questions were answered to the patient's satisfaction.     Gretta Began

## 2015-06-03 NOTE — Progress Notes (Signed)
[facility] PROGRESS NOTE  Jane Higgins JXB:[id number] DOB: [birth year] DOA: 05/28/2015  PCP: No primary care provider on file.  Brief HPI: 59 year old African-American female with a past medical history of bipolar disorder, hypertension, diabetes, presented with acute kidney injury. Nephrology was consulted. Dialysis access was obtained. She was started on dialysis.   Past medical history:  Past Medical History  Diagnosis Date  . Bipolar 1 disorder   . Hypertension   . Diabetes mellitus without complication     Consultants: Nephrology, vascular surgery  Procedures:  Hemodialysis catheter placement  Plan is for AV fistula versus graft on August 26  Antibiotics: None  Subjective: Patient feels well. Denies any complaints. Tolerated dialysis yesterday without any problems.   Objective: Vital Signs  Filed Vitals:   06/03/15 0410 06/03/15 0920 06/03/15 1349 06/03/15 1400  BP: 180/88 158/78 155/76   Pulse: 83 86 107   Temp: 97.9 F (36.6 C) 98.2 F (36.8 C) 97.9 F (36.6 C) 97.9 F (36.6 C)  TempSrc: Oral Oral    Resp: 19 18 19    Height:      Weight:      SpO2: 98% 96% 97%     Intake/Output Summary (Last 24 hours) at 06/03/15 1423 Last data filed at 06/03/15 1342  Gross per 24 hour  Intake    340 ml  Output   1010 ml  Net   -670 ml   Filed Weights   06/02/15 1554 06/02/15 1930 06/02/15 2008  Weight: 55.7 kg (122 lb 12.7 oz) 53.8 kg (118 lb 9.7 oz) 54.8 kg (120 lb 13 oz)    General appearance: alert, cooperative, appears stated age and no distress Resp: clear to auscultation bilaterally Cardio: regular rate and rhythm, S1, S2 normal, no murmur, click, rub or gallop GI: soft, non-tender; bowel sounds normal; no masses,  no organomegaly Neurologic: No focal deficits  Lab Results:  Basic Metabolic Panel:  Recent Labs Lab 05/28/15 2203  05/30/15 0819 05/31/15 0612 06/01/15 0508 06/02/15 0704 06/03/15 0555  NA  --   < > 144 141 138 137  133*  K  --   < > 3.7 3.7 2.9* 2.7* 2.9*  CL  --   < > 115* 115* 108 103 98*  CO2  --   < > 11* 10* 19* 25 28  GLUCOSE  --   < > 98 157* 94 90 93  BUN  --   < > 123* 114* 62* 31* 14  CREATININE  --   < > 14.88* 14.31* 9.00* 5.37* 3.24*  CALCIUM  --   < > 6.1* 6.5* 6.8* 6.8* 6.8*  MG 1.8  --   --   --   --  1.6*  --   PHOS 13.8*  --  10.9* 10.6* 5.8*  --   --   < > = values in this interval not displayed. Liver Function Tests:  Recent Labs Lab 05/28/15 2203 05/29/15 0545 05/30/15 0819 05/31/15 0612 06/01/15 0508  AST 9* 10*  --   --   --   ALT 9* 9*  --   --   --   ALKPHOS 63 59  --   --   --   BILITOT 0.5 0.7  --   --   --   PROT 6.8 6.1*  --   --   --   ALBUMIN 3.6 3.5 3.1* 3.2* 2.8*   CBC:  Recent Labs Lab 05/30/15 1308 05/31/15 1416 06/01/15 0508 06/02/15 6578  06/03/15 0555  WBC 11.9* 11.1* 10.9* 10.5 9.9  NEUTROABS 7.7  --   --   --   --   HGB 7.6* 7.1* 6.3* 8.8* 8.7*  HCT 23.4* 21.5* 19.4* 26.3* 26.7*  MCV 79.3 80.5 80.2 82.4 84.5  PLT 272 236 121* 80* 109*    Cardiac Enzymes:  Recent Labs Lab 05/28/15 2203  CKTOTAL 330*    CBG:  Recent Labs Lab 06/02/15 1125 06/02/15 2007 06/03/15 0740 06/03/15 1037 06/03/15 1349  GLUCAP 164* 90 107* 109* 98    Recent Results (from the past 240 hour(s))  Urine culture     Status: None   Collection Time: 05/28/15  6:15 PM  Result Value Ref Range Status   Specimen Description URINE, RANDOM  Final   Special Requests NONE  Final   Culture MULTIPLE SPECIES PRESENT, SUGGEST RECOLLECTION  Final   Report Status 05/30/2015 FINAL  Final  C difficile quick scan w PCR reflex     Status: None   Collection Time: 05/29/15  9:10 AM  Result Value Ref Range Status   C Diff antigen NEGATIVE NEGATIVE Final   C Diff toxin NEGATIVE NEGATIVE Final   C Diff interpretation Negative for toxigenic C. difficile  Final      Studies/Results: No results found.  Medications:  Scheduled: . sodium chloride   Intravenous Once    . amLODipine  5 mg Oral Daily  . calcium acetate  1,334 mg Oral TID WC  . darbepoetin (ARANESP) injection - DIALYSIS  60 mcg Intravenous Q Thu-HD  . docusate sodium  100 mg Oral BID  . doxercalciferol  4 mcg Intravenous Q T,Th,Sa-HD  . feeding supplement (NEPRO CARB STEADY)  237 mL Oral BID BM  . insulin aspart  0-5 Units Subcutaneous QHS  . insulin aspart  0-9 Units Subcutaneous TID WC  . multivitamin  1 tablet Oral QHS  . nicotine  21 mg Transdermal Daily  . sodium chloride  3 mL Intravenous Q12H   Continuous: . sodium chloride     ZOX:[id number] **OR** acetaminophen, alum & mag hydroxide-simeth, hydrALAZINE, ondansetron **OR** ondansetron (ZOFRAN) IV, oxyCODONE-acetaminophen, senna-docusate  Assessment/Plan:  Principal Problem:   ARF (acute renal failure) Active Problems:   Hypertensive urgency   Hypokalemia   Weight loss   DM2 (diabetes mellitus, type 2)   Anemia   ESRD needing dialysis   Malnutrition of moderate degree    ARF (acute renal failure) - Nephrology is following. Dialysis catheter was placed. Initially, patient declined dialysis, but then underwent dialysis. Further plans per nephrology. Vascular surgery performed Right upper arm brachiocephalic AV fistula creation on 8/26  - Renal ultrasound reports no evidence of hydronephrosis  Hypertensive urgency - resolved. Continue amlodipine. Further blood pressure management per nephrology. - Hydralazine on board prn  Hypokalemia Repleted  DM2 (diabetes mellitus, type 2) - Currently on SSI. HbA1c was 6.0. Patient was on insulin 70/30 at home. CBG controlled  Anemia likely secondary to chronic disease - Most likely due to Renal failure - No evidence for bleeding. She was transfused 2 units of blood on August 24. Hemoglobin has responded appropriately.   Thrombocytopenia Sudden drop in platelet count was noted. She was on subcutaneous heparin which was discontinued. Platelet counts are lower today  compared to yesterday.   HIT panel pending at this time. Continue to monitor counts.  DVT Prophylaxis: On SCDs now    Code Status: Full code  Family Communication: Discussed with the patient  Disposition Plan: Patient needs to  be set up for outpatient dialysis    LOS: 6 days   [facility]  [facility] Pager [phone]  06/03/2015, 2:23 PM  If 7PM-7AM, please contact night-coverage at [email or web address], password [facility]

## 2015-06-03 NOTE — Interval H&P Note (Signed)
History and Physical Interval Note:  06/03/2015 12:08 PM  Jane Higgins  has presented today for surgery, with the diagnosis of End Stage Renal Diseae N18.6  The various methods of treatment have been discussed with the patient and family. After consideration of risks, benefits and other options for treatment, the patient has consented to  Procedure(s): ARTERIOVENOUS (AV) FISTULA CREATION VERUS GRAFT INSERTION (Right) as a surgical intervention .  The patient's history has been reviewed, patient examined, no change in status, stable for surgery.  I have reviewed the patient's chart and labs.  Questions were answered to the patient's satisfaction.     Gretta Began

## 2015-06-03 NOTE — Progress Notes (Signed)
Patient ID: Jane Higgins, female   DOB: [birth year], 59 y.o.   MRN: [id number]  [facility] Progress Note    Subjective:   No new complaints   Objective:   BP 180/88 mmHg  Pulse 83  Temp(Src) 97.9 F (36.6 C) (Oral)  Resp 19  Ht  (1.651 m)  Wt 54.8 kg (120 lb 13 oz)  BMI 20.10 kg/m2  SpO2 98%  Intake/Output: I/O last 3 completed shifts: In: 1807 [P.O.:1137; Blood:670] Out: 4200 [Urine:200; Other:4000]   Intake/Output this shift:    Weight change: -1 kg (-2 lb 3.3 oz)  Physical Exam: Gen: thin AAF in NAD CVS:no rub Resp:cta NWG:NFAOZH Ext:no edema  Labs: BMET  Recent Labs Lab 05/28/15 1526 05/28/15 1642 05/28/15 2203 05/29/15 0545 05/30/15 0819 05/31/15 0612 06/01/15 0508 06/02/15 0704 06/03/15 0555  NA 142 142  --  142 144 141 138 137 133*  K 2.9* 2.8*  --  3.4* 3.7 3.7 2.9* 2.7* 2.9*  CL 105 109  --  108 115* 115* 108 103 98*  CO2 12*  --   --  11* 11* 10* 19* 25 28  GLUCOSE 213* 224*  --  145* 98 157* 94 90 93  BUN 136* 137*  --  134* 123* 114* 62* 31* 14  CREATININE 16.97* >18.00*  --  16.72* 14.88* 14.31* 9.00* 5.37* 3.24*  ALBUMIN  --   --  3.6 3.5 3.1* 3.2* 2.8*  --   --   CALCIUM 6.5*  --   --  6.3* 6.1* 6.5* 6.8* 6.8* 6.8*  PHOS  --   --  13.8*  --  10.9* 10.6* 5.8*  --   --    CBC  Recent Labs Lab 05/30/15 0512 05/31/15 1416 06/01/15 0508 06/02/15 0704 06/03/15 0555  WBC 11.9* 11.1* 10.9* 10.5 9.9  NEUTROABS 7.7  --   --   --   --   HGB 7.6* 7.1* 6.3* 8.8* 8.7*  HCT 23.4* 21.5* 19.4* 26.3* 26.7*  MCV 79.3 80.5 80.2 82.4 84.5  PLT 272 236 121* 80* PENDING    @ Medications:    . sodium chloride   Intravenous Once  . amLODipine  5 mg Oral Daily  . calcium acetate  1,334 mg Oral TID WC  . cefUROXime (ZINACEF)  IV  1.5 g Intravenous To SS-Surg  . darbepoetin (ARANESP) injection - DIALYSIS  60 mcg Intravenous Q Thu-HD  . docusate sodium  100 mg Oral BID  . doxercalciferol  4 mcg Intravenous Q  T,Th,Sa-HD  . feeding supplement (NEPRO CARB STEADY)  237 mL Oral BID BM  . insulin aspart  0-5 Units Subcutaneous QHS  . insulin aspart  0-9 Units Subcutaneous TID WC  . magnesium oxide  400 mg Oral Once  . multivitamin  1 tablet Oral QHS  . nicotine  21 mg Transdermal Daily  . sodium chloride  3 mL Intravenous Q12H     Assessment/ Plan:   1. Vascular access- for AVF/AVG today 2. ESRD for 3rd HD treatment today, for outpt HD pending placement 3. 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 Will change to hectoral with HD and stop calcitriol. Continue to follow ca/phos/iPTH levels. 5. Nutrition: cont with renal diet 6. Hypokalemia- will use added K with HD and check magnesium level. And replete with oral replacement. 7. Hypertension:stable 8. Disposition- likely will be on TTS schedule once we can arrange for outpt HD.  Melaina Howerton A 06/03/2015, 9:01 AM

## 2015-06-03 NOTE — Anesthesia Procedure Notes (Signed)
Procedure Name: MAC Date/Time: 06/03/2015 1:36 PM Performed by: Wray Kearns A Pre-anesthesia Checklist: Patient identified, Emergency Drugs available, Suction available, Patient being monitored and Timeout performed Patient Re-evaluated:Patient Re-evaluated prior to inductionOxygen Delivery Method: Nasal cannula Intubation Type: IV induction Placement Confirmation: positive ETCO2 Dental Injury: Teeth and Oropharynx as per pre-operative assessment

## 2015-06-03 NOTE — H&P (View-Only) (Signed)
Patient ID: Jane Higgins, female   DOB: 05/04/1956, 59 y.o.   MRN: [id number] Patient has tolerated hemodialysis via her catheter. Again discussed need for long-term access. IV sedation removed from right arm. Antecubital vein is patent. Will plan surgery on Friday with right arm AV fistula versus AV graft 

## 2015-06-03 NOTE — Transfer of Care (Signed)
Immediate Anesthesia Transfer of Care Note  Patient: Jane Higgins  Procedure(s) Performed: Procedure(s): ARTERIOVENOUS (AV) FISTULA CREATION  (Right)  Patient Location: PACU  Anesthesia Type:MAC  Level of Consciousness: awake, oriented, sedated, patient cooperative and responds to stimulation  Airway & Oxygen Therapy: Patient Spontanous Breathing  Post-op Assessment: Report given to RN, Post -op Vital signs reviewed and stable, Patient moving all extremities and Patient moving all extremities X 4  Post vital signs: Reviewed and stable  Last Vitals:  Filed Vitals:   06/03/15 0920  BP: 158/78  Pulse: 86  Temp: 36.8 C  Resp: 18    Complications: No apparent anesthesia complications

## 2015-06-03 NOTE — Op Note (Signed)
    OPERATIVE REPORT  DATE OF SURGERY: 06/03/2015  PATIENT: Jane Higgins, 59 y.o. female MRN: [id number]  DOB: Mar 13, 1956  PRE-OPERATIVE DIAGNOSIS: End-stage renal disease  POST-OPERATIVE DIAGNOSIS:  Same  PROCEDURE: Right upper arm brachiocephalic AV fistula creation  SURGEON:  Gretta Began, M.D.  PHYSICIAN ASSISTANT: Collins  ANESTHESIA:  Local with sedation  EBL: Minimal ml  Total I/O In: 250 [I.V.:250] Out: 0   BLOOD ADMINISTERED: None  DRAINS: None  SPECIMEN: None  COUNTS CORRECT:  YES  PLAN OF CARE: PACU   PATIENT DISPOSITION:  PACU - hemodynamically stable  PROCEDURE DETAILS: Patient segment replaced the appropriate sterile fashion. Patient's preoperative ultrasound suggested marginal size for cephalic vein fistula. On 9 better hydration patient had an excellent cephalic vein at the level of her wrist through the antecubital space and in her upper arm. She had a very sclerotic and calcified radial artery with a normal pulse. His daughter best option would be brachiocephalic fistula. Using local anesthesia incision was made across antecubital space ^ M isolate the cephalic vein. Tributary branches were ligated with 3 or 4 silk ties and divided. The vein was ligated distally and divided and mobilized to the level of the brachial artery. The brachial artery was of good size and had no atherosclerotic change. The artery was occluded proximally and distally and was opened with 11 blade and sent longitudinally with Potts scissors. A small arteriotomy was created. The vein was cut to appropriate length was slightly spatulated and sewn end-to-side to the artery with a running 60 proline suture. Clamps were removed and excellent thrill was noted throughout the vein. The wounds were irrigated with saline. Hemostasis tablet cautery. The wounds were closed with 3-0 Monocryl in the subcutaneous and subcuticular tissue. Benzoin insertion for applied.   Gretta Began,  M.D. 06/03/2015 1:48 PM

## 2015-06-03 NOTE — Anesthesia Preprocedure Evaluation (Addendum)
Anesthesia Evaluation  Patient identified by MRN, date of birth, ID band Patient awake    Reviewed: Allergy & Precautions, H&P , NPO status , Patient's Chart, lab work & pertinent test results  History of Anesthesia Complications Negative for: history of anesthetic complications  Airway Mallampati: IV  TM Distance: >3 FB Neck ROM: full    Dental  (+) Edentulous Upper   Pulmonary Current Smoker,  breath sounds clear to auscultation  Pulmonary exam normal       Cardiovascular hypertension, On Medications negative cardio ROS Normal cardiovascular examRhythm:regular Rate:Normal     Neuro/Psych Seizures -,  Bipolar Disorder Schizophrenia    GI/Hepatic negative GI ROS, Neg liver ROS,   Endo/Other  diabetes, Poorly Controlled, Insulin Dependent  Renal/GU Dialysis and ESRFRenal disease     Musculoskeletal   Abdominal   Peds  Hematology negative hematology ROS (+)   Anesthesia Other Findings NPO appropriate, denies cardiac or pulmonary symptoms  Reproductive/Obstetrics negative OB ROS                            Anesthesia Physical Anesthesia Plan  ASA: III  Anesthesia Plan: MAC   Post-op Pain Management:    Induction: Intravenous  Airway Management Planned: Simple Face Mask  Additional Equipment:   Intra-op Plan:   Post-operative Plan:   Informed Consent: I have reviewed the patients History and Physical, chart, labs and discussed the procedure including the risks, benefits and alternatives for the proposed anesthesia with the patient or authorized representative who has indicated his/her understanding and acceptance.     Plan Discussed with: Anesthesiologist, CRNA and Surgeon  Anesthesia Plan Comments:        Anesthesia Quick Evaluation

## 2015-06-04 LAB — CBC
HCT: 27.4 % — ABNORMAL LOW (ref 36.0–46.0)
HEMOGLOBIN: 8.8 g/dL — AB (ref 12.0–15.0)
MCH: 27.4 pg (ref 26.0–34.0)
MCHC: 32.1 g/dL (ref 30.0–36.0)
MCV: 85.4 fL (ref 78.0–100.0)
Platelets: 95 10*3/uL — ABNORMAL LOW (ref 150–400)
RBC: 3.21 MIL/uL — AB (ref 3.87–5.11)
RDW: 14.6 % (ref 11.5–15.5)
WBC: 10.2 10*3/uL (ref 4.0–10.5)

## 2015-06-04 LAB — GLUCOSE, CAPILLARY
GLUCOSE-CAPILLARY: 111 mg/dL — AB (ref 65–99)
GLUCOSE-CAPILLARY: 178 mg/dL — AB (ref 65–99)
GLUCOSE-CAPILLARY: 132 mg/dL — AB (ref 65–99)
Glucose-Capillary: 172 mg/dL — ABNORMAL HIGH (ref 65–99)
Glucose-Capillary: 145 mg/dL — ABNORMAL HIGH (ref 65–99)

## 2015-06-04 LAB — COMPREHENSIVE METABOLIC PANEL
ALT: 22 U/L (ref 14–54)
ANION GAP: 5 (ref 5–15)
AST: 32 U/L (ref 15–41)
Albumin: 2.7 g/dL — ABNORMAL LOW (ref 3.5–5.0)
Alkaline Phosphatase: 64 U/L (ref 38–126)
BUN: 9 mg/dL (ref 6–20)
CHLORIDE: 99 mmol/L — AB (ref 101–111)
CO2: 30 mmol/L (ref 22–32)
Calcium: 7.2 mg/dL — ABNORMAL LOW (ref 8.9–10.3)
Creatinine, Ser: 2.06 mg/dL — ABNORMAL HIGH (ref 0.44–1.00)
GFR calc non Af Amer: 25 mL/min — ABNORMAL LOW (ref >60)
GFR, EST AFRICAN AMERICAN: 29 mL/min — AB (ref >60)
Glucose, Bld: 182 mg/dL — ABNORMAL HIGH (ref 65–99)
POTASSIUM: 3.5 mmol/L (ref 3.5–5.1)
SODIUM: 134 mmol/L — AB (ref 135–145)
Total Bilirubin: 0.3 mg/dL (ref 0.3–1.2)
Total Protein: 5.5 g/dL — ABNORMAL LOW (ref 6.5–8.1)

## 2015-06-04 MED ORDER — DARBEPOETIN ALFA 60 MCG/0.3ML IJ SOSY
PREFILLED_SYRINGE | INTRAMUSCULAR | Status: AC
  Administered 2015-06-04: 60 ug via INTRAVENOUS
  Filled 2015-06-04: qty 0.3

## 2015-06-04 MED ORDER — DOXERCALCIFEROL 4 MCG/2ML IV SOLN
INTRAVENOUS | Status: AC
  Administered 2015-06-04: 4 ug via INTRAVENOUS
  Filled 2015-06-04: qty 2

## 2015-06-04 NOTE — Progress Notes (Addendum)
Patient ID: Jane Higgins, female   DOB: Jul 12, 1956, 59 y.o.   MRN: [id number]  [facility] Progress Note    Subjective:   Feels better   Objective:   BP 157/74 mmHg  Pulse 86  Temp(Src) 98.8 F (37.1 C) (Oral)  Resp 16  Ht  (1.651 m)  Wt 53.9 kg (118 lb 13.3 oz)  BMI 19.77 kg/m2  SpO2 97%  Intake/Output: I/O last 3 completed shifts: In: 1540 [P.O.:1440; I.V.:100] Out: 2535 [Urine:525; Other:2000; Blood:10]   Intake/Output this shift:    Weight change: -0.815 kg (-1 lb 12.7 oz)  Physical Exam: Gen:WD WN AAF in NAD CVS:no rub Resp:cta HQI:ONGEXB Ext:no edema, RUE AVF +T/B  Labs: BMET  Recent Labs Lab 05/28/15 2203 05/29/15 0545 05/30/15 2841 05/31/15 0612 06/01/15 0508 06/02/15 0704 06/03/15 0555 06/03/15 2233 06/04/15 0353  NA  --  142 144 141 138 137 133* 134* 134*  K  --  3.4* 3.7 3.7 2.9* 2.7* 2.9* 3.4* 3.5  CL  --  108 115* 115* 108 103 98* 101 99*  CO2  --  11* 11* 10* 19* GLUCOSE  --  145* 98 157* 94 90 93 196* 182*  BUN  --  134* 123* 114* 62* 31* 14 24* 9  CREATININE  --  16.72* 14.88* 14.31* 9.00* 5.37* 3.24* 3.96* 2.06*  ALBUMIN 3.6 3.5 3.1* 3.2* 2.8*  --   --  2.7* 2.7*  CALCIUM  --  6.3* 6.1* 6.5* 6.8* 6.8* 6.8* 7.1* 7.2*  PHOS 13.8*  --  10.9* 10.6* 5.8*  --   --  3.9  --    CBC  Recent Labs Lab 05/30/15 0512  06/02/15 0704 06/03/15 0555 06/03/15 2234 06/04/15 0353  WBC 11.9*  < > 10.5 9.9 10.6* 10.2  NEUTROABS 7.7  --   --   --   --   --   HGB 7.6*  < > 8.8* 8.7* 8.8* 8.8*  HCT 23.4*  < > 26.3* 26.7* 27.2* 27.4*  MCV 79.3  < > 82.4 84.5 85.5 85.4  PLT 272  < > 80* 109* 75* 95*  < > = values in this interval not displayed.  @ Medications:    . sodium chloride   Intravenous Once  . amLODipine  5 mg Oral Daily  . calcium acetate  1,334 mg Oral TID WC  . darbepoetin (ARANESP) injection - DIALYSIS  60 mcg Intravenous Q Thu-HD  . docusate sodium  100 mg Oral BID  .  doxercalciferol  4 mcg Intravenous Q T,Th,Sa-HD  . feeding supplement (NEPRO CARB STEADY)  237 mL Oral BID BM  . insulin aspart  0-5 Units Subcutaneous QHS  . insulin aspart  0-9 Units Subcutaneous TID WC  . multivitamin  1 tablet Oral QHS  . nicotine  21 mg Transdermal Daily  . sodium chloride  3 mL Intravenous Q12H   HD orders:  TTS at [facility], bfr400, dfr 800, time 3:45, EDW 53.5kg, std heparin, aranesp IV qTTS, no sodium modeling, RIJ TDC, new RUE AVF placed 06/03/15 by Dr. Arbie Cookey  Assessment/ Plan:   1. Vascular access- for AVF 06/03/15 by Dr. Arbie Cookey with good response 2. ESRD s/p 3rd HD treatment yesterday, set up for outpt HD TTS at [facility] and will need to go to the dialysis unit on Monday afternoon before her appointment time to sign papers 3. 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 Will change to hectoral with HD and stop calcitriol. Continue to follow ca/phos/iPTH levels. 5. Nutrition: cont with renal diet 6. Hypokalemia- will use added K with HD and check magnesium level. And replete with oral replacement. 7. Hypertension:stable 8. Disposition- she has been set up at [facility] on TTS schedule.  She will need to go there on Monday afternoon to sign papers and register for outpt HD.  Stable for discharge from our standpoint  Yanira Tolsma A 06/04/2015, 9:13 AM

## 2015-06-04 NOTE — Progress Notes (Signed)
[facility] PROGRESS NOTE  Jane Higgins ZOX:[id number] DOB: 1955-11-11 DOA: 05/28/2015  PCP: No primary care provider on file.  Brief HPI: 59 year old African-American female with a past medical history of bipolar disorder, hypertension, diabetes, presented with acute kidney injury. Nephrology was consulted. Dialysis access was obtained. She was started on dialysis.   Past medical history:  Past Medical History  Diagnosis Date  . Bipolar 1 disorder   . Hypertension   . Diabetes mellitus without complication     Consultants: Nephrology, vascular surgery  Procedures:  Hemodialysis catheter placement  Plan is for AV fistula versus graft on August 26  Antibiotics: None  Subjective: Patient complaining of diarrhea, 3 bowel movements yesterday, one today  Objective: Vital Signs  Filed Vitals:   06/04/15 0205 06/04/15 0255 06/04/15 0628 06/04/15 0754  BP: 143/84 132/69 147/69 157/74  Pulse: 79 82 80 86  Temp:  99 F (37.2 C) 99 F (37.2 C) 98.8 F (37.1 C)  TempSrc:    Oral  Resp: 16 16 17 16   Height:      Weight:      SpO2:  96% 97% 97%    Intake/Output Summary (Last 24 hours) at 06/04/15 0955 Last data filed at 06/04/15 0900  Gross per 24 hour  Intake   1640 ml  Output   1535 ml  Net    105 ml   Filed Weights   06/03/15 1949 06/03/15 2130 06/04/15 0140  Weight: 54.885 kg (121 lb) 55.2 kg (121 lb 11.1 oz) 53.9 kg (118 lb 13.3 oz)    General appearance: alert, cooperative, appears stated age and no distress Resp: clear to auscultation bilaterally Cardio: regular rate and rhythm, S1, S2 normal, no murmur, click, rub or gallop GI: soft, non-tender; bowel sounds normal; no masses,  no organomegaly Neurologic: No focal deficits  Lab Results:  Basic Metabolic Panel:  Recent Labs Lab 05/28/15 2203  05/30/15 0819 05/31/15 0612 06/01/15 0508 06/02/15 0704 06/03/15 0555 06/03/15 2233 06/04/15 0353  NA  --   < > 144 141 138 137 133* 134* 134*    K  --   < > 3.7 3.7 2.9* 2.7* 2.9* 3.4* 3.5  CL  --   < > 115* 115* 108 103 98* 101 99*  CO2  --   < > 11* 10* 19* 25 28 27 30   GLUCOSE  --   < > 98 157* 94 90 93 196* 182*  BUN  --   < > 123* 114* 62* 31* 14 24* 9  CREATININE  --   < > 14.88* 14.31* 9.00* 5.37* 3.24* 3.96* 2.06*  CALCIUM  --   < > 6.1* 6.5* 6.8* 6.8* 6.8* 7.1* 7.2*  MG 1.8  --   --   --   --  1.6*  --   --   --   PHOS 13.8*  --  10.9* 10.6* 5.8*  --   --  3.9  --   < > = values in this interval not displayed. Liver Function Tests:  Recent Labs Lab 05/28/15 2203 05/29/15 0545 05/30/15 0819 05/31/15 0612 06/01/15 0508 06/03/15 2233 06/04/15 0353  AST 9* 10*  --   --   --   --  32  ALT 9* 9*  --   --   --   --  22  ALKPHOS 63 59  --   --   --   --  64  BILITOT 0.5 0.7  --   --   --   --  0.3  PROT 6.8 6.1*  --   --   --   --  5.5*  ALBUMIN 3.6 3.5 3.1* 3.2* 2.8* 2.7* 2.7*   CBC:  Recent Labs Lab 05/30/15 0512  06/01/15 0508 06/02/15 0704 06/03/15 0555 06/03/15 2234 06/04/15 0353  WBC 11.9*  < > 10.9* 10.5 9.9 10.6* 10.2  NEUTROABS 7.7  --   --   --   --   --   --   HGB 7.6*  < > 6.3* 8.8* 8.7* 8.8* 8.8*  HCT 23.4*  < > 19.4* 26.3* 26.7* 27.2* 27.4*  MCV 79.3  < > 80.2 82.4 84.5 85.5 85.4  PLT 272  < > 121* 80* 109* 75* 95*  < > = values in this interval not displayed.  Cardiac Enzymes:  Recent Labs Lab 05/28/15 2203  CKTOTAL 330*    CBG:  Recent Labs Lab 06/03/15 1349 06/03/15 1654 06/03/15 1948 06/04/15 0254 06/04/15 0753  GLUCAP 98 181* 210* 172* 145*    Recent Results (from the past 240 hour(s))  Urine culture     Status: None   Collection Time: 05/28/15  6:15 PM  Result Value Ref Range Status   Specimen Description URINE, RANDOM  Final   Special Requests NONE  Final   Culture MULTIPLE SPECIES PRESENT, SUGGEST RECOLLECTION  Final   Report Status 05/30/2015 FINAL  Final  C difficile quick scan w PCR reflex     Status: None   Collection Time: 05/29/15  9:10 AM  Result Value  Ref Range Status   C Diff antigen NEGATIVE NEGATIVE Final   C Diff toxin NEGATIVE NEGATIVE Final   C Diff interpretation Negative for toxigenic C. difficile  Final      Studies/Results: No results found.  Medications:  Scheduled: . sodium chloride   Intravenous Once  . amLODipine  5 mg Oral Daily  . calcium acetate  1,334 mg Oral TID WC  . darbepoetin (ARANESP) injection - DIALYSIS  60 mcg Intravenous Q Thu-HD  . docusate sodium  100 mg Oral BID  . doxercalciferol  4 mcg Intravenous Q T,Th,Sa-HD  . feeding supplement (NEPRO CARB STEADY)  237 mL Oral BID BM  . insulin aspart  0-5 Units Subcutaneous QHS  . insulin aspart  0-9 Units Subcutaneous TID WC  . multivitamin  1 tablet Oral QHS  . nicotine  21 mg Transdermal Daily  . sodium chloride  3 mL Intravenous Q12H   Continuous: . sodium chloride     ZOX:[id number] **OR** acetaminophen, alum & mag hydroxide-simeth, hydrALAZINE, ondansetron **OR** ondansetron (ZOFRAN) IV, oxyCODONE-acetaminophen, senna-docusate  Assessment/Plan:  Principal Problem:   ARF (acute renal failure) Active Problems:   Hypertensive urgency   Hypokalemia   Weight loss   DM2 (diabetes mellitus, type 2)   Anemia   ESRD needing dialysis   Malnutrition of moderate degree    ARF (acute renal failure) - Nephrology is following. Dialysis catheter was placed. Initially, patient declined dialysis, but then underwent dialysis. Patient has undergone 3 sessions of hemodialysis, she is awaiting outpatient hemodialysis placement on Tuesday Thursday Saturday . Vascular surgery performed Right upper arm brachiocephalic AV fistula creation on 8/26 - Renal ultrasound reports no evidence of hydronephrosis  Hypertensive urgency - resolved. Continue amlodipine. Can increase dose of the blood pressure remains elevated. Further blood pressure management per nephrology. - Hydralazine on board prn  Hypokalemia Repleted  DM2 (diabetes mellitus, type 2) -  Currently on SSI. HbA1c was 6.0. Patient was on insulin 70/30 at  home. CBG controlled  Anemia likely secondary to chronic disease - Most likely due to Renal failure, started on aranesp - No evidence for bleeding. She was transfused 2 units of blood on August 24. Hemoglobin has responded appropriately.   Thrombocytopenia Sudden drop in platelet count was noted. Not improving 75>> 95. She was on subcutaneous heparin which was discontinued. Platelet counts are lower today compared to yesterday.   HIT panel pending at this time. Continue to monitor counts.  Diarrhea If patient continues to have persistent diarrhea will order C. difficile PCR, discontinue Colace  DVT Prophylaxis: On SCDs now    Code Status: Full code  Family Communication: Discussed with the patient  Disposition Plan: Patient needs to be set up for outpatient dialysis    LOS: 7 days   [facility]  [facility] Pager [phone]  06/04/2015, 9:55 AM  If 7PM-7AM, please contact night-coverage at [email or web address], password [facility]

## 2015-06-04 NOTE — Progress Notes (Signed)
Patient ID: [name], female   DOB: [birth year], 59 y.o.   MRN: [id number] Comfortable this morning. Antecubital incision healing nicely. Dressing removed. Excellent dilatation of right upper arm cephalic vein. Palpable right radial pulse with well-perfused hand and no steal symptoms. Stable postop day 1 from right upper arm brachiocephalic fistula creation. Will not follow actively. Will see patient in the office for follow-up in 4-6 weeks. I feel that Jane Higgins has an excellent possibility of this becoming a usable fistula.

## 2015-06-04 NOTE — Anesthesia Postprocedure Evaluation (Signed)
  Anesthesia Post-op Note  Patient: Jane Higgins  Procedure(s) Performed: Procedure(s) (LRB): ARTERIOVENOUS (AV) FISTULA CREATION  (Right)  Patient Location: PACU  Anesthesia Type: MAC  Level of Consciousness: awake and alert   Airway and Oxygen Therapy: Patient Spontanous Breathing  Post-op Pain: mild  Post-op Assessment: Post-op Vital signs reviewed, Patient's Cardiovascular Status Stable, Respiratory Function Stable, Patent Airway and No signs of Nausea or vomiting  Last Vitals:  Filed Vitals:   06/04/15 0754  BP: 157/74  Pulse: 86  Temp: 37.1 C  Resp: 16    Post-op Vital Signs: stable   Complications: No apparent anesthesia complications

## 2015-06-05 ENCOUNTER — Other Ambulatory Visit: Payer: Self-pay | Admitting: *Deleted

## 2015-06-05 DIAGNOSIS — N186 End stage renal disease: Secondary | ICD-10-CM

## 2015-06-05 DIAGNOSIS — Z4931 Encounter for adequacy testing for hemodialysis: Secondary | ICD-10-CM

## 2015-06-05 LAB — GLUCOSE, CAPILLARY
GLUCOSE-CAPILLARY: 128 mg/dL — AB (ref 65–99)
GLUCOSE-CAPILLARY: 196 mg/dL — AB (ref 65–99)
GLUCOSE-CAPILLARY: 161 mg/dL — AB (ref 65–99)

## 2015-06-05 MED ORDER — NEPRO/CARBSTEADY PO LIQD: 237.0000 mL | Freq: Two times a day (BID) | ORAL | Status: AC

## 2015-06-05 MED ORDER — CALCIUM ACETATE (PHOS BINDER) 667 MG PO CAPS: 1334.0000 mg | ORAL_CAPSULE | Freq: Three times a day (TID) | ORAL | Status: DC

## 2015-06-05 MED ORDER — RENA-VITE PO TABS: 1.0000 | ORAL_TABLET | Freq: Every day | ORAL | Status: AC

## 2015-06-05 MED ORDER — INSULIN NPH ISOPHANE & REGULAR (70-30) 100 UNIT/ML ~~LOC~~ SUSP: 5.0000 [IU] | Freq: Two times a day (BID) | SUBCUTANEOUS | Status: AC

## 2015-06-05 NOTE — Discharge Summary (Signed)
Physician Discharge Summary  Jane Higgins MRN: [id number] DOB/AGE: [birth year] 59 y.o.  PCP: No primary care provider on file.   Admit date: 05/28/2015 Discharge date: 06/05/2015  Discharge Diagnoses:   Principal Problem:   ARF (acute renal failure) Active Problems:   Hypertensive urgency   Hypokalemia   Weight loss   DM2 (diabetes mellitus, type 2)   Anemia   ESRD needing dialysis   Malnutrition of moderate degree    Follow-up recommendations Follow-up with PCP in 3-5 days , including all  additional recommended appointments as below Follow-up CBC, CMP in 3-5 days 1. she has been set up at [facility] on TTS schedule. She will need to go there on Monday afternoon to sign papers and register for outpt HD  HIT panel pending, PCP to follow-up on the results of this     Medication List    TAKE these medications        amLODipine 5 MG tablet  Commonly known as:  NORVASC  Take 1 tablet (5 mg total) by mouth daily.     calcium acetate 667 MG capsule  Commonly known as:  PHOSLO  Take 2 capsules (1,334 mg total) by mouth 3 (three) times daily with meals.     feeding supplement (NEPRO CARB STEADY) Liqd  Take 237 mLs by mouth 2 (two) times daily between meals.     insulin NPH-regular Human (70-30) 100 UNIT/ML injection  Commonly known as:  NOVOLIN 70/30  Inject 5 Units into the skin 2 (two) times daily with a meal.     metoprolol tartrate 25 MG tablet  Commonly known as:  LOPRESSOR  Take 1 tablet (25 mg total) by mouth 2 (two) times daily.     multivitamin Tabs tablet  Take 1 tablet by mouth at bedtime.     nicotine 21 mg/24hr patch  Commonly known as:  NICODERM CQ - dosed in mg/24 hours  Place 1 patch onto the skin daily.         Discharge Condition: Stable    Disposition: 06-Home-Health Care Svc   Consults: Nephrology Vascular surgery   Significant Diagnostic Studies:  X-ray Chest Pa And Lateral  05/28/2015   CLINICAL DATA:  Two weeks of  fatigue and lightheadedness. Acute and chronic renal failure.  EXAM: CHEST  2 VIEW  COMPARISON:  04/13/2013  FINDINGS: Borderline cardiomegaly is stable. Negative aortic and hilar contours. There is no edema, consolidation, effusion, or pneumothorax. Left nipple shadow noted.  IMPRESSION: 1. No acute finding or change from 2014. 2. Borderline cardiomegaly.   Electronically Signed   By: Monte Fantasia M.D.   On: 05/28/2015 21:55   Abd 1 View (kub)  05/28/2015   CLINICAL DATA:  Acute on chronic renal failure.  Constipation.  EXAM: ABDOMEN - 1 VIEW  COMPARISON:  None.  FINDINGS: There is oral contrast from the stomach to distal colon, presumably in anticipation of CT. The bowel gas pattern is nonobstructive. No concerning intra-abdominal mass effect or calcification. Lung bases are clear. No acute osseous findings.  IMPRESSION: Negative.   Electronically Signed   By: Monte Fantasia M.D.   On: 05/28/2015 21:54   US Renal  05/29/2015   CLINICAL DATA:  Acute on chronic renal failure.  Initial encounter.  EXAM: RENAL / URINARY TRACT ULTRASOUND COMPLETE  COMPARISON:  Abdominal radiograph performed earlier today at 9:29 p.m.  FINDINGS: Right Kidney:  Length: 11.0 cm. Diffusely increased parenchymal echogenicity noted. No mass or hydronephrosis visualized.  Left Kidney:  Length: 9.4 cm. Diffusely increased parenchymal echogenicity noted. No mass or hydronephrosis visualized.  Bladder:  Largely decompressed and not well assessed.  IMPRESSION: 1. No evidence of hydronephrosis. 2. Diffusely increased renal parenchymal echogenicity likely reflects medical renal disease.   Electronically Signed   By: Garald Balding M.D.   On: 05/29/2015 03:38   Ir Fluoro Guide Cv Line Right  05/30/2015   CLINICAL DATA:  End-stage renal disease, needs venous access for hemodialysis  EXAM: TUNNELED HEMODIALYSIS CATHETER PLACEMENT WITH ULTRASOUND AND FLUOROSCOPIC GUIDANCE  TECHNIQUE: The procedure, risks, benefits, and alternatives were  explained to the patient. Questions regarding the procedure were encouraged and answered. The patient understands and consents to the procedure. As antibiotic prophylaxis, cefazolin 2 g was ordered pre-procedure and administered intravenously within one hour of incision.Patency of the right IJ vein was confirmed with ultrasound with image documentation. An appropriate skin site was determined. Region was prepped using maximum barrier technique including cap and mask, sterile gown, sterile gloves, large sterile sheet, and Chlorhexidine as cutaneous antisepsis. The region was infiltrated locally with 1% lidocaine.  Intravenous Fentanyl and Versed were administered as conscious sedation during continuous cardiorespiratory monitoring by the radiology RN, with a total moderate sedation time of 15 minutes.  Under real-time ultrasound guidance, the right IJ vein was accessed with a 21 gauge micropuncture needle; the needle tip within the vein was confirmed with ultrasound image documentation. Needle exchanged over the 018 guidewire for transitional dilator, which allowed advancement of a Benson wire into the IVC. Over this, an MPA catheter was advanced. A Palindrome 23 hemodialysis catheter was tunneled from the right anterior chest wall approach to the right IJ dermatotomy site. The MPA catheter was exchanged over an Amplatz wire for serial vascular dilators which allow placement of a peel-away sheath, through which the catheter was advanced under intermittent fluoroscopy, positioned with its tips in the proximal and midright atrium. Spot chest radiograph confirms good catheter position. No pneumothorax. Catheter was flushed and primed per protocol. Catheter secured externally with O Prolene sutures. The right IJ dermatotomy site was closed with Dermabond.  COMPLICATIONS: COMPLICATIONS None immediate  FLUOROSCOPY TIME:  36 seconds ,10.2 mGy  COMPARISON:  None  IMPRESSION: 1. Technically successful placement of tunneled  right IJ hemodialysis catheter with ultrasound and fluoroscopic guidance. Ready for routine use.   Electronically Signed   By: Lucrezia Europe M.D.   On: 05/30/2015 15:39   Ir US Guide Vasc Access Right  05/30/2015   CLINICAL DATA:  End-stage renal disease, needs venous access for hemodialysis  EXAM: TUNNELED HEMODIALYSIS CATHETER PLACEMENT WITH ULTRASOUND AND FLUOROSCOPIC GUIDANCE  TECHNIQUE: The procedure, risks, benefits, and alternatives were explained to the patient. Questions regarding the procedure were encouraged and answered. The patient understands and consents to the procedure. As antibiotic prophylaxis, cefazolin 2 g was ordered pre-procedure and administered intravenously within one hour of incision.Patency of the right IJ vein was confirmed with ultrasound with image documentation. An appropriate skin site was determined. Region was prepped using maximum barrier technique including cap and mask, sterile gown, sterile gloves, large sterile sheet, and Chlorhexidine as cutaneous antisepsis. The region was infiltrated locally with 1% lidocaine.  Intravenous Fentanyl and Versed were administered as conscious sedation during continuous cardiorespiratory monitoring by the radiology RN, with a total moderate sedation time of 15 minutes.  Under real-time ultrasound guidance, the right IJ vein was accessed with a 21 gauge micropuncture needle; the needle tip within the vein was confirmed with ultrasound image documentation. Needle  exchanged over the 018 guidewire for transitional dilator, which allowed advancement of a Benson wire into the IVC. Over this, an MPA catheter was advanced. A Palindrome 23 hemodialysis catheter was tunneled from the right anterior chest wall approach to the right IJ dermatotomy site. The MPA catheter was exchanged over an Amplatz wire for serial vascular dilators which allow placement of a peel-away sheath, through which the catheter was advanced under intermittent fluoroscopy,  positioned with its tips in the proximal and midright atrium. Spot chest radiograph confirms good catheter position. No pneumothorax. Catheter was flushed and primed per protocol. Catheter secured externally with O Prolene sutures. The right IJ dermatotomy site was closed with Dermabond.  COMPLICATIONS: COMPLICATIONS None immediate  FLUOROSCOPY TIME:  36 seconds ,10.2 mGy  COMPARISON:  None  IMPRESSION: 1. Technically successful placement of tunneled right IJ hemodialysis catheter with ultrasound and fluoroscopic guidance. Ready for routine use.   Electronically Signed   By: Lucrezia Europe M.D.   On: 05/30/2015 15:39   stable    Filed Weights   06/03/15 2130 06/04/15 0140 06/04/15 2253  Weight: 55.2 kg (121 lb 11.1 oz) 53.9 kg (118 lb 13.3 oz) 56.201 kg (123 lb 14.4 oz)     Microbiology: Recent Results (from the past 240 hour(s))  Urine culture     Status: None   Collection Time: 05/28/15  6:15 PM  Result Value Ref Range Status   Specimen Description URINE, RANDOM  Final   Special Requests NONE  Final   Culture MULTIPLE SPECIES PRESENT, SUGGEST RECOLLECTION  Final   Report Status 05/30/2015 FINAL  Final  C difficile quick scan w PCR reflex     Status: None   Collection Time: 05/29/15  9:10 AM  Result Value Ref Range Status   C Diff antigen NEGATIVE NEGATIVE Final   C Diff toxin NEGATIVE NEGATIVE Final   C Diff interpretation Negative for toxigenic C. difficile  Final       Blood Culture    Component Value Date/Time   SDES URINE, RANDOM 05/28/2015 1815   SPECREQUEST NONE 05/28/2015 1815   CULT MULTIPLE SPECIES PRESENT, SUGGEST RECOLLECTION 05/28/2015 1815   REPTSTATUS 05/30/2015 FINAL 05/28/2015 1815      Labs: Results for orders placed or performed during the hospital encounter of 05/28/15 (from the past 48 hour(s))  Glucose, capillary     Status: None   Collection Time: 06/03/15  1:49 PM  Result Value Ref Range   Glucose-Capillary 98 65 - 99 mg/dL   Comment 1 Notify RN    Glucose, capillary     Status: Abnormal   Collection Time: 06/03/15  4:54 PM  Result Value Ref Range   Glucose-Capillary 181 (H) 65 - 99 mg/dL  Glucose, capillary     Status: Abnormal   Collection Time: 06/03/15  7:48 PM  Result Value Ref Range   Glucose-Capillary 210 (H) 65 - 99 mg/dL  Renal function panel     Status: Abnormal   Collection Time: 06/03/15 10:33 PM  Result Value Ref Range   Sodium 134 (L) 135 - 145 mmol/L   Potassium 3.4 (L) 3.5 - 5.1 mmol/L   Chloride 101 101 - 111 mmol/L   CO2 27 22 - 32 mmol/L   Glucose, Bld 196 (H) 65 - 99 mg/dL   BUN 24 (H) 6 - 20 mg/dL   Creatinine, Ser 3.96 (H) 0.44 - 1.00 mg/dL   Calcium 7.1 (L) 8.9 - 10.3 mg/dL   Phosphorus 3.9 2.5 - 4.6 mg/dL  Albumin 2.7 (L) 3.5 - 5.0 g/dL   GFR calc non Af Amer 11 (L) >60 mL/min   GFR calc Af Amer 13 (L) >60 mL/min    Comment: (NOTE) The eGFR has been calculated using the CKD EPI equation. This calculation has not been validated in all clinical situations. eGFR's persistently <60 mL/min signify possible Chronic Kidney Disease.    Anion gap 6 5 - 15  CBC     Status: Abnormal   Collection Time: 06/03/15 10:34 PM  Result Value Ref Range   WBC 10.6 (H) 4.0 - 10.5 K/uL   RBC 3.18 (L) 3.87 - 5.11 MIL/uL   Hemoglobin 8.8 (L) 12.0 - 15.0 g/dL   HCT 27.2 (L) 36.0 - 46.0 %   MCV 85.5 78.0 - 100.0 fL   MCH 27.7 26.0 - 34.0 pg   MCHC 32.4 30.0 - 36.0 g/dL   RDW 14.7 11.5 - 15.5 %   Platelets 75 (L) 150 - 400 K/uL    Comment: REPEATED TO VERIFY CONSISTENT WITH PREVIOUS RESULT   Glucose, capillary     Status: Abnormal   Collection Time: 06/04/15  2:54 AM  Result Value Ref Range   Glucose-Capillary 172 (H) 65 - 99 mg/dL  Comprehensive metabolic panel     Status: Abnormal   Collection Time: 06/04/15  3:53 AM  Result Value Ref Range   Sodium 134 (L) 135 - 145 mmol/L   Potassium 3.5 3.5 - 5.1 mmol/L   Chloride 99 (L) 101 - 111 mmol/L   CO2 30 22 - 32 mmol/L   Glucose, Bld 182 (H) 65 - 99 mg/dL    BUN 9 6 - 20 mg/dL   Creatinine, Ser 2.06 (H) 0.44 - 1.00 mg/dL    Comment: DELTA CHECK NOTED   Calcium 7.2 (L) 8.9 - 10.3 mg/dL   Total Protein 5.5 (L) 6.5 - 8.1 g/dL   Albumin 2.7 (L) 3.5 - 5.0 g/dL   AST 32 15 - 41 U/L   ALT 22 14 - 54 U/L   Alkaline Phosphatase 64 38 - 126 U/L   Total Bilirubin 0.3 0.3 - 1.2 mg/dL   GFR calc non Af Amer 25 (L) >60 mL/min   GFR calc Af Amer 29 (L) >60 mL/min    Comment: (NOTE) The eGFR has been calculated using the CKD EPI equation. This calculation has not been validated in all clinical situations. eGFR's persistently <60 mL/min signify possible Chronic Kidney Disease.    Anion gap 5 5 - 15  CBC     Status: Abnormal   Collection Time: 06/04/15  3:53 AM  Result Value Ref Range   WBC 10.2 4.0 - 10.5 K/uL   RBC 3.21 (L) 3.87 - 5.11 MIL/uL   Hemoglobin 8.8 (L) 12.0 - 15.0 g/dL   HCT 27.4 (L) 36.0 - 46.0 %   MCV 85.4 78.0 - 100.0 fL   MCH 27.4 26.0 - 34.0 pg   MCHC 32.1 30.0 - 36.0 g/dL   RDW 14.6 11.5 - 15.5 %   Platelets 95 (L) 150 - 400 K/uL    Comment: SPECIMEN CHECKED FOR CLOTS REPEATED TO VERIFY PLATELET COUNT CONFIRMED BY SMEAR   Glucose, capillary     Status: Abnormal   Collection Time: 06/04/15  7:53 AM  Result Value Ref Range   Glucose-Capillary 145 (H) 65 - 99 mg/dL  Glucose, capillary     Status: Abnormal   Collection Time: 06/04/15 12:08 PM  Result Value Ref Range   Glucose-Capillary 111 (  H) 65 - 99 mg/dL  Glucose, capillary     Status: Abnormal   Collection Time: 06/04/15  4:49 PM  Result Value Ref Range   Glucose-Capillary 178 (H) 65 - 99 mg/dL  Glucose, capillary     Status: Abnormal   Collection Time: 06/04/15  8:49 PM  Result Value Ref Range   Glucose-Capillary 132 (H) 65 - 99 mg/dL  Glucose, capillary     Status: Abnormal   Collection Time: 06/05/15  7:53 AM  Result Value Ref Range   Glucose-Capillary 128 (H) 65 - 99 mg/dL     Lipid Panel     Component Value Date/Time   CHOL  06/29/2007 0500    123         ATP III CLASSIFICATION:  <200     mg/dL   Desirable  200-239  mg/dL   Borderline High  >=240    mg/dL   High   TRIG 73 06/29/2007 0500   HDL 41 06/29/2007 0500   CHOLHDL 3.0 06/29/2007 0500   VLDL 15 06/29/2007 0500   LDLCALC  06/29/2007 0500    67        Total Cholesterol/HDL:CHD Risk Coronary Heart Disease Risk Table                     Men   Women  1/2 Average Risk   3.4   3.3     Lab Results  Component Value Date   HGBA1C 6.0* 05/28/2015   HGBA1C 13.3* 04/14/2013     Lab Results  Component Value Date   LDLCALC  06/29/2007    67        Total Cholesterol/HDL:CHD Risk Coronary Heart Disease Risk Table                     Men   Women  1/2 Average Risk   3.4   3.3   CREATININE 2.06* 06/04/2015     HPI :Ms. Skilton is a 59 year old female with poorly controlled DM2, HTN, bipolar disorder type 1, tobacco abuse who presented to the ED overnight with her son with several weeks of decreased energy, fatigue, lightheadedness. She reports constipation was a reason for her to come to the hospital. She was last hospitalized back in July 2014 for status epilepticus which warranted intubation and ICU stay. Since that discharge, she has not had been followed by any medical provider other than home health nursing per her report nor has she taken any medications. During this interval, she also reported unintentional weight loss as her weight on admission was 107 pounds, down from 141 pounds at her last hospitalization. She also reports change in taste and poor appetite though chest pain, dyspnea, swelling, nocturnal pain in her extremities. Admission labs were notable for BUN 136, creatinine 17 [baseline 1.6-1.8], microcytic anemia [hemoglobin 6.8, baseline 11-10].. Nephrology was consulted for further evaluation and management of her kidney failure.   HOSPITAL COURSE:  2. Vascular access- for AVF 06/03/15 by Dr. Donnetta Hutching with good response. Follow-up with vascular surgery in 6 weeks 3. ESRD  s/p 3rd HD treatment yesterday, set up for outpt HD TTS at [facility] and will need to go to the dialysis unit on Monday afternoon before her appointment time to sign papers 4. 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 Will change to hectoral 46mg with HD and stop calcitriol. Continue to follow ca/phos/iPTH levels. 6. Nutrition: cont  with renal diet 7. Hypokalemia- will use added K with HD and check magnesium level. And replete with oral replacement. 8. Hypertension:stable 9. Disposition- she has been set up at [facility] on TTS schedule. She will need to go there on Monday afternoon to sign papers and register for outpt HD. Stable for discharge from our standpoint 10. DM2 (diabetes mellitus, type 2)- Currently on SSI. HbA1c was 6.0. Restarted insulin 70/30 at 5 units twice a day .Marland Kitchen CBG controlled 11. Thrombocytopenia Sudden drop in platelet count was noted. Now improving 75>> 95. She was on subcutaneous heparin which was discontinued. HIT panel pending at this time. Continue to monitor counts.    Discharge ExamBlood pressure 145/78, pulse 87, temperature 98.7 F (37.1 C), temperature source Oral, resp. rate 16, height _0  (1.651 m), weight 56.201 kg (123 lb 14.4 oz), SpO2 95 %.  Gen:WD WN AAF in NAD CVS:no rub Resp:cta ZPS:UGAYGE Ext:no edema, RUE AVF +T/B        Discharge Instructions    Diet - low sodium heart healthy    Complete by:  As directed      Increase activity slowly    Complete by:  As directed            Follow-up Information    Follow up with Curt Jews, MD In 6 weeks.   Specialties:  Vascular Surgery, Cardiology   Why:  sent message to office   Contact information:   [place] [id number] [phone]       Follow up with PCP in 3-5 days. Schedule an appointment as soon as possible for a visit in 3 days.      SignedReyne Dumas 06/05/2015, 11:02 AM        Time  spent >45 mins

## 2015-06-05 NOTE — Progress Notes (Signed)
06/05/2015 5:30 PM  Provided renal diet education to patient and her son.  Both asked questions appropriately and verbalized understanding upon completion of education.  I reiterated that there is a nutritionist available at the kidney center  And I strongly encouraged the patient to speak with them tomorrow when she goes to fill out paperwork at the kidney center.  She stated that she would be sure to do that.  Will monitor. Theadora Rama

## 2015-06-05 NOTE — Care Management Note (Addendum)
Case Management Note  Patient Details  Name: Jane Higgins MRN: [id number] Date of Birth: Nov 08, 1955  Subjective/Objective:   Patient presented to ED with AKI, Hx ESRD started on HD this admission, AVF placed on 06/02/15                  Action/Plan:  Referral for establishing care with a PCP  Expected Discharge Date:   06/04/15               Expected Discharge Plan:  Home/Self Care  In-House Referral:     Discharge planning Services  Follow-up appt scheduled  Post Acute Care Choice:    Choice offered to:  Adult Children (Daughter Jane Higgins [phone] )  DME Arranged:    DME Agency:     HH Arranged:    HH Agency:     Status of Service:  Completed, signed off  Medicare Important Message Given:  Yes-second notification given Date Medicare IM Given:    Medicare IM give by:    Date Additional Medicare IM Given:    Additional Medicare Important Message give by:     If discussed at Long Length of Stay Meetings, dates discussed:    Additional Comments:   CM spoke with Daughter Jane Higgins [phone]) patient lives at home with daughter/ primary care giver. Patient will be starting HD at [facility] TTS. Daughter stated, that patient did not have a PCP, Offered to assist with Discussed the [facility] with patient's daughter she is agreeable with establishing care. Follow up appt.[facility] scheduled on 06/20/15 10am verbalized understanding also placed on the AVS. No further CM needs identified    Michel Bickers, RN 06/05/2015, 4:20 PM

## 2015-06-05 NOTE — Progress Notes (Signed)
Patient ID: Jane Higgins, female   DOB: 11-21-1955, 59 y.o.   MRN: [id number]  [facility] Progress Note    Subjective:   No complaints   Objective:   BP 145/78 mmHg  Pulse 87  Temp(Src) 98.7 F (37.1 C) (Oral)  Resp 16  Ht  (1.651 m)  Wt 56.201 kg (123 lb 14.4 oz)  BMI 20.62 kg/m2  SpO2 95%  Intake/Output: I/O last 3 completed shifts: In: 2483 [P.O.:2480; I.V.:3] Out: 1125 [Urine:125; Other:1000]   Intake/Output this shift:  Total I/O In: 240 [P.O.:240] Out: -  Weight change: 1.315 kg (2 lb 14.4 oz)  Physical Exam: Gen:WD WN AAF in NAD CVS:no rub, RRR Resp:cta WUJ:WJXBJY Ext:no edema, RUE AVF +T/B  Labs: BMET  Recent Labs Lab 05/30/15 0819 05/31/15 0612 06/01/15 0508 06/02/15 0704 06/03/15 0555 06/03/15 2233 06/04/15 0353  NA 144 141 138 137 133* 134* 134*  K 3.7 3.7 2.9* 2.7* 2.9* 3.4* 3.5  CL 115* 115* 108 103 98* 101 99*  CO2 11* 10* 19* GLUCOSE 98 157* 94 90 93 196* 182*  BUN 123* 114* 62* 31* 14 24* 9  CREATININE 14.88* 14.31* 9.00* 5.37* 3.24* 3.96* 2.06*  ALBUMIN 3.1* 3.2* 2.8*  --   --  2.7* 2.7*  CALCIUM 6.1* 6.5* 6.8* 6.8* 6.8* 7.1* 7.2*  PHOS 10.9* 10.6* 5.8*  --   --  3.9  --    CBC  Recent Labs Lab 05/30/15 0512  06/02/15 0704 06/03/15 0555 06/03/15 2234 06/04/15 0353  WBC 11.9*  < > 10.5 9.9 10.6* 10.2  NEUTROABS 7.7  --   --   --   --   --   HGB 7.6*  < > 8.8* 8.7* 8.8* 8.8*  HCT 23.4*  < > 26.3* 26.7* 27.2* 27.4*  MCV 79.3  < > 82.4 84.5 85.5 85.4  PLT 272  < > 80* 109* 75* 95*  < > = values in this interval not displayed.  @ Medications:    . sodium chloride   Intravenous Once  . amLODipine  5 mg Oral Daily  . calcium acetate  1,334 mg Oral TID WC  . darbepoetin (ARANESP) injection - DIALYSIS  60 mcg Intravenous Q Thu-HD  . doxercalciferol  4 mcg Intravenous Q T,Th,Sa-HD  . feeding supplement (NEPRO CARB STEADY)  237 mL Oral BID BM  . insulin aspart  0-5 Units  Subcutaneous QHS  . insulin aspart  0-9 Units Subcutaneous TID WC  . multivitamin  1 tablet Oral QHS  . nicotine  21 mg Transdermal Daily  . sodium chloride  3 mL Intravenous Q12H   HD orders: TTS at [facility], bfr400, dfr 800, time 3:45, EDW 53.5kg, std heparin, aranesp IV qTTS, no sodium modeling, RIJ TDC, new RUE AVF placed 06/03/15 by Dr. Arbie Cookey  Assessment/ Plan:   1. Vascular access- s/p RUE AVF 06/03/15 by Dr. Arbie Cookey with good response 2. ESRD s/p 3rd HD treatment yesterday, set up for outpt HD TTS at [facility] and will need to go to the dialysis unit on Monday afternoon before her appointment time to sign papers 3. 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 Will change to hectoral with HD and stop calcitriol. Continue to follow ca/phos/iPTH levels. 5. Nutrition: cont with renal diet 6. Hypokalemia- will use added K with HD and check magnesium level. And replete with oral replacement. 7. Hypertension:stable 8. Disposition-  she has been set up at [facility] on TTS schedule. She will need to go there on Monday afternoon to sign papers and register for outpt HD. Stable for discharge from our standpoint Cayde Held A 06/05/2015, 11:39 AM

## 2015-06-05 NOTE — Discharge Instructions (Signed)
1. [facility] on TTS schedule. She will need to go there on Monday afternoon to sign papers and register for outpt HD.

## 2015-06-06 ENCOUNTER — Encounter (HOSPITAL_COMMUNITY): Payer: Self-pay | Admitting: Vascular Surgery

## 2015-06-08 LAB — PROTEIN ELECTROPHORESIS, SERUM
A/G Ratio: 1.3 (ref 0.7–1.7)
ALBUMIN ELP: 3.9 g/dL (ref 2.9–4.4)
ALPHA-1-GLOBULIN: 0.3 g/dL (ref 0.0–0.4)
Alpha-2-Globulin: 0.8 g/dL (ref 0.4–1.0)
BETA GLOBULIN: 0.9 g/dL (ref 0.7–1.3)
GAMMA GLOBULIN: 1.1 g/dL (ref 0.4–1.8)
Globulin, Total: 3.1 g/dL (ref 2.2–3.9)
Total Protein ELP: 7 g/dL (ref 6.0–8.5)

## 2015-06-10 ENCOUNTER — Telehealth: Payer: Self-pay | Admitting: Vascular Surgery

## 2015-06-10 NOTE — Telephone Encounter (Addendum)
-----   Message from Sharee Pimple, RN sent at 06/05/2015 12:42 PM EDT ----- Regarding: Schedule   ----- Message -----    From: Lars Mage, PA-C    Sent: 06/04/2015  10:04 AM      To: Vvs Charge Pool  F/U with Dr. Arbie Cookey in 6 weeks s/p AV fistula creation  notified patient of post op appt. on 07-26-15 at 8:30 am with dr. early

## 2015-06-20 ENCOUNTER — Inpatient Hospital Stay: Payer: Self-pay | Admitting: Family Medicine

## 2015-07-21 ENCOUNTER — Encounter: Payer: Self-pay | Admitting: Vascular Surgery

## 2015-07-26 ENCOUNTER — Encounter: Payer: Self-pay | Admitting: Vascular Surgery

## 2015-10-04 ENCOUNTER — Inpatient Hospital Stay (HOSPITAL_COMMUNITY): Payer: Medicare Other

## 2015-10-04 ENCOUNTER — Emergency Department (HOSPITAL_COMMUNITY): Payer: Medicare Other

## 2015-10-04 ENCOUNTER — Encounter (HOSPITAL_COMMUNITY): Payer: Self-pay | Admitting: Vascular Surgery

## 2015-10-04 ENCOUNTER — Inpatient Hospital Stay (HOSPITAL_COMMUNITY)
Admission: EM | Admit: 2015-10-04 | Discharge: 2015-10-09 | DRG: 682 | Disposition: A | Discharge status: Home / Self Care | Payer: Medicare Other | Attending: Internal Medicine | Admitting: Internal Medicine

## 2015-10-04 DIAGNOSIS — E872 Acidosis: Secondary | ICD-10-CM | POA: Diagnosis present

## 2015-10-04 DIAGNOSIS — Z992 Dependence on renal dialysis: Secondary | ICD-10-CM | POA: Diagnosis present

## 2015-10-04 DIAGNOSIS — E119 Type 2 diabetes mellitus without complications: Secondary | ICD-10-CM

## 2015-10-04 DIAGNOSIS — Z8659 Personal history of other mental and behavioral disorders: Secondary | ICD-10-CM | POA: Diagnosis not present

## 2015-10-04 DIAGNOSIS — Z9115 Patient's noncompliance with renal dialysis: Secondary | ICD-10-CM | POA: Diagnosis not present

## 2015-10-04 DIAGNOSIS — D638 Anemia in other chronic diseases classified elsewhere: Secondary | ICD-10-CM | POA: Diagnosis present

## 2015-10-04 DIAGNOSIS — G9349 Other encephalopathy: Secondary | ICD-10-CM | POA: Diagnosis present

## 2015-10-04 DIAGNOSIS — N186 End stage renal disease: Secondary | ICD-10-CM | POA: Diagnosis present

## 2015-10-04 DIAGNOSIS — Z79899 Other long term (current) drug therapy: Secondary | ICD-10-CM

## 2015-10-04 DIAGNOSIS — I120 Hypertensive chronic kidney disease with stage 5 chronic kidney disease or end stage renal disease: Principal | ICD-10-CM | POA: Diagnosis present

## 2015-10-04 DIAGNOSIS — N19 Unspecified kidney failure: Secondary | ICD-10-CM | POA: Diagnosis present

## 2015-10-04 DIAGNOSIS — E1122 Type 2 diabetes mellitus with diabetic chronic kidney disease: Secondary | ICD-10-CM | POA: Diagnosis present

## 2015-10-04 DIAGNOSIS — I160 Hypertensive urgency: Secondary | ICD-10-CM | POA: Diagnosis present

## 2015-10-04 DIAGNOSIS — E46 Unspecified protein-calorie malnutrition: Secondary | ICD-10-CM | POA: Diagnosis present

## 2015-10-04 DIAGNOSIS — E8770 Fluid overload, unspecified: Secondary | ICD-10-CM | POA: Diagnosis present

## 2015-10-04 DIAGNOSIS — F17210 Nicotine dependence, cigarettes, uncomplicated: Secondary | ICD-10-CM | POA: Diagnosis present

## 2015-10-04 DIAGNOSIS — G9340 Encephalopathy, unspecified: Secondary | ICD-10-CM | POA: Diagnosis present

## 2015-10-04 DIAGNOSIS — R0602 Shortness of breath: Secondary | ICD-10-CM | POA: Diagnosis present

## 2015-10-04 DIAGNOSIS — E870 Hyperosmolality and hypernatremia: Secondary | ICD-10-CM | POA: Diagnosis present

## 2015-10-04 DIAGNOSIS — N2581 Secondary hyperparathyroidism of renal origin: Secondary | ICD-10-CM | POA: Diagnosis present

## 2015-10-04 DIAGNOSIS — E440 Moderate protein-calorie malnutrition: Secondary | ICD-10-CM | POA: Diagnosis not present

## 2015-10-04 DIAGNOSIS — E876 Hypokalemia: Secondary | ICD-10-CM | POA: Diagnosis present

## 2015-10-04 DIAGNOSIS — Z794 Long term (current) use of insulin: Secondary | ICD-10-CM | POA: Diagnosis not present

## 2015-10-04 DIAGNOSIS — Z720 Tobacco use: Secondary | ICD-10-CM

## 2015-10-04 DIAGNOSIS — D72829 Elevated white blood cell count, unspecified: Secondary | ICD-10-CM | POA: Diagnosis present

## 2015-10-04 DIAGNOSIS — I440 Atrioventricular block, first degree: Secondary | ICD-10-CM | POA: Diagnosis present

## 2015-10-04 DIAGNOSIS — R0600 Dyspnea, unspecified: Secondary | ICD-10-CM

## 2015-10-04 HISTORY — DX: End stage renal disease: N18.6

## 2015-10-04 LAB — RENAL FUNCTION PANEL
ANION GAP: 23 — AB (ref 5–15)
Albumin: 3.3 g/dL — ABNORMAL LOW (ref 3.5–5.0)
BUN: 138 mg/dL — ABNORMAL HIGH (ref 6–20)
CHLORIDE: 117 mmol/L — AB (ref 101–111)
CO2: 7 mmol/L — AB (ref 22–32)
Calcium: 7 mg/dL — ABNORMAL LOW (ref 8.9–10.3)
Creatinine, Ser: 16.48 mg/dL — ABNORMAL HIGH (ref 0.44–1.00)
GFR calc Af Amer: 2 mL/min — ABNORMAL LOW (ref >60)
GFR calc non Af Amer: 2 mL/min — ABNORMAL LOW (ref >60)
GLUCOSE: 125 mg/dL — AB (ref 65–99)
PHOSPHORUS: 15.2 mg/dL — AB (ref 2.5–4.6)
POTASSIUM: 3.9 mmol/L (ref 3.5–5.1)
Sodium: 147 mmol/L — ABNORMAL HIGH (ref 135–145)

## 2015-10-04 LAB — BASIC METABOLIC PANEL
BUN: 132 mg/dL — ABNORMAL HIGH (ref 6–20)
CHLORIDE: 116 mmol/L — AB (ref 101–111)
CREATININE: 15.98 mg/dL — AB (ref 0.44–1.00)
Calcium: 6.9 mg/dL — ABNORMAL LOW (ref 8.9–10.3)
GFR calc non Af Amer: 2 mL/min — ABNORMAL LOW (ref >60)
GFR, EST AFRICAN AMERICAN: 2 mL/min — AB (ref >60)
Glucose, Bld: 111 mg/dL — ABNORMAL HIGH (ref 65–99)
Potassium: 4 mmol/L (ref 3.5–5.1)
Sodium: 147 mmol/L — ABNORMAL HIGH (ref 135–145)

## 2015-10-04 LAB — IRON AND TIBC
Iron: 144 ug/dL (ref 28–170)
SATURATION RATIOS: 86 % — AB (ref 10.4–31.8)
TIBC: 168 ug/dL — ABNORMAL LOW (ref 250–450)
UIBC: 24 ug/dL

## 2015-10-04 LAB — CBC
HEMATOCRIT: 35.7 % — AB (ref 36.0–46.0)
HEMOGLOBIN: 11.2 g/dL — AB (ref 12.0–15.0)
MCH: 25 pg — ABNORMAL LOW (ref 26.0–34.0)
MCHC: 31.4 g/dL (ref 30.0–36.0)
MCV: 79.7 fL (ref 78.0–100.0)
Platelets: 330 10*3/uL (ref 150–400)
RBC: 4.48 MIL/uL (ref 3.87–5.11)
RDW: 18.9 % — ABNORMAL HIGH (ref 11.5–15.5)
WBC: 12.6 10*3/uL — AB (ref 4.0–10.5)

## 2015-10-04 LAB — I-STAT TROPONIN, ED: Troponin i, poc: 0.06 ng/mL (ref 0.00–0.08)

## 2015-10-04 LAB — CBG MONITORING, ED: Glucose-Capillary: 88 mg/dL (ref 65–99)

## 2015-10-04 MED ORDER — AMLODIPINE BESYLATE 5 MG PO TABS
5.0000 mg | ORAL_TABLET | Freq: Once | ORAL | Status: AC
  Administered 2015-10-04: 5 mg via ORAL
  Filled 2015-10-04: qty 1

## 2015-10-04 MED ORDER — CALCIUM ACETATE (PHOS BINDER) 667 MG PO CAPS: 2001.0000 mg | ORAL_CAPSULE | Freq: Three times a day (TID) | ORAL | Status: DC

## 2015-10-04 MED ORDER — METOPROLOL TARTRATE 1 MG/ML IV SOLN
5.0000 mg | Freq: Once | INTRAVENOUS | Status: AC
  Administered 2015-10-04: 5 mg via INTRAVENOUS
  Filled 2015-10-04: qty 5

## 2015-10-04 NOTE — ED Notes (Signed)
Pt reports to the ED for eval of AMS, SOB, and extremity swelling. Per family pt has not had dialysis in 2 weeks because she does not want to go. The confusion began 3 days ago. Swelling and SOB began last week. Pt denies any CP. She is alert. Resp e/u at this time and skin warm and dry. Oriented to person and place. Disoriented to situation and time.

## 2015-10-04 NOTE — ED Notes (Signed)
Dinner tray arrived 

## 2015-10-04 NOTE — Progress Notes (Signed)
Hemodialysis- Tolerated procedure well. UF 2L without issue. Pt remains alert and oriented x2, continues to try and get out of bed. Report given to RN on 6E. Pt transferred in stable condition.

## 2015-10-04 NOTE — ED Notes (Signed)
CHECKED CBG 88

## 2015-10-04 NOTE — ED Provider Notes (Signed)
The patient is a 59 year old female, she has end-stage renal disease and started dialysis in August, presents with altered mental status and according to the family members this has been gradually worsening, they feel as though she has been somewhat tilted to the side though they do not remember which way, she has had difficulty doing simple tasks stating that it takes too long, she is having difficulty speaking but is able to get the words out, she has no fevers vomiting coughing dysuria or diarrhea.  On exam the patient has slight rales at the bases, she is in no respiratory distress and has clear heart sounds. Abdomen is soft, lower extremities have bilateral severe peripheral edema. They are symmetrical, there is no redness.  The patient is unable to answer my questions appropriately, she does answer questions but continues to say that she had a virus which is why she missed dialysis for almost 2 weeks. The catheter in her right upper chest wall appears clean  CT scan of the brain to evaluate for AMS, though suspect Uremia Nephrology consult IM admission  Medical screening examination/treatment/procedure(s) were conducted as a shared visit with non-physician practitioner(s) and myself.  I personally evaluated the patient during the encounter.  Clinical Impression:   Final diagnoses:  Uremia  Acute encephalopathy         Eber HongBrian Jyair Kiraly, MD 10/08/15 2203

## 2015-10-04 NOTE — ED Notes (Signed)
Iv attempted x 2 without success. 

## 2015-10-04 NOTE — H&P (Signed)
[facility] History and Physical  [name]:[id number] DOB: 03-03-1956 DOA: 10/04/2015  Referring physician: Mr. Felicie Morn, NP (ED) PCP: No primary care provider on file.  Specialists:   Chief Complaint: Confusion  HPI: Jane Higgins is a 59 y.o. female  With a history of end-stage renal disease, diabetes mellitus, hypertension that presented to the emergency department with confusion. Patient was brought in by her family members. They state over the last several days, patient has become more more confused. This occurred when patient first dialysis. After questioning patient, his next dialysis for the past 2 weeks due to "viral illness". Patient could not be very specifically what type of illness or symptoms she was having. Currently patient denies any abdominal pain, nausea, vomiting, chest pain, dizziness or headache. She does complain of some shortness of breath. Patient states she did have nausea and vomiting, diarrhea and constipation over the past 2 weeks. In the emergency department, labs showed leukocytosis of 0.6, creatinine of 15.98, BUN of 132, CO2 less than 7, sodium 147, chloride 116.  [facility] called for admission.  Review of Systems: (patient still appears confused) Constitutional: Denies fever, chills, diaphoresis, appetite change and fatigue.  HEENT: Denies photophobia, eye pain, redness, hearing loss, ear pain, congestion, sore throat, rhinorrhea, sneezing, mouth sores, trouble swallowing, neck pain, neck stiffness and tinnitus.   Respiratory: Complains of SOB  Cardiovascular: Denies chest pain, palpitations and leg swelling.  Gastrointestinal: Complains of nausea, vomiting, diarrhea and constipation however has all subsided. Genitourinary: Denies dysuria, urgency, frequency, hematuria, flank pain and difficulty urinating.  Musculoskeletal: Denies myalgias, back pain, joint swelling, arthralgias and gait problem.  Skin: Denies pallor, rash and wound.    Neurological: Denies dizziness, seizures, syncope, weakness, light-headedness, numbness and headaches.  Hematological: Denies adenopathy. Easy bruising, personal or family bleeding history  Psychiatric/Behavioral: Denies suicidal ideation, mood changes, confusion, nervousness, sleep disturbance and agitation  Past Medical History  Diagnosis Date  . Bipolar 1 disorder (HCC)   . Hypertension   . Diabetes mellitus without complication (HCC)   . ESRD (end stage renal disease) [facility])    Past Surgical History  Procedure Laterality Date  . Tubal ligation    . Av fistula placement Right 06/03/2015    Procedure: ARTERIOVENOUS (AV) FISTULA CREATION ;  Surgeon: Larina Earthly, MD;  Location: [facility] OR;  Service: Vascular;  Laterality: Right;   Social History:  reports that she has been smoking Cigarettes.  She has been smoking about 1.00 pack per day. She does not have any smokeless tobacco history on file. She reports that she does not drink alcohol or use illicit drugs.   Allergies  Allergen Reactions  . Iodine Other (See Comments)    unknown    Family History  Problem Relation Age of Onset  . Colon cancer      Neg history  . Kidney disease Cousin     Prior to Admission medications   Medication Sig Start Date End Date Taking? Authorizing Provider  amLODipine (NORVASC) 5 MG tablet Take 1 tablet (5 mg total) by mouth daily. 04/16/13   Tora Kindred York, PA-C  calcium acetate (PHOSLO) 667 MG capsule Take 2 capsules (1,334 mg total) by mouth 3 (three) times daily with meals. 06/05/15   Richarda Overlie, MD  insulin NPH-regular Human (NOVOLIN 70/30) (70-30) 100 UNIT/ML injection Inject 5 Units into the skin 2 (two) times daily with a meal. 06/05/15   Richarda Overlie, MD  metoprolol tartrate (LOPRESSOR) 25 MG tablet Take 1  tablet (25 mg total) by mouth 2 (two) times daily. 04/16/13   Stephani PoliceMarianne L York, PA-C  multivitamin (RENA-VIT) TABS tablet Take 1 tablet by mouth at bedtime. 06/05/15   Richarda OverlieNayana Abrol, MD   nicotine (NICODERM CQ - DOSED IN MG/24 HOURS) 21 mg/24hr patch Place 1 patch onto the skin daily. 04/16/13   Stephani PoliceMarianne L York, PA-C  Nutritional Supplements (FEEDING SUPPLEMENT, NEPRO CARB STEADY,) LIQD Take 237 mLs by mouth 2 (two) times daily between meals. 06/05/15   Richarda OverlieNayana Abrol, MD   Physical Exam: Filed Vitals:   10/04/15 1715 10/04/15 1723  BP: 190/82 190/82  Pulse: 78   Temp:    Resp: 13      General: Well developed, thin, NAD, appears stated age  HEENT: NCAT, PERRLA, EOMI, Anicteic Sclera, mucous membranes moist.   Neck: Supple, no JVD, no masses  Cardiovascular: S1 S2 auscultated, no rubs, murmurs or gallops. Regular rate and rhythm.  Respiratory: Clear to auscultation bilaterally with equal chest rise  Abdomen: Soft, nontender, nondistended, + bowel sounds  Extremities: warm dry without cyanosis clubbing. 2+edema in LE B/L. RUE AVF +B/T  Neuro: AAOx2, cranial nerves grossly intact. Strength equal and bilateral in upper/lower ext.  Skin: Without rashes exudates or nodules  Psych: Normal affect, question capacity  Labs on Admission:  Basic Metabolic Panel:  Recent Labs Lab 10/04/15 1448  NA 147*  K 4.0  CL 116*  CO2 <7*  GLUCOSE 111*  BUN 132*  CREATININE 15.98*  CALCIUM 6.9*   Liver Function Tests: No results for input(s): AST, ALT, ALKPHOS, BILITOT, PROT, ALBUMIN in the last 168 hours. No results for input(s): LIPASE, AMYLASE in the last 168 hours. No results for input(s): AMMONIA in the last 168 hours. CBC:  Recent Labs Lab 10/04/15 1448  WBC 12.6*  HGB 11.2*  HCT 35.7*  MCV 79.7  PLT 330   Cardiac Enzymes: No results for input(s): CKTOTAL, CKMB, CKMBINDEX, TROPONINI in the last 168 hours.  BNP (last 3 results) No results for input(s): BNP in the last 8760 hours.  ProBNP (last 3 results) No results for input(s): PROBNP in the last 8760 hours.  CBG:  Recent Labs Lab 10/04/15 1430  GLUCAP 88    Radiological Exams on  Admission: Dg Chest 2 View  10/04/2015  CLINICAL DATA:  Altered mental status, hypertension, diabetes mellitus, end-stage renal disease EXAM: CHEST  2 VIEW COMPARISON:  05/28/2015 FINDINGS: RIGHT jugular dual-lumen central venous catheter with tip projecting over RIGHT atrium. Enlargement of cardiac silhouette with pulmonary vascular congestion. Tips of lung apices excluded. No definite acute infiltrate, pleural effusion or pneumothorax. Bones unremarkable. IMPRESSION: Enlargement of cardiac silhouette with pulmonary vascular congestion. No acute abnormalities. Electronically Signed   By: Ulyses SouthwardMark  Boles M.D.   On: 10/04/2015 16:02    EKG: Independently reviewed. Sinus rhythm, rate 79  Assessment/Plan Acute encephalopathy -Patient be admitted to telemetry -CT head pending -likely secondary to missed dialysis -Chest x-ray shows pulmonary vascular congestion, no acute abnormalities -Patient currently afebrile although does have mild leukocytosis -Conduct neuro checks q4hrs  End-stage renal disease -Patient has missed dialysis for the last 2 weeks due to "viral illness" -Nephrology consulted and appreciated -Continue in a right  Dyspnea -Likely secondary to missed dialysis -Chest x-ray shows pulmonary vascular congestion  Leukocytosis -Likely reactive, will continue to monitor CBC  Hypernatremia -Likely secondary to missed dialysis -Continue to monitor BMP  Diabetes mellitus, type II -Will place patient on insulin slight scale CBG monitoring -Last hemoglobin A1c 6.0 on  05/28/2015  Essential hypertension -Continue metoprolol and amlodipine  Malnutrition -Continue nutritional supplements  Lower extremity edema -Secondary to missed dialysis  Tobacco abuse -Continue nicotine patch  DVT prophylaxis: Heparin  Code Status: Full  Condition: Guarded  Family Communication: family at bedside. Admission, patients condition and plan of care including tests being ordered have been  discussed with the patient and family who indicate understanding and agree with the plan and Code Status.  Disposition Plan: Admitted   Time spent: 60 minutes  Phill Steck D.O. [facility] Pager [phone]  If 7PM-7AM, please contact night-coverage [email or web address] Password [facility] 10/04/2015, 5:46 PM

## 2015-10-04 NOTE — Procedures (Signed)
I have personally attended this patient's dialysis session. With some difficulty, finally able to aspirate from catheter which will use for her HD She DOES have an upper arm AVF that we can TRY to use if catheter fx inadequate (it should be mature time wise but has never been used)    Jane Balynthia Susie Ehresman, MD [facility] [phone] Pager 10/04/2015, 8:58 PM

## 2015-10-04 NOTE — ED Provider Notes (Signed)
CSN: [id number]     Arrival date & time 10/04/15  1423 History   First MD Initiated Contact with Patient 10/04/15 1608     Chief Complaint  Patient presents with  . Altered Mental Status     (Consider location/radiation/quality/duration/timing/severity/associated sxs/prior Treatment) HPI Comments: Patient with history of ESRD, has missed dialysis for about two weeks. Family states patient has been increasingly confused over the last 3 days.  She is confused to time and event.  She recognizes her family and identifies them correctly.  Patient is a 59 y.o. female presenting with altered mental status. The history is provided by the patient and a relative. The history is limited by the condition of the patient. No language interpreter was used.  Altered Mental Status Presenting symptoms: confusion and disorientation   Severity:  Moderate Duration:  3 days Timing:  Constant Progression:  Worsening Chronicity:  New Associated symptoms: no abdominal pain, no nausea and no rash     Past Medical History  Diagnosis Date  . Bipolar 1 disorder (HCC)   . Hypertension   . Diabetes mellitus without complication (HCC)   . ESRD (end stage renal disease) [facility])    Past Surgical History  Procedure Laterality Date  . Tubal ligation    . Av fistula placement Right 06/03/2015    Procedure: ARTERIOVENOUS (AV) FISTULA CREATION ;  Surgeon: Larina Earthly, MD;  Location: [facility] OR;  Service: Vascular;  Laterality: Right;   Family History  Problem Relation Age of Onset  . Colon cancer      Neg history  . Kidney disease Cousin    Social History  Substance Use Topics  . Smoking status: Current Every Day Smoker -- 1.00 packs/day    Types: Cigarettes  . Smokeless tobacco: None  . Alcohol Use: No     Comment: no   OB History    No data available     Review of Systems  Constitutional: Positive for fatigue.  Respiratory: Positive for shortness of breath.   Cardiovascular: Positive for leg swelling.  Negative for chest pain.  Gastrointestinal: Positive for diarrhea. Negative for nausea and abdominal pain.  Skin: Negative for rash.  Psychiatric/Behavioral: Positive for confusion.  All other systems reviewed and are negative.     Allergies  Iodine  Home Medications   Prior to Admission medications   Medication Sig Start Date End Date Taking? Authorizing Provider  amLODipine (NORVASC) 5 MG tablet Take 1 tablet (5 mg total) by mouth daily. 04/16/13   Tora Kindred York, PA-C  calcium acetate (PHOSLO) 667 MG capsule Take 2 capsules (1,334 mg total) by mouth 3 (three) times daily with meals. 06/05/15   Richarda Overlie, MD  insulin NPH-regular Human (NOVOLIN 70/30) (70-30) 100 UNIT/ML injection Inject 5 Units into the skin 2 (two) times daily with a meal. 06/05/15   Richarda Overlie, MD  metoprolol tartrate (LOPRESSOR) 25 MG tablet Take 1 tablet (25 mg total) by mouth 2 (two) times daily. 04/16/13   Stephani Police, PA-C  multivitamin (RENA-VIT) TABS tablet Take 1 tablet by mouth at bedtime. 06/05/15   Richarda Overlie, MD  nicotine (NICODERM CQ - DOSED IN MG/24 HOURS) 21 mg/24hr patch Place 1 patch onto the skin daily. 04/16/13   Stephani Police, PA-C  Nutritional Supplements (FEEDING SUPPLEMENT, NEPRO CARB STEADY,) LIQD Take 237 mLs by mouth 2 (two) times daily between meals. 06/05/15   Richarda Overlie, MD   BP 189/79 mmHg  Pulse 78  Temp(Src) 98.1 F (36.7  C) (Oral)  Resp 18  SpO2 99% Physical Exam  HENT:  Head: Normocephalic and atraumatic.  Mouth/Throat: Oropharynx is clear and moist.  Eyes: EOM are normal. Pupils are equal, round, and reactive to light.  Neck: Neck supple.  Cardiovascular: Normal rate and intact distal pulses.  An irregular rhythm present.  Pulmonary/Chest: Effort normal and breath sounds normal.  Abdominal: Soft. Bowel sounds are normal.  Musculoskeletal: She exhibits edema. She exhibits no tenderness.  Lymphadenopathy:    She has no cervical adenopathy.  Neurological: She has  normal strength. She is disoriented. GCS eye subscore is 4. GCS verbal subscore is 4. GCS motor subscore is 6.  Skin: Skin is warm and dry.  Psychiatric: She is slowed. Cognition and memory are impaired.  Nursing note and vitals reviewed.   ED Course  Procedures (including critical care time) Labs Review Labs Reviewed  BASIC METABOLIC PANEL - Abnormal; Notable for the following:    Sodium 147 (*)    Chloride 116 (*)    CO2 <7 (*)    Glucose, Bld 111 (*)    BUN 132 (*)    Creatinine, Ser 15.98 (*)    Calcium 6.9 (*)    GFR calc non Af Amer 2 (*)    GFR calc Af Amer 2 (*)    All other components within normal limits  CBC - Abnormal; Notable for the following:    WBC 12.6 (*)    Hemoglobin 11.2 (*)    HCT 35.7 (*)    MCH 25.0 (*)    RDW 18.9 (*)    All other components within normal limits  RENAL FUNCTION PANEL  CBG MONITORING, ED  Rosezena Sensor, ED    Imaging Review Dg Chest 2 View  10/04/2015  CLINICAL DATA:  Altered mental status, hypertension, diabetes mellitus, end-stage renal disease EXAM: CHEST  2 VIEW COMPARISON:  05/28/2015 FINDINGS: RIGHT jugular dual-lumen central venous catheter with tip projecting over RIGHT atrium. Enlargement of cardiac silhouette with pulmonary vascular congestion. Tips of lung apices excluded. No definite acute infiltrate, pleural effusion or pneumothorax. Bones unremarkable. IMPRESSION: Enlargement of cardiac silhouette with pulmonary vascular congestion. No acute abnormalities. Electronically Signed   By: Ulyses Southward M.D.   On: 10/04/2015 16:02   Ct Head Wo Contrast  10/04/2015  CLINICAL DATA:  Frontal headache and blurred vision. Symptom duration unspecified. EXAM: CT HEAD WITHOUT CONTRAST TECHNIQUE: Contiguous axial images were obtained from the base of the skull through the vertex without intravenous contrast. COMPARISON:  CT head 04/12/2013.  MR head 04/14/2013. FINDINGS: There is a large remote RIGHT MCA territory infarct affecting the  RIGHT parietal lobe. There is well-defined encephalomalacia and compensatory enlargement of the atrium of the RIGHT lateral ventricle. No acute infarct, hemorrhage, mass lesion, hydrocephalus, or extra-axial fluid. No apparent white matter disease. Calvarium intact. No visible sinus or mastoid fluid. Advanced vascular calcification in the carotid siphons. BILATERAL TMJ arthritis. Compared with prior studies, this large infarct was not present. IMPRESSION: Large remote RIGHT MCA territory infarct. No acute intracranial findings. Electronically Signed   By: Elsie Stain M.D.   On: 10/04/2015 19:47   I have personally reviewed and evaluated these images and lab results as part of my medical decision-making.   EKG Interpretation   Date/Time:  Tuesday October 04 2015 14:53:07 EST Ventricular Rate:  79 PR Interval:  200 QRS Duration: 108 QT Interval:  370 QTC Calculation: 424 R Axis:   1 Text Interpretation:  Sinus rhythm with occasional Premature  ventricular  complexes Anterior infarct , age undetermined Abnormal ECG since last  tracing no significant change Confirmed by MILLER  MD, BRIAN ([id number]) on  10/04/2015 4:42:25 PM     Sinus rhythm, borderline first degree block, with PVC's. Hypertension treated with 5 mg lopressor IV and 5 mg norvasc po. Patient discussed with and seen by Dr. Hyacinth MeekerMiller. AMS likely due to uremia.  Patient will be admitted to medicine for inpatient dialysis. MDM   Final diagnoses:  None    Altered mental status. Uremic.    Higgins Mornavid Kynlie Jane, NP 10/04/15 2018  Eber HongBrian Miller, MD 10/08/15 [phone]
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
## 2015-10-05 LAB — GLUCOSE, CAPILLARY
GLUCOSE-CAPILLARY: 101 mg/dL — AB (ref 65–99)
GLUCOSE-CAPILLARY: 120 mg/dL — AB (ref 65–99)
GLUCOSE-CAPILLARY: 206 mg/dL — AB (ref 65–99)
Glucose-Capillary: 179 mg/dL — ABNORMAL HIGH (ref 65–99)

## 2015-10-05 LAB — CBC
HCT: 33.1 % — ABNORMAL LOW (ref 36.0–46.0)
HEMOGLOBIN: 10.6 g/dL — AB (ref 12.0–15.0)
MCH: 24.4 pg — ABNORMAL LOW (ref 26.0–34.0)
MCHC: 32 g/dL (ref 30.0–36.0)
MCV: 76.3 fL — AB (ref 78.0–100.0)
PLATELETS: 262 10*3/uL (ref 150–400)
RBC: 4.34 MIL/uL (ref 3.87–5.11)
RDW: 18.4 % — ABNORMAL HIGH (ref 11.5–15.5)
WBC: 12.6 10*3/uL — AB (ref 4.0–10.5)

## 2015-10-05 LAB — RENAL FUNCTION PANEL
Albumin: 3.1 g/dL — ABNORMAL LOW (ref 3.5–5.0)
Anion gap: 20 — ABNORMAL HIGH (ref 5–15)
BUN: 85 mg/dL — AB (ref 6–20)
CHLORIDE: 110 mmol/L (ref 101–111)
CO2: 14 mmol/L — AB (ref 22–32)
CREATININE: 11.33 mg/dL — AB (ref 0.44–1.00)
Calcium: 6.9 mg/dL — ABNORMAL LOW (ref 8.9–10.3)
GFR calc Af Amer: 4 mL/min — ABNORMAL LOW (ref >60)
GFR, EST NON AFRICAN AMERICAN: 3 mL/min — AB (ref >60)
Glucose, Bld: 126 mg/dL — ABNORMAL HIGH (ref 65–99)
Phosphorus: 9.2 mg/dL — ABNORMAL HIGH (ref 2.5–4.6)
Potassium: 2.7 mmol/L — CL (ref 3.5–5.1)
Sodium: 144 mmol/L (ref 135–145)

## 2015-10-05 MED ORDER — SODIUM CHLORIDE 0.9 % IJ SOLN: 3.0000 mL | INTRAMUSCULAR | Status: DC | PRN

## 2015-10-05 MED ORDER — POTASSIUM CHLORIDE CRYS ER 20 MEQ PO TBCR
30.0000 meq | EXTENDED_RELEASE_TABLET | Freq: Two times a day (BID) | ORAL | Status: DC
  Administered 2015-10-05: 30 meq via ORAL
  Filled 2015-10-05 (×2): qty 2

## 2015-10-05 MED ORDER — METOPROLOL TARTRATE 25 MG PO TABS
25.0000 mg | ORAL_TABLET | Freq: Two times a day (BID) | ORAL | Status: DC
  Administered 2015-10-05 – 2015-10-09 (×10): 25 mg via ORAL
  Filled 2015-10-05 (×10): qty 1

## 2015-10-05 MED ORDER — CALCIUM ACETATE (PHOS BINDER) 667 MG PO CAPS
1334.0000 mg | ORAL_CAPSULE | Freq: Three times a day (TID) | ORAL | Status: DC
  Filled 2015-10-05: qty 2

## 2015-10-05 MED ORDER — POTASSIUM CHLORIDE 10 MEQ/100ML IV SOLN
10.0000 meq | INTRAVENOUS | Status: AC
  Administered 2015-10-05 (×2): 10 meq via INTRAVENOUS
  Filled 2015-10-05 (×2): qty 100

## 2015-10-05 MED ORDER — RENA-VITE PO TABS
1.0000 | ORAL_TABLET | Freq: Every day | ORAL | Status: DC
  Administered 2015-10-05 – 2015-10-08 (×4): 1 via ORAL
  Filled 2015-10-05 (×5): qty 1

## 2015-10-05 MED ORDER — CALCIUM ACETATE (PHOS BINDER) 667 MG PO CAPS
2001.0000 mg | ORAL_CAPSULE | Freq: Three times a day (TID) | ORAL | Status: DC
  Administered 2015-10-05 – 2015-10-09 (×12): 2001 mg via ORAL
  Filled 2015-10-05 (×11): qty 3

## 2015-10-05 MED ORDER — SODIUM CHLORIDE 0.9 % IV SOLN: 250.0000 mL | INTRAVENOUS | Status: DC | PRN

## 2015-10-05 MED ORDER — NEPRO/CARBSTEADY PO LIQD
237.0000 mL | Freq: Two times a day (BID) | ORAL | Status: DC
  Administered 2015-10-05 – 2015-10-07 (×5): 237 mL via ORAL

## 2015-10-05 MED ORDER — HEPARIN SODIUM (PORCINE) 5000 UNIT/ML IJ SOLN
5000.0000 [IU] | Freq: Three times a day (TID) | INTRAMUSCULAR | Status: DC
  Administered 2015-10-05 – 2015-10-09 (×13): 5000 [IU] via SUBCUTANEOUS
  Filled 2015-10-05 (×10): qty 1

## 2015-10-05 MED ORDER — ONDANSETRON HCL 4 MG PO TABS: 4.0000 mg | ORAL_TABLET | Freq: Four times a day (QID) | ORAL | Status: DC | PRN

## 2015-10-05 MED ORDER — ACETAMINOPHEN 325 MG PO TABS
650.0000 mg | ORAL_TABLET | Freq: Four times a day (QID) | ORAL | Status: DC | PRN
  Administered 2015-10-08 (×2): 650 mg via ORAL
  Filled 2015-10-05 (×2): qty 2

## 2015-10-05 MED ORDER — SODIUM CHLORIDE 0.9 % IJ SOLN
3.0000 mL | Freq: Two times a day (BID) | INTRAMUSCULAR | Status: DC
  Administered 2015-10-05 – 2015-10-09 (×9): 3 mL via INTRAVENOUS

## 2015-10-05 MED ORDER — NICOTINE 21 MG/24HR TD PT24
21.0000 mg | MEDICATED_PATCH | Freq: Every day | TRANSDERMAL | Status: DC
  Administered 2015-10-05 – 2015-10-09 (×5): 21 mg via TRANSDERMAL
  Filled 2015-10-05 (×4): qty 1

## 2015-10-05 MED ORDER — AMLODIPINE BESYLATE 5 MG PO TABS
5.0000 mg | ORAL_TABLET | Freq: Every day | ORAL | Status: DC
  Administered 2015-10-05 – 2015-10-09 (×5): 5 mg via ORAL
  Filled 2015-10-05 (×5): qty 1

## 2015-10-05 MED ORDER — ACETAMINOPHEN 650 MG RE SUPP: 650.0000 mg | Freq: Four times a day (QID) | RECTAL | Status: DC | PRN

## 2015-10-05 MED ORDER — INSULIN ASPART 100 UNIT/ML ~~LOC~~ SOLN
0.0000 [IU] | Freq: Three times a day (TID) | SUBCUTANEOUS | Status: DC
  Administered 2015-10-05: 2 [IU] via SUBCUTANEOUS
  Administered 2015-10-06: 3 [IU] via SUBCUTANEOUS
  Administered 2015-10-06: 2 [IU] via SUBCUTANEOUS
  Administered 2015-10-07 (×3): 1 [IU] via SUBCUTANEOUS
  Administered 2015-10-08: 2 [IU] via SUBCUTANEOUS
  Administered 2015-10-09: 1 [IU] via SUBCUTANEOUS
  Administered 2015-10-09: 3 [IU] via SUBCUTANEOUS

## 2015-10-05 MED ORDER — ONDANSETRON HCL 4 MG/2ML IJ SOLN: 4.0000 mg | Freq: Four times a day (QID) | INTRAMUSCULAR | Status: DC | PRN
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
## 2015-10-05 NOTE — Progress Notes (Addendum)
[facility]                                                                              Patient Demographics  Jane Higgins, is a 59 y.o. female, DOB - December 30, 1955, [id number]  Admit date - 10/04/2015   Admitting Physician Edsel Petrin, DO  Outpatient Primary MD for the patient is No primary care provider on file.  LOS - 1   Chief Complaint  Patient presents with  . Altered Mental Status      HPI on 10/04/2015  Jane Higgins is a 59 y.o. female with a history of end-stage renal disease, diabetes mellitus, hypertension that presented to the emergency department with confusion. Patient was brought in by her family members. They state over the last several days, patient has become more more confused. This occurred when patient first dialysis. After questioning patient, his next dialysis for the past 2 weeks due to "viral illness". Patient could not be very specifically what type of illness or symptoms she was having. Currently patient denies any abdominal pain, nausea, vomiting, chest pain, dizziness or headache. She does complain of some shortness of breath. Patient states she did have nausea and vomiting, diarrhea and constipation over the past 2 weeks. In the emergency department, labs showed leukocytosis of 0.6, creatinine of 15.98, BUN of 132, CO2 less than 7, sodium 147, chloride 116. [facility] called for admission.  Assessment & Plan   Acute encephalopathy -CT head: Large remote Right MCA territory infarct, no acute intracranial findings -spoke with Dr Roseanne Reno, neurology, regarding CT findings, CVA old, no further workup needed -likely secondary to missed dialysis/uremia; unlikely infectious- CXR neg -Chest x-ray shows pulmonary vascular congestion, no acute abnormalities -Patient currently afebrile although does have mild leukocytosis -Continues to be altered, AAOx2 -Continue to monitor closely  End-stage renal disease -Patient has missed dialysis for the last 2  weeks due to "viral illness" -Nephrology consulted and appreciated -Continue Renavit  Dyspnea -improving, Likely secondary to missed dialysis -Chest x-ray shows pulmonary vascular congestion  Leukocytosis -Likely reactive, will continue to monitor CBC  Hypernatremia -Resolved, Likely secondary to missed dialysis -Continue to monitor BMP  Diabetes mellitus, type II -Continue insulin sliding scale CBG monitoring -Last hemoglobin A1c 6.0 on 05/28/2015  Essential hypertension -Continue metoprolol and amlodipine  Malnutrition -Continue nutritional supplements  Lower extremity edema -Improving, Secondary to missed dialysis  Tobacco abuse -Continue nicotine patch  Hypokalemia -Being replaced, continue to monitor BMP -Will obtain Magnesium level  Code Status: Full  Family Communication: None at bedside  Disposition Plan: Admitted. Continue HD.  Patient still altered  Time Spent in minutes   30 minutes  Procedures  None  Consults   Nephrology Neurology, Dr. Roseanne Reno, via phone  DVT Prophylaxis  heparin  Lab Results  Component Value Date   PLT 262 10/05/2015    Medications  Scheduled Meds: . amLODipine  5 mg Oral Daily  . calcium acetate  2,001 mg Oral TID WC  . feeding supplement (NEPRO CARB STEADY)  237 mL Oral BID BM  . heparin  5,000 Units Subcutaneous 3 times per day  . insulin aspart  0-9 Units Subcutaneous TID WC  . metoprolol tartrate  25 mg Oral BID  . multivitamin  1 tablet Oral QHS  . nicotine  21 mg Transdermal Daily  . potassium chloride  10 mEq Intravenous Q1 Hr x 2  . sodium chloride  3 mL Intravenous Q12H   Continuous Infusions:  PRN Meds:.sodium chloride, acetaminophen **OR** acetaminophen, ondansetron **OR** ondansetron (ZOFRAN) IV, sodium chloride  Antibiotics    Anti-infectives    None      Subjective:   Jane Higgins seen and examined today.  Has no complaints.  Still seems a bit confused.  Denies chest pain, shortness of  breath, abdominal pain, nausea/vomiting.  Objective:   Filed Vitals:   10/04/15 2330 10/05/15 0014 10/05/15 0500 10/05/15 1000  BP: 160/85 155/70 164/62 166/57  Pulse: 63 69 58 63  Temp: 97.4 F (36.3 C) 97.7 F (36.5 C) 97.5 F (36.4 C) 98.2 F (36.8 C)  TempSrc: Oral Oral Oral Oral  Resp: 16 18 18 18   Weight:  52.889 kg (116 lb 9.6 oz)    SpO2: 100% 100% 98% 99%    Wt Readings from Last 3 Encounters:  10/05/15 52.889 kg (116 lb 9.6 oz)  06/04/15 56.201 kg (123 lb 14.4 oz)  04/13/13 64.1 kg (141 lb 5 oz)     Intake/Output Summary (Last 24 hours) at 10/05/15 1255 Last data filed at 10/05/15 0900  Gross per 24 hour  Intake    240 ml  Output   2009 ml  Net  -1769 ml    Exam  General: Well developed, thin, NAD, appears stated age  HEENT: NCAT, mucous membranes moist.   Cardiovascular: S1 S2 auscultated, RRR  Respiratory: Clear to auscultation bilaterally   Abdomen: Soft, nontender, nondistended, + bowel sounds  Extremities: warm dry without cyanosis clubbing, +2LE edema.   Neuro: AAOx2, nonfocal  Psych: Flat affect  Data Review   Micro Results No results found for this or any previous visit (from the past 240 hour(s)).  Radiology Reports Dg Chest 2 View  10/04/2015  CLINICAL DATA:  Altered mental status, hypertension, diabetes mellitus, end-stage renal disease EXAM: CHEST  2 VIEW COMPARISON:  05/28/2015 FINDINGS: RIGHT jugular dual-lumen central venous catheter with tip projecting over RIGHT atrium. Enlargement of cardiac silhouette with pulmonary vascular congestion. Tips of lung apices excluded. No definite acute infiltrate, pleural effusion or pneumothorax. Bones unremarkable. IMPRESSION: Enlargement of cardiac silhouette with pulmonary vascular congestion. No acute abnormalities. Electronically Signed   By: Ulyses SouthwardMark  Boles M.D.   On: 10/04/2015 16:02   Ct Head Wo Contrast  10/04/2015  CLINICAL DATA:  Frontal headache and blurred vision. Symptom duration  unspecified. EXAM: CT HEAD WITHOUT CONTRAST TECHNIQUE: Contiguous axial images were obtained from the base of the skull through the vertex without intravenous contrast. COMPARISON:  CT head 04/12/2013.  MR head 04/14/2013. FINDINGS: There is a large remote RIGHT MCA territory infarct affecting the RIGHT parietal lobe. There is well-defined encephalomalacia and compensatory enlargement of the atrium of the RIGHT lateral ventricle. No acute infarct, hemorrhage, mass lesion, hydrocephalus, or extra-axial fluid. No apparent white matter disease. Calvarium intact. No visible sinus or mastoid fluid. Advanced vascular calcification in the carotid siphons. BILATERAL TMJ arthritis. Compared with prior studies, this large infarct was not present. IMPRESSION: Large remote RIGHT MCA territory infarct. No acute intracranial findings. Electronically Signed   By: Elsie StainJohn T Curnes M.D.   On: 10/04/2015 19:47    CBC  Recent Labs Lab 10/04/15 1448 10/05/15 0453  WBC 12.6* 12.6*  HGB 11.2* 10.6*  HCT 35.7* 33.1*  PLT 330 262  MCV 79.7 76.3*  MCH 25.0* 24.4*  MCHC 31.4 32.0  RDW 18.9* 18.4*    Chemistries   Recent Labs Lab 10/04/15 1448 10/04/15 2208 10/05/15 0453  NA 147* 147* 144  K 4.0 3.9 2.7*  CL 116* 117* 110  CO2 <7* 7* 14*  GLUCOSE 111* 125* 126*  BUN 132* 138* 85*  CREATININE 15.98* 16.48* 11.33*  CALCIUM 6.9* 7.0* 6.9*   ------------------------------------------------------------------------------------------------------------------ estimated creatinine clearance is 4.4 mL/min (by C-G formula based on Cr of 11.33). ------------------------------------------------------------------------------------------------------------------ No results for input(s): HGBA1C in the last 72 hours. ------------------------------------------------------------------------------------------------------------------ No results for input(s): CHOL, HDL, LDLCALC, TRIG, CHOLHDL, LDLDIRECT in the last 72  hours. ------------------------------------------------------------------------------------------------------------------ No results for input(s): TSH, T4TOTAL, T3FREE, THYROIDAB in the last 72 hours.  Invalid input(s): FREET3 ------------------------------------------------------------------------------------------------------------------  Recent Labs  10/04/15 2208  TIBC 168*  IRON 144    Coagulation profile No results for input(s): INR, PROTIME in the last 168 hours.  No results for input(s): DDIMER in the last 72 hours.  Cardiac Enzymes No results for input(s): CKMB, TROPONINI, MYOGLOBIN in the last 168 hours.  Invalid input(s): CK ------------------------------------------------------------------------------------------------------------------ Invalid input(s): POCBNP    Nial Hawe D.O. on 10/05/2015 at 12:55 PM  Between 7am to 7pm - Pager - [phone]  After 7pm go to [email or web address] - password [email or web address]  And look for the night coverage person covering for me after hours  [facility] Office  [phone]

## 2015-10-05 NOTE — Progress Notes (Signed)
Patient: Lorain ChildesBeverly J Centner DOB: 05/24/1956 MRN: [id number]  December, 28, 2016  Hemodialysis treatment:  Duration: 3 hours Bath: 4K+  2.25Ca+ Blood flow: 250 Dialysate flow: 500 Access: RIJ UF profile: None Na modeling: N/A  UF: 1.7 L Post weight: 53 kg   No complications occurred  Filed Vitals:   10/05/15 1704 10/05/15 1709 10/05/15 1745 10/05/15 1800  BP: 142/80 148/78 142/83 139/80  Pulse: 63 60 65 72  Temp: 98 F (36.7 C)     TempSrc:      Resp: 10     Weight: 54.7 kg (120 lb 9.5 oz)     SpO2:        Results for orders placed or performed during the hospital encounter of 10/04/15 (from the past 24 hour(s))  Renal function panel     Status: Abnormal   Collection Time: 10/04/15 10:08 PM  Result Value Ref Range   Sodium 147 (H) 135 - 145 mmol/L   Potassium 3.9 3.5 - 5.1 mmol/L   Chloride 117 (H) 101 - 111 mmol/L   CO2 7 (L) 22 - 32 mmol/L   Glucose, Bld 125 (H) 65 - 99 mg/dL   BUN 782138 (H) 6 - 20 mg/dL   Creatinine, Ser 95.6216.48 (H) 0.44 - 1.00 mg/dL   Calcium 7.0 (L) 8.9 - 10.3 mg/dL   Phosphorus 13.015.2 (H) 2.5 - 4.6 mg/dL   Albumin 3.3 (L) 3.5 - 5.0 g/dL   GFR calc non Af Amer 2 (L) >60 mL/min   GFR calc Af Amer 2 (L) >60 mL/min   Anion gap 23 (H) 5 - 15  Iron and TIBC     Status: Abnormal   Collection Time: 10/04/15 10:08 PM  Result Value Ref Range   Iron 144 28 - 170 ug/dL   TIBC 865168 (L) 784250 - 696450 ug/dL   Saturation Ratios 86 (H) 10.4 - 31.8 %   UIBC 24 ug/dL  Glucose, capillary     Status: Abnormal   Collection Time: 10/05/15 12:06 AM  Result Value Ref Range   Glucose-Capillary 206 (H) 65 - 99 mg/dL  Renal function panel     Status: Abnormal   Collection Time: 10/05/15  4:53 AM  Result Value Ref Range   Sodium 144 135 - 145 mmol/L   Potassium 2.7 (LL) 3.5 - 5.1 mmol/L   Chloride 110 101 - 111 mmol/L   CO2 14 (L) 22 - 32 mmol/L   Glucose, Bld 126 (H) 65 - 99 mg/dL   BUN 85 (H) 6 - 20 mg/dL   Creatinine, Ser 29.5211.33 (H) 0.44 - 1.00 mg/dL   Calcium 6.9  (L) 8.9 - 10.3 mg/dL   Phosphorus 9.2 (H) 2.5 - 4.6 mg/dL   Albumin 3.1 (L) 3.5 - 5.0 g/dL   GFR calc non Af Amer 3 (L) >60 mL/min   GFR calc Af Amer 4 (L) >60 mL/min   Anion gap 20 (H) 5 - 15  CBC     Status: Abnormal   Collection Time: 10/05/15  4:53 AM  Result Value Ref Range   WBC 12.6 (H) 4.0 - 10.5 K/uL   RBC 4.34 3.87 - 5.11 MIL/uL   Hemoglobin 10.6 (L) 12.0 - 15.0 g/dL   HCT 84.133.1 (L) 32.436.0 - 40.146.0 %   MCV 76.3 (L) 78.0 - 100.0 fL   MCH 24.4 (L) 26.0 - 34.0 pg   MCHC 32.0 30.0 - 36.0 g/dL   RDW 02.718.4 (H) 25.311.5 - 66.415.5 %   Platelets 262 150 -  400 K/uL  Glucose, capillary     Status: Abnormal   Collection Time: 10/05/15  7:38 AM  Result Value Ref Range   Glucose-Capillary 179 (H) 65 - 99 mg/dL  Glucose, capillary     Status: Abnormal   Collection Time: 10/05/15 11:22 AM  Result Value Ref Range   Glucose-Capillary 101 (H) 65 - 99 mg/dL  Glucose, capillary     Status: Abnormal   Collection Time: 10/05/15  4:18 PM  Result Value Ref Range   Glucose-Capillary 120 (H) 65 - 99 mg/dL    Lab Results  Component Value Date   HEPBSAG Negative 05/31/2015   Lab Results  Component Value Date   HEPBSAG Negative 05/31/2015    [place] Shi  10/05/2015  6:15 PM

## 2015-10-05 NOTE — Progress Notes (Signed)
CRITICAL VALUE ALERT  Critical value received:  K 2.7  Date of notification:  10/05/2015  Time of notification:  1030  Critical value read back:Yes.    Nurse who received alert:  Janeann ForehandLuke Orenthal Debski  MD notified (1st page): Dr. Catha GosselinMikhail  Time of first page:  1115  MD notified (2nd page):  Time of second page:  Responding MD:  Dr. Catha GosselinMikhail  Time MD responded:  [phone]

## 2015-10-05 NOTE — Progress Notes (Signed)
Patient arrived to unit by bed.  Reviewed treatment plan and this RN agrees with plan.  Report received from bedside RN, Franky MachoLuke.  Consent verified.  Patient A & O x 4, d/o to situation.   Lung sounds diminished to ausculation in all fields. Generalized edema. Cardiac:  Regular R&R.  Removed caps and cleansed RIJ catheter with chlorhedxidine.  Aspirated ports of heparin and flushed them with saline per protocol.  Connected and secured lines, initiated treatment at 1709.  UF Goal of 2200mL and net fluid removal 1700mL.  Will continue to monitor.

## 2015-10-06 LAB — CBC
HCT: 36.4 % (ref 36.0–46.0)
HEMOGLOBIN: 11.8 g/dL — AB (ref 12.0–15.0)
MCH: 24.9 pg — AB (ref 26.0–34.0)
MCHC: 32.4 g/dL (ref 30.0–36.0)
MCV: 77 fL — ABNORMAL LOW (ref 78.0–100.0)
PLATELETS: 248 10*3/uL (ref 150–400)
RBC: 4.73 MIL/uL (ref 3.87–5.11)
RDW: 19 % — ABNORMAL HIGH (ref 11.5–15.5)
WBC: 11.2 10*3/uL — AB (ref 4.0–10.5)

## 2015-10-06 LAB — BASIC METABOLIC PANEL
ANION GAP: 13 (ref 5–15)
BUN: 45 mg/dL — AB (ref 6–20)
CHLORIDE: 107 mmol/L (ref 101–111)
CO2: 20 mmol/L — ABNORMAL LOW (ref 22–32)
Calcium: 6.8 mg/dL — ABNORMAL LOW (ref 8.9–10.3)
Creatinine, Ser: 7.45 mg/dL — ABNORMAL HIGH (ref 0.44–1.00)
GFR, EST AFRICAN AMERICAN: 6 mL/min — AB (ref >60)
GFR, EST NON AFRICAN AMERICAN: 5 mL/min — AB (ref >60)
Glucose, Bld: 97 mg/dL (ref 65–99)
POTASSIUM: 2.8 mmol/L — AB (ref 3.5–5.1)
SODIUM: 140 mmol/L (ref 135–145)

## 2015-10-06 LAB — HEPATITIS B SURFACE ANTIGEN: HEP B S AG: NEGATIVE

## 2015-10-06 LAB — GLUCOSE, CAPILLARY
GLUCOSE-CAPILLARY: 155 mg/dL — AB (ref 65–99)
GLUCOSE-CAPILLARY: 172 mg/dL — AB (ref 65–99)
GLUCOSE-CAPILLARY: 209 mg/dL — AB (ref 65–99)
Glucose-Capillary: 117 mg/dL — ABNORMAL HIGH (ref 65–99)

## 2015-10-06 LAB — PHOSPHORUS: PHOSPHORUS: 5.1 mg/dL — AB (ref 2.5–4.6)

## 2015-10-06 LAB — MAGNESIUM: MAGNESIUM: 1.9 mg/dL (ref 1.7–2.4)

## 2015-10-06 MED ORDER — POTASSIUM CHLORIDE CRYS ER 20 MEQ PO TBCR
40.0000 meq | EXTENDED_RELEASE_TABLET | Freq: Two times a day (BID) | ORAL | Status: DC
Start: 2015-10-06 — End: 2015-10-07
  Administered 2015-10-06 – 2015-10-07 (×3): 40 meq via ORAL
  Filled 2015-10-06 (×4): qty 2

## 2015-10-06 MED ORDER — CALCIUM CARBONATE 1250 (500 CA) MG PO TABS
1250.0000 mg | ORAL_TABLET | Freq: Two times a day (BID) | ORAL | Status: DC
  Administered 2015-10-06 – 2015-10-07 (×2): 1250 mg via ORAL
  Filled 2015-10-06 (×2): qty 1

## 2015-10-06 NOTE — Progress Notes (Signed)
[facility]                                                                              Patient Demographics  Jane Higgins, is a 59 y.o. female, DOB - 03/28/1956, ZOX:[id number]  Admit date - 10/04/2015   Admitting Physician Edsel PetrinMaryann Rande Roylance, DO  Outpatient Primary MD for the patient is No primary care provider on file.  LOS - 2   Chief Complaint  Patient presents with  . Altered Mental Status      HPI on 10/04/2015  [name] is a 59 y.o. female with a history of end-stage renal disease, diabetes mellitus, hypertension that presented to the emergency department with confusion. Patient was brought in by her family members. They state over the last several days, patient has become more more confused. This occurred when patient first dialysis. After questioning patient, his next dialysis for the past 2 weeks due to "viral illness". Patient could not be very specifically what type of illness or symptoms she was having. Currently patient denies any abdominal pain, nausea, vomiting, chest pain, dizziness or headache. She does complain of some shortness of breath. Patient states she did have nausea and vomiting, diarrhea and constipation over the past 2 weeks. In the emergency department, labs showed leukocytosis of 0.6, creatinine of 15.98, BUN of 132, CO2 less than 7, sodium 147, chloride 116. [facility] called for admission.  Assessment & Plan   Acute encephalopathy -CT head: Large remote Right MCA territory infarct, no acute intracranial findings -spoke with Dr Roseanne RenoStewart, neurology, regarding CT findings, CVA old, no further workup needed -likely secondary to missed dialysis/uremia; unlikely infectious- CXR neg -Chest x-ray shows pulmonary vascular congestion, no acute abnormalities -Patient currently afebrile although does have mild leukocytosis -Continues to be altered, AAOx2 -Continue to monitor closely  End-stage renal disease -Patient has missed dialysis for the last 2  weeks due to "viral illness" -Nephrology consulted and appreciated -Continue Renavit  Dyspnea -improving, Likely secondary to missed dialysis -Chest x-ray shows pulmonary vascular congestion  Leukocytosis -Likely reactive, will continue to monitor CBC  Hypernatremia -Resolved, Likely secondary to missed dialysis -Continue to monitor BMP  Diabetes mellitus, type II -Continue insulin sliding scale CBG monitoring -Last hemoglobin A1c 6.0 on 05/28/2015  Essential hypertension -Continue metoprolol and amlodipine  Malnutrition -Continue nutritional supplements  Lower extremity edema -Improving, Secondary to missed dialysis  Tobacco abuse -Continue nicotine patch  Hypokalemia -Being replaced, continue to monitor BMP -Magnesium level pending  Bipolar disorder/ schizophrenia -No longer on meds  Code Status: Full  Family Communication: None at bedside  Disposition Plan: Admitted. Continue HD.  Patient still altered but improving  Time Spent in minutes   30 minutes  Procedures  None  Consults   Nephrology Neurology, Dr. Roseanne RenoStewart, via phone  DVT Prophylaxis  heparin  Lab Results  Component Value Date   PLT 248 10/06/2015    Medications  Scheduled Meds: . amLODipine  5 mg Oral Daily  . calcium acetate  2,001 mg Oral TID WC  . calcium carbonate  1,250 mg Oral BID WC  . feeding supplement (NEPRO CARB STEADY)  237 mL Oral BID BM  . heparin  5,000 Units Subcutaneous  3 times per day  . insulin aspart  0-9 Units Subcutaneous TID WC  . metoprolol tartrate  25 mg Oral BID  . multivitamin  1 tablet Oral QHS  . nicotine  21 mg Transdermal Daily  . potassium chloride  40 mEq Oral BID  . sodium chloride  3 mL Intravenous Q12H   Continuous Infusions:  PRN Meds:.sodium chloride, acetaminophen **OR** acetaminophen, ondansetron **OR** ondansetron (ZOFRAN) IV, sodium chloride  Antibiotics    Anti-infectives    None      Subjective:   [facility] seen and  examined today in dialysis.  Has no complaints.  Denies chest pain, shortness of breath, abdominal pain, nausea/vomiting.  Objective:   Filed Vitals:   10/06/15 1030 10/06/15 1054 10/06/15 1105 10/06/15 1124  BP: 132/76 129/48 139/68 139/65  Pulse: 70 64 60 67  Temp:  96.7 F (35.9 C)  98.2 F (36.8 C)  TempSrc:  Oral  Oral  Resp:  22  20  Weight:  50.6 kg (111 lb 8.8 oz)    SpO2:  96%  98%    Wt Readings from Last 3 Encounters:  10/06/15 50.6 kg (111 lb 8.8 oz)  06/04/15 56.201 kg (123 lb 14.4 oz)  04/13/13 64.1 kg (141 lb 5 oz)     Intake/Output Summary (Last 24 hours) at 10/06/15 1255 Last data filed at 10/06/15 1126  Gross per 24 hour  Intake    480 ml  Output   3701 ml  Net  -3221 ml    Exam  General: Well developed, thin, NAD, appears stated age  HEENT: NCAT, mucous membranes moist.   Cardiovascular: S1 S2 auscultated, RRR  Respiratory: Clear to auscultation bilaterally   Abdomen: Soft, nontender, nondistended, + bowel sounds  Extremities: warm dry without cyanosis clubbing, Trace LE edema.   Neuro: AAOx2, nonfocal  Psych: Flat affect  Data Review   Micro Results No results found for this or any previous visit (from the past 240 hour(s)).  Radiology Reports Dg Chest 2 View  10/04/2015  CLINICAL DATA:  Altered mental status, hypertension, diabetes mellitus, end-stage renal disease EXAM: CHEST  2 VIEW COMPARISON:  05/28/2015 FINDINGS: RIGHT jugular dual-lumen central venous catheter with tip projecting over RIGHT atrium. Enlargement of cardiac silhouette with pulmonary vascular congestion. Tips of lung apices excluded. No definite acute infiltrate, pleural effusion or pneumothorax. Bones unremarkable. IMPRESSION: Enlargement of cardiac silhouette with pulmonary vascular congestion. No acute abnormalities. Electronically Signed   By: Ulyses Southward M.D.   On: 10/04/2015 16:02   Ct Head Wo Contrast  10/04/2015  CLINICAL DATA:  Frontal headache and blurred  vision. Symptom duration unspecified. EXAM: CT HEAD WITHOUT CONTRAST TECHNIQUE: Contiguous axial images were obtained from the base of the skull through the vertex without intravenous contrast. COMPARISON:  CT head 04/12/2013.  MR head 04/14/2013. FINDINGS: There is a large remote RIGHT MCA territory infarct affecting the RIGHT parietal lobe. There is well-defined encephalomalacia and compensatory enlargement of the atrium of the RIGHT lateral ventricle. No acute infarct, hemorrhage, mass lesion, hydrocephalus, or extra-axial fluid. No apparent white matter disease. Calvarium intact. No visible sinus or mastoid fluid. Advanced vascular calcification in the carotid siphons. BILATERAL TMJ arthritis. Compared with prior studies, this large infarct was not present. IMPRESSION: Large remote RIGHT MCA territory infarct. No acute intracranial findings. Electronically Signed   By: Elsie Stain M.D.   On: 10/04/2015 19:47    CBC  Recent Labs Lab 10/04/15 1448 10/05/15 0453 10/06/15 0548  WBC 12.6*  12.6* 11.2*  HGB 11.2* 10.6* 11.8*  HCT 35.7* 33.1* 36.4  PLT 330 262 248  MCV 79.7 76.3* 77.0*  MCH 25.0* 24.4* 24.9*  MCHC 31.4 32.0 32.4  RDW 18.9* 18.4* 19.0*    Chemistries   Recent Labs Lab 10/04/15 1448 10/04/15 2208 10/05/15 0453 10/06/15 0548  NA 147* 147* 144 140  K 4.0 3.9 2.7* 2.8*  CL 116* 117* 110 107  CO2 <7* 7* 14* 20*  GLUCOSE 111* 125* 126* 97  BUN 132* 138* 85* 45*  CREATININE 15.98* 16.48* 11.33* 7.45*  CALCIUM 6.9* 7.0* 6.9* 6.8*   ------------------------------------------------------------------------------------------------------------------ estimated creatinine clearance is 6.3 mL/min (by C-G formula based on Cr of 7.45). ------------------------------------------------------------------------------------------------------------------ No results for input(s): HGBA1C in the last 72  hours. ------------------------------------------------------------------------------------------------------------------ No results for input(s): CHOL, HDL, LDLCALC, TRIG, CHOLHDL, LDLDIRECT in the last 72 hours. ------------------------------------------------------------------------------------------------------------------ No results for input(s): TSH, T4TOTAL, T3FREE, THYROIDAB in the last 72 hours.  Invalid input(s): FREET3 ------------------------------------------------------------------------------------------------------------------  Recent Labs  10/04/15 2208  TIBC 168*  IRON 144    Coagulation profile No results for input(s): INR, PROTIME in the last 168 hours.  No results for input(s): DDIMER in the last 72 hours.  Cardiac Enzymes No results for input(s): CKMB, TROPONINI, MYOGLOBIN in the last 168 hours.  Invalid input(s): CK ------------------------------------------------------------------------------------------------------------------ Invalid input(s): POCBNP    Sumaiya Arruda D.O. on 10/06/2015 at 12:55 PM  Between 7am to 7pm - Pager - [phone]  After 7pm go to [email or web address] - password [email or web address]  And look for the night coverage person covering for me after hours  [facility] Office  [phone]

## 2015-10-06 NOTE — Progress Notes (Signed)
Utilization review completed. Qianna Clagett, RN, BSN. 
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
## 2015-10-07 LAB — RENAL FUNCTION PANEL
Albumin: 3.2 g/dL — ABNORMAL LOW (ref 3.5–5.0)
Anion gap: 15 (ref 5–15)
BUN: 30 mg/dL — ABNORMAL HIGH (ref 6–20)
CO2: 20 mmol/L — ABNORMAL LOW (ref 22–32)
Calcium: 6.9 mg/dL — ABNORMAL LOW (ref 8.9–10.3)
Chloride: 105 mmol/L (ref 101–111)
Creatinine, Ser: 4.91 mg/dL — ABNORMAL HIGH (ref 0.44–1.00)
GFR calc Af Amer: 10 mL/min — ABNORMAL LOW (ref >60)
GFR calc non Af Amer: 9 mL/min — ABNORMAL LOW (ref >60)
Glucose, Bld: 140 mg/dL — ABNORMAL HIGH (ref 65–99)
Phosphorus: 3.7 mg/dL (ref 2.5–4.6)
Potassium: 3.7 mmol/L (ref 3.5–5.1)
Sodium: 140 mmol/L (ref 135–145)

## 2015-10-07 LAB — CBC
HCT: 38.2 % (ref 36.0–46.0)
Hemoglobin: 11.9 g/dL — ABNORMAL LOW (ref 12.0–15.0)
MCH: 24.8 pg — ABNORMAL LOW (ref 26.0–34.0)
MCHC: 31.2 g/dL (ref 30.0–36.0)
MCV: 79.6 fL (ref 78.0–100.0)
PLATELETS: 196 10*3/uL (ref 150–400)
RBC: 4.8 MIL/uL (ref 3.87–5.11)
RDW: 18.9 % — AB (ref 11.5–15.5)
WBC: 14.6 10*3/uL — ABNORMAL HIGH (ref 4.0–10.5)

## 2015-10-07 LAB — GLUCOSE, CAPILLARY
GLUCOSE-CAPILLARY: 127 mg/dL — AB (ref 65–99)
GLUCOSE-CAPILLARY: 145 mg/dL — AB (ref 65–99)
GLUCOSE-CAPILLARY: 135 mg/dL — AB (ref 65–99)
GLUCOSE-CAPILLARY: 117 mg/dL — AB (ref 65–99)

## 2015-10-07 MED ORDER — DOXERCALCIFEROL 4 MCG/2ML IV SOLN
6.0000 ug | INTRAVENOUS | Status: DC
  Administered 2015-10-08: 6 ug via INTRAVENOUS
  Filled 2015-10-07: qty 4

## 2015-10-07 NOTE — Care Management Important Message (Signed)
Important Message  Patient Details  Name: Lorain ChildesBeverly J Dinse MRN: [id number] Date of Birth: 02/15/1956   Medicare Important Message Given:  Yes    Talea Manges P Enrico Eaddy 10/07/2015, 2:57 PM
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
## 2015-10-07 NOTE — Progress Notes (Signed)
[facility]                                                                              Patient Demographics  Jane Higgins, is a 59 y.o. female, DOB - 07/03/1956, [id number]  Admit date - 10/04/2015   Admitting Physician Edsel Petrin, DO  Outpatient Primary MD for the patient is No primary care provider on file.  LOS - 3   Chief Complaint  Patient presents with  . Altered Mental Status      HPI on 10/04/2015  Jane Higgins is a 59 y.o. female with a history of end-stage renal disease, diabetes mellitus, hypertension that presented to the emergency department with confusion. Patient was brought in by her family members. They state over the last several days, patient has become more more confused. This occurred when patient first dialysis. After questioning patient, his next dialysis for the past 2 weeks due to "viral illness". Patient could not be very specifically what type of illness or symptoms she was having. Currently patient denies any abdominal pain, nausea, vomiting, chest pain, dizziness or headache. She does complain of some shortness of breath. Patient states she did have nausea and vomiting, diarrhea and constipation over the past 2 weeks. In the emergency department, labs showed leukocytosis of 0.6, creatinine of 15.98, BUN of 132, CO2 less than 7, sodium 147, chloride 116. [facility] called for admission.  Assessment & Plan   Acute encephalopathy -Mildly improving, however AAOx2 -CT head: Large remote Right MCA territory infarct, no acute intracranial findings -spoke with Dr Roseanne Reno, neurology, regarding CT findings, CVA old, no further workup needed -likely secondary to missed dialysis/uremia; unlikely infectious- CXR neg -Chest x-ray shows pulmonary vascular congestion, no acute abnormalities -Patient currently afebrile although does have mild leukocytosis -Continue to monitor closely  End-stage renal disease -Patient has missed dialysis for the last 2  weeks due to "viral illness" -Nephrology consulted and appreciated -Continue Renavit -Plan for next HD session on 10/08/2015  Dyspnea -improving, Likely secondary to missed dialysis -Chest x-ray shows pulmonary vascular congestion  Leukocytosis -Likely reactive, will continue to monitor CBC -Blood cultures pending  Hypernatremia -Resolved, Likely secondary to missed dialysis -Continue to monitor BMP  Diabetes mellitus, type II -Continue insulin sliding scale CBG monitoring -Last hemoglobin A1c 6.0 on 05/28/2015  Essential hypertension -Continue metoprolol and amlodipine  Malnutrition -Continue nutritional supplements  Lower extremity edema -Improving, Secondary to missed dialysis  Tobacco abuse -Continue nicotine patch  Hypokalemia -Being replaced, continue to monitor BMP -Magnesium level pending  Bipolar disorder/ schizophrenia -No longer on meds -Psychiatry consulted and appreciated  Code Status: Full  Family Communication: None at bedside  Disposition Plan: Admitted. Pending further HD and psych consult  Time Spent in minutes   30 minutes  Procedures  None  Consults   Nephrology Neurology, Dr. Roseanne Reno, via phone Psychiatry  DVT Prophylaxis  heparin  Lab Results  Component Value Date   PLT 196 10/07/2015    Medications  Scheduled Meds: . amLODipine  5 mg Oral Daily  . calcium acetate  2,001 mg Oral TID WC  . calcium carbonate  1,250 mg Oral BID WC  . feeding supplement (NEPRO CARB STEADY)  237 mL Oral BID BM  . heparin  5,000 Units Subcutaneous 3 times per day  . insulin aspart  0-9 Units Subcutaneous TID WC  . metoprolol tartrate  25 mg Oral BID  . multivitamin  1 tablet Oral QHS  . nicotine  21 mg Transdermal Daily  . potassium chloride  40 mEq Oral BID  . sodium chloride  3 mL Intravenous Q12H   Continuous Infusions:  PRN Meds:.sodium chloride, acetaminophen **OR** acetaminophen, ondansetron **OR** ondansetron (ZOFRAN) IV, sodium  chloride  Antibiotics    Anti-infectives    None      Subjective:   [facility] seen and examined today.  Has no complaints.  Denies chest pain, shortness of breath, abdominal pain, nausea/vomiting.  Objective:   Filed Vitals:   10/06/15 1752 10/06/15 2114 10/07/15 0500 10/07/15 0836  BP: 132/77 125/53 158/62 143/58  Pulse: 67 71 63 68  Temp: 98 F (36.7 C) 98.8 F (37.1 C) 97.8 F (36.6 C) 97.7 F (36.5 C)  TempSrc: Oral Oral Oral Oral  Resp: Weight:  50.6 kg (111 lb 8.8 oz)    SpO2: 98% 97% 96% 100%    Wt Readings from Last 3 Encounters:  10/06/15 50.6 kg (111 lb 8.8 oz)  06/04/15 56.201 kg (123 lb 14.4 oz)  04/13/13 64.1 kg (141 lb 5 oz)     Intake/Output Summary (Last 24 hours) at 10/07/15 1211 Last data filed at 10/07/15 1057  Gross per 24 hour  Intake   1683 ml  Output      0 ml  Net   1683 ml    Exam  General: Well developed, thin, NAD  HEENT: NCAT, mucous membranes moist.   Cardiovascular: S1 S2 auscultated, RRR  Respiratory: Clear to auscultation bilaterally   Abdomen: Soft, nontender, nondistended, + bowel sounds  Extremities: warm dry without cyanosis clubbing, Trace LE edema.   Neuro: AAOx2, nonfocal  Psych: Flat affect  Data Review   Micro Results No results found for this or any previous visit (from the past 240 hour(s)).  Radiology Reports Dg Chest 2 View  10/04/2015  CLINICAL DATA:  Altered mental status, hypertension, diabetes mellitus, end-stage renal disease EXAM: CHEST  2 VIEW COMPARISON:  05/28/2015 FINDINGS: RIGHT jugular dual-lumen central venous catheter with tip projecting over RIGHT atrium. Enlargement of cardiac silhouette with pulmonary vascular congestion. Tips of lung apices excluded. No definite acute infiltrate, pleural effusion or pneumothorax. Bones unremarkable. IMPRESSION: Enlargement of cardiac silhouette with pulmonary vascular congestion. No acute abnormalities. Electronically Signed   By:  Ulyses Southward M.D.   On: 10/04/2015 16:02   Ct Head Wo Contrast  10/04/2015  CLINICAL DATA:  Frontal headache and blurred vision. Symptom duration unspecified. EXAM: CT HEAD WITHOUT CONTRAST TECHNIQUE: Contiguous axial images were obtained from the base of the skull through the vertex without intravenous contrast. COMPARISON:  CT head 04/12/2013.  MR head 04/14/2013. FINDINGS: There is a large remote RIGHT MCA territory infarct affecting the RIGHT parietal lobe. There is well-defined encephalomalacia and compensatory enlargement of the atrium of the RIGHT lateral ventricle. No acute infarct, hemorrhage, mass lesion, hydrocephalus, or extra-axial fluid. No apparent white matter disease. Calvarium intact. No visible sinus or mastoid fluid. Advanced vascular calcification in the carotid siphons. BILATERAL TMJ arthritis. Compared with prior studies, this large infarct was not present. IMPRESSION: Large remote RIGHT MCA territory infarct. No acute intracranial findings. Electronically Signed   By: Elsie Stain M.D.   On: 10/04/2015 19:47  CBC  Recent Labs Lab 10/04/15 1448 10/05/15 0453 10/06/15 0548 10/07/15 0800  WBC 12.6* 12.6* 11.2* 14.6*  HGB 11.2* 10.6* 11.8* 11.9*  HCT 35.7* 33.1* 36.4 38.2  PLT 330 262 248 196  MCV 79.7 76.3* 77.0* 79.6  MCH 25.0* 24.4* 24.9* 24.8*  MCHC 31.4 32.0 32.4 31.2  RDW 18.9* 18.4* 19.0* 18.9*    Chemistries   Recent Labs Lab 10/04/15 1448 10/04/15 2208 10/05/15 0453 10/06/15 0548 10/07/15 0800  NA 147* 147* 144 140 140  K 4.0 3.9 2.7* 2.8* 3.7  CL 116* 117* 110 107 105  CO2 <7* 7* 14* 20* 20*  GLUCOSE 111* 125* 126* 97 140*  BUN 132* 138* 85* 45* 30*  CREATININE 15.98* 16.48* 11.33* 7.45* 4.91*  CALCIUM 6.9* 7.0* 6.9* 6.8* 6.9*  MG  --   --   --  1.9  --    ------------------------------------------------------------------------------------------------------------------ estimated creatinine clearance is 9.6 mL/min (by C-G formula based on Cr  of 4.91). ------------------------------------------------------------------------------------------------------------------ No results for input(s): HGBA1C in the last 72 hours. ------------------------------------------------------------------------------------------------------------------ No results for input(s): CHOL, HDL, LDLCALC, TRIG, CHOLHDL, LDLDIRECT in the last 72 hours. ------------------------------------------------------------------------------------------------------------------ No results for input(s): TSH, T4TOTAL, T3FREE, THYROIDAB in the last 72 hours.  Invalid input(s): FREET3 ------------------------------------------------------------------------------------------------------------------  Recent Labs  10/04/15 2208  TIBC 168*  IRON 144    Coagulation profile No results for input(s): INR, PROTIME in the last 168 hours.  No results for input(s): DDIMER in the last 72 hours.  Cardiac Enzymes No results for input(s): CKMB, TROPONINI, MYOGLOBIN in the last 168 hours.  Invalid input(s): CK ------------------------------------------------------------------------------------------------------------------ Invalid input(s): POCBNP    Hakan Nudelman D.O. on 10/07/2015 at 12:11 PM  Between 7am to 7pm - Pager - [phone]  After 7pm go to [email or web address] - password [email or web address]  And look for the night coverage person covering for me after hours  [facility] Office  [phone]

## 2015-10-08 LAB — CBC
HCT: 32.8 % — ABNORMAL LOW (ref 36.0–46.0)
HCT: 33 % — ABNORMAL LOW (ref 36.0–46.0)
Hemoglobin: 10.4 g/dL — ABNORMAL LOW (ref 12.0–15.0)
Hemoglobin: 10 g/dL — ABNORMAL LOW (ref 12.0–15.0)
MCH: 25.1 pg — ABNORMAL LOW (ref 26.0–34.0)
MCH: 24.2 pg — ABNORMAL LOW (ref 26.0–34.0)
MCHC: 31.7 g/dL (ref 30.0–36.0)
MCHC: 30.3 g/dL (ref 30.0–36.0)
MCV: 79.2 fL (ref 78.0–100.0)
MCV: 79.7 fL (ref 78.0–100.0)
PLATELETS: 165 10*3/uL (ref 150–400)
Platelets: 175 10*3/uL (ref 150–400)
RBC: 4.14 MIL/uL (ref 3.87–5.11)
RBC: 4.14 MIL/uL (ref 3.87–5.11)
RDW: 18.6 % — AB (ref 11.5–15.5)
RDW: 18.5 % — ABNORMAL HIGH (ref 11.5–15.5)
WBC: 11.7 10*3/uL — ABNORMAL HIGH (ref 4.0–10.5)
WBC: 11.7 10*3/uL — ABNORMAL HIGH (ref 4.0–10.5)

## 2015-10-08 LAB — RENAL FUNCTION PANEL
Albumin: 3 g/dL — ABNORMAL LOW (ref 3.5–5.0)
Anion gap: 12 (ref 5–15)
BUN: 50 mg/dL — ABNORMAL HIGH (ref 6–20)
CO2: 21 mmol/L — ABNORMAL LOW (ref 22–32)
Calcium: 6.6 mg/dL — ABNORMAL LOW (ref 8.9–10.3)
Chloride: 102 mmol/L (ref 101–111)
Creatinine, Ser: 6.05 mg/dL — ABNORMAL HIGH (ref 0.44–1.00)
GFR calc Af Amer: 8 mL/min — ABNORMAL LOW (ref >60)
GFR calc non Af Amer: 7 mL/min — ABNORMAL LOW (ref >60)
Glucose, Bld: 109 mg/dL — ABNORMAL HIGH (ref 65–99)
Phosphorus: 4.5 mg/dL (ref 2.5–4.6)
Potassium: 3.3 mmol/L — ABNORMAL LOW (ref 3.5–5.1)
Sodium: 135 mmol/L (ref 135–145)

## 2015-10-08 LAB — BASIC METABOLIC PANEL
Anion gap: 10 (ref 5–15)
BUN: 50 mg/dL — AB (ref 6–20)
CALCIUM: 6.6 mg/dL — AB (ref 8.9–10.3)
CO2: 23 mmol/L (ref 22–32)
CREATININE: 6.02 mg/dL — AB (ref 0.44–1.00)
Chloride: 104 mmol/L (ref 101–111)
GFR calc non Af Amer: 7 mL/min — ABNORMAL LOW (ref >60)
GFR, EST AFRICAN AMERICAN: 8 mL/min — AB (ref >60)
Glucose, Bld: 110 mg/dL — ABNORMAL HIGH (ref 65–99)
Potassium: 3.3 mmol/L — ABNORMAL LOW (ref 3.5–5.1)
SODIUM: 137 mmol/L (ref 135–145)

## 2015-10-08 LAB — GLUCOSE, CAPILLARY
GLUCOSE-CAPILLARY: 106 mg/dL — AB (ref 65–99)
GLUCOSE-CAPILLARY: 161 mg/dL — AB (ref 65–99)
Glucose-Capillary: 120 mg/dL — ABNORMAL HIGH (ref 65–99)

## 2015-10-08 MED ORDER — SODIUM CHLORIDE 0.9 % IV SOLN: 100.0000 mL | INTRAVENOUS | Status: DC | PRN

## 2015-10-08 MED ORDER — HEPARIN SODIUM (PORCINE) 1000 UNIT/ML DIALYSIS: 1000.0000 [IU] | INTRAMUSCULAR | Status: DC | PRN

## 2015-10-08 MED ORDER — LIDOCAINE-PRILOCAINE 2.5-2.5 % EX CREA
1.0000 "application " | TOPICAL_CREAM | CUTANEOUS | Status: DC | PRN
  Filled 2015-10-08: qty 5

## 2015-10-08 MED ORDER — LIDOCAINE HCL (PF) 1 % IJ SOLN
5.0000 mL | INTRAMUSCULAR | Status: DC | PRN
Start: 2015-10-08 — End: 2015-10-08

## 2015-10-08 MED ORDER — ALTEPLASE 2 MG IJ SOLR
2.0000 mg | Freq: Once | INTRAMUSCULAR | Status: DC | PRN
  Filled 2015-10-08: qty 2

## 2015-10-08 MED ORDER — DOXERCALCIFEROL 4 MCG/2ML IV SOLN
INTRAVENOUS | Status: AC
Start: 2015-10-08 — End: 2015-10-08
  Administered 2015-10-08: 6 ug via INTRAVENOUS
  Filled 2015-10-08: qty 4

## 2015-10-08 MED ORDER — PENTAFLUOROPROP-TETRAFLUOROETH EX AERO: 1.0000 "application " | INHALATION_SPRAY | CUTANEOUS | Status: DC | PRN

## 2015-10-08 NOTE — Progress Notes (Signed)
[facility]                                                                              Patient Demographics  Jane Higgins, is a 59 y.o. female, DOB - [birth year], [id number]  Admit date - 10/04/2015   Admitting Physician Edsel Petrin, DO  Outpatient Primary MD for the patient is No primary care provider on file.  LOS - 4   Chief Complaint  Patient presents with  . Altered Mental Status      HPI on 10/04/2015  Jane Higgins is a 59 y.o. female with a history of end-stage renal disease, diabetes mellitus, hypertension that presented to the emergency department with confusion. Patient was brought in by her family members. They state over the last several days, patient has become more more confused. This occurred when patient first dialysis. After questioning patient, his next dialysis for the past 2 weeks due to "viral illness". Patient could not be very specifically what type of illness or symptoms she was having. Currently patient denies any abdominal pain, nausea, vomiting, chest pain, dizziness or headache. She does complain of some shortness of breath. Patient states she did have nausea and vomiting, diarrhea and constipation over the past 2 weeks. In the emergency department, labs showed leukocytosis of 0.6, creatinine of 15.98, BUN of 132, CO2 less than 7, sodium 147, chloride 116. [facility] called for admission.  Assessment & Plan   Acute encephalopathy -Mildly improving, however AAOx2 (wondering if this is patient's baseline) -CT head: Large remote Right MCA territory infarct, no acute intracranial findings -spoke with Dr Roseanne Reno, neurology, regarding CT findings, CVA old, no further workup needed -likely secondary to missed dialysis/uremia; unlikely infectious- CXR neg -Chest x-ray shows pulmonary vascular congestion, no acute abnormalities -Patient currently afebrile although does have mild leukocytosis, which has improved -Continue to monitor closely  End-stage  renal disease -Patient has missed dialysis for the last 2 weeks due to "viral illness" -Nephrology consulted and appreciated -Continue Renavit -HD today 10/08/2015  Dyspnea -improving, Likely secondary to missed dialysis -Chest x-ray shows pulmonary vascular congestion  Leukocytosis -Likely reactive, will continue to monitor CBC -Blood cultures show no growth to date  Hypernatremia -Resolved, Likely secondary to missed dialysis -Continue to monitor BMP  Anemia of chronic disease -Currently Hb 10, continue to monitor CBC  Diabetes mellitus, type II -Continue insulin sliding scale CBG monitoring -Last hemoglobin A1c 6.0 on 05/28/2015  Essential hypertension -Continue metoprolol and amlodipine  Malnutrition -Continue nutritional supplements  Lower extremity edema -Improving, Secondary to missed dialysis  Tobacco abuse -Continue nicotine patch  Hypokalemia -Being replaced, continue to monitor BMP -Magnesium 1.9  Bipolar disorder/ schizophrenia -No longer on meds -Psychiatry consulted and appreciated -Asked patient if I could speak with her family, she refused.  States "I am the boss, my son tells lies about me."  Goals of care -Not sure if patient will take medications or go to HD upon discharge.   -Tried to reach family, no response.  Code Status: Full  Family Communication: None at bedside  Disposition Plan: Admitted. Pending further HD and psych consult  Time Spent in minutes   30 minutes  Procedures  None  Consults  Nephrology Neurology, Dr. Roseanne Reno, via phone Psychiatry  DVT Prophylaxis  heparin  Lab Results  Component Value Date   PLT 175 10/08/2015    Medications  Scheduled Meds: . amLODipine  5 mg Oral Daily  . calcium acetate  2,001 mg Oral TID WC  . doxercalciferol  6 mcg Intravenous Q T,Th,Sa-HD  . heparin  5,000 Units Subcutaneous 3 times per day  . insulin aspart  0-9 Units Subcutaneous TID WC  . metoprolol tartrate  25 mg  Oral BID  . multivitamin  1 tablet Oral QHS  . nicotine  21 mg Transdermal Daily  . sodium chloride  3 mL Intravenous Q12H   Continuous Infusions:  PRN Meds:.sodium chloride, sodium chloride, sodium chloride, acetaminophen **OR** acetaminophen, alteplase, heparin, lidocaine (PF), lidocaine-prilocaine, ondansetron **OR** ondansetron (ZOFRAN) IV, pentafluoroprop-tetrafluoroeth, sodium chloride  Antibiotics    Anti-infectives    None      Subjective:   Jane Higgins seen and examined today in dialysis.  Has no complaints.  Denies chest pain, shortness of breath, abdominal pain, nausea/vomiting.  Objective:   Filed Vitals:   10/08/15 1000 10/08/15 1030 10/08/15 1100 10/08/15 1128  BP: 149/69 152/88 141/58 138/72  Pulse: 73 74 79 69  Temp:   97 F (36.1 C)   TempSrc:   Oral   Resp:   16   Weight:   49.3 kg (108 lb 11 oz)   SpO2:   96%     Wt Readings from Last 3 Encounters:  10/08/15 49.3 kg (108 lb 11 oz)  06/04/15 56.201 kg (123 lb 14.4 oz)  04/13/13 64.1 kg (141 lb 5 oz)     Intake/Output Summary (Last 24 hours) at 10/08/15 1139 Last data filed at 10/08/15 1100  Gross per 24 hour  Intake    700 ml  Output   2002 ml  Net  -1302 ml    Exam  General: Well developed, thin, NAD  HEENT: NCAT, mucous membranes moist.   Cardiovascular: S1 S2 auscultated, RRR  Respiratory: Clear to auscultation bilaterally   Abdomen: Soft, nontender, nondistended, + bowel sounds  Extremities: warm dry without cyanosis clubbing, Trace LE edema.   Neuro: AAOx2 (self, president), nonfocal  Psych: Flat affect  Data Review   Micro Results Recent Results (from the past 240 hour(s))  Culture, blood (routine x 2)     Status: None (Preliminary result)   Collection Time: 10/07/15  2:00 PM  Result Value Ref Range Status   Specimen Description BLOOD LEFT ANTECUBITAL  Final   Special Requests BOTTLES DRAWN AEROBIC AND ANAEROBIC 10CC  Final   Culture NO GROWTH < 24 HOURS  Final    Report Status PENDING  Incomplete  Culture, blood (routine x 2)     Status: None (Preliminary result)   Collection Time: 10/07/15  2:10 PM  Result Value Ref Range Status   Specimen Description BLOOD BLOOD LEFT HAND  Final   Special Requests BOTTLES DRAWN AEROBIC AND ANAEROBIC 10CC  Final   Culture NO GROWTH < 24 HOURS  Final   Report Status PENDING  Incomplete    Radiology Reports Dg Chest 2 View  10/04/2015  CLINICAL DATA:  Altered mental status, hypertension, diabetes mellitus, end-stage renal disease EXAM: CHEST  2 VIEW COMPARISON:  05/28/2015 FINDINGS: RIGHT jugular dual-lumen central venous catheter with tip projecting over RIGHT atrium. Enlargement of cardiac silhouette with pulmonary vascular congestion. Tips of lung apices excluded. No definite acute infiltrate, pleural effusion or pneumothorax. Bones unremarkable. IMPRESSION: Enlargement of cardiac  silhouette with pulmonary vascular congestion. No acute abnormalities. Electronically Signed   By: Ulyses SouthwardMark  Boles M.D.   On: 10/04/2015 16:02   Ct Head Wo Contrast  10/04/2015  CLINICAL DATA:  Frontal headache and blurred vision. Symptom duration unspecified. EXAM: CT HEAD WITHOUT CONTRAST TECHNIQUE: Contiguous axial images were obtained from the base of the skull through the vertex without intravenous contrast. COMPARISON:  CT head 04/12/2013.  MR head 04/14/2013. FINDINGS: There is a large remote RIGHT MCA territory infarct affecting the RIGHT parietal lobe. There is well-defined encephalomalacia and compensatory enlargement of the atrium of the RIGHT lateral ventricle. No acute infarct, hemorrhage, mass lesion, hydrocephalus, or extra-axial fluid. No apparent white matter disease. Calvarium intact. No visible sinus or mastoid fluid. Advanced vascular calcification in the carotid siphons. BILATERAL TMJ arthritis. Compared with prior studies, this large infarct was not present. IMPRESSION: Large remote RIGHT MCA territory infarct. No acute  intracranial findings. Electronically Signed   By: Elsie StainJohn T Curnes M.D.   On: 10/04/2015 19:47    CBC  Recent Labs Lab 10/05/15 0453 10/06/15 0548 10/07/15 0800 10/08/15 0559 10/08/15 0712  WBC 12.6* 11.2* 14.6* 11.7* 11.7*  HGB 10.6* 11.8* 11.9* 10.4* 10.0*  HCT 33.1* 36.4 38.2 32.8* 33.0*  PLT 262 248 196 165 175  MCV 76.3* 77.0* 79.6 79.2 79.7  MCH 24.4* 24.9* 24.8* 25.1* 24.2*  MCHC 32.0 32.4 31.2 31.7 30.3  RDW 18.4* 19.0* 18.9* 18.6* 18.5*    Chemistries   Recent Labs Lab 10/05/15 0453 10/06/15 0548 10/07/15 0800 10/08/15 0559 10/08/15 0712  NA 144 140 140 137 135  K 2.7* 2.8* 3.7 3.3* 3.3*  CL 110 107 105 104 102  CO2 14* 20* 20* 23 21*  GLUCOSE 126* 97 140* 110* 109*  BUN 85* 45* 30* 50* 50*  CREATININE 11.33* 7.45* 4.91* 6.02* 6.05*  CALCIUM 6.9* 6.8* 6.9* 6.6* 6.6*  MG  --  1.9  --   --   --    ------------------------------------------------------------------------------------------------------------------ estimated creatinine clearance is 7.6 mL/min (by C-G formula based on Cr of 6.05). ------------------------------------------------------------------------------------------------------------------ No results for input(s): HGBA1C in the last 72 hours. ------------------------------------------------------------------------------------------------------------------ No results for input(s): CHOL, HDL, LDLCALC, TRIG, CHOLHDL, LDLDIRECT in the last 72 hours. ------------------------------------------------------------------------------------------------------------------ No results for input(s): TSH, T4TOTAL, T3FREE, THYROIDAB in the last 72 hours.  Invalid input(s): FREET3 ------------------------------------------------------------------------------------------------------------------ No results for input(s): VITAMINB12, FOLATE, FERRITIN, TIBC, IRON, RETICCTPCT in the last 72 hours.  Coagulation profile No results for input(s): INR, PROTIME in the last  168 hours.  No results for input(s): DDIMER in the last 72 hours.  Cardiac Enzymes No results for input(s): CKMB, TROPONINI, MYOGLOBIN in the last 168 hours.  Invalid input(s): CK ------------------------------------------------------------------------------------------------------------------ Invalid input(s): POCBNP    Ezra Denne D.O. on 10/08/2015 at 11:39 AM  Between 7am to 7pm - Pager - [phone]  After 7pm go to [email or web address] - password [email or web address]  And look for the night coverage person covering for me after hours  [facility] Office  [phone]

## 2015-10-08 NOTE — Consult Note (Signed)
[facility] Face-to-Face Psychiatry Consult   Reason for Consult:  History of mental illness  Referring Physician:  Dr. Catha Gosselin Patient Identification: Jane Higgins MRN:  [id number] Principal Diagnosis: Acute encephalopathy Diagnosis:   Patient Active Problem List   Diagnosis Date Noted  . Uremia [N19] 10/04/2015  . Acute encephalopathy [G93.40] 10/04/2015  . Tobacco abuse [Z72.0] 10/04/2015  . Dyspnea [R06.00] 10/04/2015  . Malnutrition of moderate degree (HCC) [E44.0] 05/30/2015  . ESRD needing dialysis (HCC) [N18.6]   . ARF (acute renal failure) (HCC) [N17.9] 05/28/2015  . Renal failure [N19] 05/28/2015  . Hypokalemia [E87.6] 05/28/2015  . Weight loss [R63.4] 05/28/2015  . DM2 (diabetes mellitus, type 2) (HCC) [E11.9] 05/28/2015  . Anemia [D64.9] 05/28/2015  . Schizophrenia (HCC) [F20.9] 04/16/2013  . Acute respiratory failure (HCC) [J96.00] 04/13/2013  . Generalized seizures (HCC) [R56.9] 04/12/2013  . Hypertensive urgency [I16.0] 04/12/2013  . Hyperglycemia [R73.9] 04/12/2013    Total Time spent with patient: 30 minutes  Subjective:   Jane Higgins is a 59 y.o. female patient admitted with worsening confusion.  HPI:   59 year old female, who lives with adult son. She has a history of ESRD and is on hemodyalisis. Was admitted for mental status changes/ confusion, which may have been related to uremia in the context of sub-optimal compliance with hemodyalisis .  Patient currently improving  And at this time presents alert,  more attentive, with  improved orientation. Chart indicates history of Schizophrenia / Bipolar Disorder and a remote history of taking Risperidone . Patient states " that was many years ago", reports she had at least one episode of hallucinations, but denies any psychotic symptoms for " a long time now ". She states she has not been on any psychiatric medications for many years.  At this time denies any history of mania, hypomania or of severe depressive  episodes .  Past Psychiatric History: Chart indicates history of remote diagnosis of Schizophrenia and treatment with Risperidone- as above, patient states this was many years ago and has not been on any psychiatric medications or treatment for years. Denies history of Mood Disorder. Denies any history of suicide attempts .  Risk to Self: Is patient at risk for suicide?: No Risk to Others:   Prior Inpatient Therapy:   Prior Outpatient Therapy:    Past Medical History:  Past Medical History  Diagnosis Date  . Bipolar 1 disorder (HCC)   . Hypertension   . Diabetes mellitus without complication (HCC)   . ESRD (end stage renal disease) [facility])     Past Surgical History  Procedure Laterality Date  . Tubal ligation    . Av fistula placement Right 06/03/2015    Procedure: ARTERIOVENOUS (AV) FISTULA CREATION ;  Surgeon: Larina Earthly, MD;  Location: [facility] OR;  Service: Vascular;  Laterality: Right;   Family History:  Family History  Problem Relation Age of Onset  . Colon cancer      Neg history  . Kidney disease Cousin     Social History:  History  Alcohol Use No    Comment: no     History  Drug Use No    Social History   Social History  . Marital Status: Widowed    Spouse Name: N/A  . Number of Children: N/A  . Years of Education: N/A   Social History Main Topics  . Smoking status: Current Every Day Smoker -- 1.00 packs/day    Types: Cigarettes  . Smokeless tobacco: None  .  Alcohol Use: No     Comment: no  . Drug Use: No  . Sexual Activity: Not Asked   Other Topics Concern  . None   Social History Narrative   Additional Social History:                          Allergies:   Allergies  Allergen Reactions  . Iodine Other (See Comments)    unknown    Labs:  Results for orders placed or performed during the hospital encounter of 10/04/15 (from the past 48 hour(s))  Glucose, capillary     Status: Abnormal   Collection Time: 10/06/15  9:11 PM  Result  Value Ref Range   Glucose-Capillary 117 (H) 65 - 99 mg/dL  Glucose, capillary     Status: Abnormal   Collection Time: 10/07/15  7:30 AM  Result Value Ref Range   Glucose-Capillary 127 (H) 65 - 99 mg/dL  Renal function panel     Status: Abnormal   Collection Time: 10/07/15  8:00 AM  Result Value Ref Range   Sodium 140 135 - 145 mmol/L   Potassium 3.7 3.5 - 5.1 mmol/L   Chloride 105 101 - 111 mmol/L   CO2 20 (L) 22 - 32 mmol/L   Glucose, Bld 140 (H) 65 - 99 mg/dL   BUN 30 (H) 6 - 20 mg/dL   Creatinine, Ser 4.91 (H) 0.44 - 1.00 mg/dL    Comment: DELTA CHECK NOTED   Calcium 6.9 (L) 8.9 - 10.3 mg/dL   Phosphorus 3.7 2.5 - 4.6 mg/dL   Albumin 3.2 (L) 3.5 - 5.0 g/dL   GFR calc non Af Amer 9 (L) >60 mL/min   GFR calc Af Amer 10 (L) >60 mL/min    Comment: (NOTE) The eGFR has been calculated using the CKD EPI equation. This calculation has not been validated in all clinical situations. eGFR's persistently <60 mL/min signify possible Chronic Kidney Disease.    Anion gap 15 5 - 15  CBC     Status: Abnormal   Collection Time: 10/07/15  8:00 AM  Result Value Ref Range   WBC 14.6 (H) 4.0 - 10.5 K/uL   RBC 4.80 3.87 - 5.11 MIL/uL   Hemoglobin 11.9 (L) 12.0 - 15.0 g/dL   HCT 38.2 36.0 - 46.0 %   MCV 79.6 78.0 - 100.0 fL   MCH 24.8 (L) 26.0 - 34.0 pg   MCHC 31.2 30.0 - 36.0 g/dL   RDW 18.9 (H) 11.5 - 15.5 %   Platelets 196 150 - 400 K/uL  Glucose, capillary     Status: Abnormal   Collection Time: 10/07/15 11:18 AM  Result Value Ref Range   Glucose-Capillary 145 (H) 65 - 99 mg/dL  Culture, blood (routine x 2)     Status: None (Preliminary result)   Collection Time: 10/07/15  2:00 PM  Result Value Ref Range   Specimen Description BLOOD LEFT ANTECUBITAL    Special Requests BOTTLES DRAWN AEROBIC AND ANAEROBIC 10CC    Culture NO GROWTH < 24 HOURS    Report Status PENDING   Culture, blood (routine x 2)     Status: None (Preliminary result)   Collection Time: 10/07/15  2:10 PM  Result  Value Ref Range   Specimen Description BLOOD BLOOD LEFT HAND    Special Requests BOTTLES DRAWN AEROBIC AND ANAEROBIC 10CC    Culture NO GROWTH < 24 HOURS    Report Status PENDING   Glucose,  capillary     Status: Abnormal   Collection Time: 10/07/15  5:07 PM  Result Value Ref Range   Glucose-Capillary 135 (H) 65 - 99 mg/dL  Glucose, capillary     Status: Abnormal   Collection Time: 10/07/15  8:01 PM  Result Value Ref Range   Glucose-Capillary 117 (H) 65 - 99 mg/dL  CBC     Status: Abnormal   Collection Time: 10/08/15  5:59 AM  Result Value Ref Range   WBC 11.7 (H) 4.0 - 10.5 K/uL   RBC 4.14 3.87 - 5.11 MIL/uL   Hemoglobin 10.4 (L) 12.0 - 15.0 g/dL   HCT 65.9 (L) 66.0 - 72.1 %   MCV 79.2 78.0 - 100.0 fL   MCH 25.1 (L) 26.0 - 34.0 pg   MCHC 31.7 30.0 - 36.0 g/dL   RDW 03.7 (H) 48.3 - 64.1 %   Platelets 165 150 - 400 K/uL  Basic metabolic panel     Status: Abnormal   Collection Time: 10/08/15  5:59 AM  Result Value Ref Range   Sodium 137 135 - 145 mmol/L   Potassium 3.3 (L) 3.5 - 5.1 mmol/L   Chloride 104 101 - 111 mmol/L   CO2 23 22 - 32 mmol/L   Glucose, Bld 110 (H) 65 - 99 mg/dL   BUN 50 (H) 6 - 20 mg/dL   Creatinine, Ser 5.55 (H) 0.44 - 1.00 mg/dL   Calcium 6.6 (L) 8.9 - 10.3 mg/dL   GFR calc non Af Amer 7 (L) >60 mL/min   GFR calc Af Amer 8 (L) >60 mL/min    Comment: (NOTE) The eGFR has been calculated using the CKD EPI equation. This calculation has not been validated in all clinical situations. eGFR's persistently <60 mL/min signify possible Chronic Kidney Disease.    Anion gap 10 5 - 15  Renal function panel     Status: Abnormal   Collection Time: 10/08/15  7:12 AM  Result Value Ref Range   Sodium 135 135 - 145 mmol/L   Potassium 3.3 (L) 3.5 - 5.1 mmol/L   Chloride 102 101 - 111 mmol/L   CO2 21 (L) 22 - 32 mmol/L   Glucose, Bld 109 (H) 65 - 99 mg/dL   BUN 50 (H) 6 - 20 mg/dL   Creatinine, Ser 7.77 (H) 0.44 - 1.00 mg/dL   Calcium 6.6 (L) 8.9 - 10.3 mg/dL    Phosphorus 4.5 2.5 - 4.6 mg/dL   Albumin 3.0 (L) 3.5 - 5.0 g/dL   GFR calc non Af Amer 7 (L) >60 mL/min   GFR calc Af Amer 8 (L) >60 mL/min    Comment: (NOTE) The eGFR has been calculated using the CKD EPI equation. This calculation has not been validated in all clinical situations. eGFR's persistently <60 mL/min signify possible Chronic Kidney Disease.    Anion gap 12 5 - 15  CBC     Status: Abnormal   Collection Time: 10/08/15  7:12 AM  Result Value Ref Range   WBC 11.7 (H) 4.0 - 10.5 K/uL    Comment: WHITE COUNT CONFIRMED ON SMEAR   RBC 4.14 3.87 - 5.11 MIL/uL   Hemoglobin 10.0 (L) 12.0 - 15.0 g/dL   HCT 01.7 (L) 97.1 - 29.0 %   MCV 79.7 78.0 - 100.0 fL   MCH 24.2 (L) 26.0 - 34.0 pg   MCHC 30.3 30.0 - 36.0 g/dL   RDW 89.6 (H) 48.4 - 74.2 %   Platelets 175 150 - 400 K/uL  Comment: PLATELET COUNT CONFIRMED BY SMEAR  Glucose, capillary     Status: Abnormal   Collection Time: 10/08/15 11:58 AM  Result Value Ref Range   Glucose-Capillary 106 (H) 65 - 99 mg/dL  Glucose, capillary     Status: Abnormal   Collection Time: 10/08/15  4:23 PM  Result Value Ref Range   Glucose-Capillary 161 (H) 65 - 99 mg/dL    Current Facility-Administered Medications  Medication Dose Route Frequency Provider Last Rate Last Dose  . 0.9 %  sodium chloride infusion  250 mL Intravenous PRN Maryann Mikhail, DO      . acetaminophen (TYLENOL) tablet 650 mg  650 mg Oral Q6H PRN Maryann Mikhail, DO       Or  . acetaminophen (TYLENOL) suppository 650 mg  650 mg Rectal Q6H PRN Maryann Mikhail, DO      . amLODipine (NORVASC) tablet 5 mg  5 mg Oral Daily Maryann Mikhail, DO   5 mg at 10/08/15 1201  . calcium acetate (PHOSLO) capsule 2,001 mg  2,001 mg Oral TID WC Jamal Maes, MD   2,001 mg at 10/08/15 1201  . doxercalciferol (HECTOROL) injection 6 mcg  6 mcg Intravenous Q T,Th,Sa-HD Alric Seton, PA-C   6 mcg at 10/08/15 1114  . heparin injection 5,000 Units  5,000 Units Subcutaneous 3 times per day  Cristal Ford, DO   5,000 Units at 10/08/15 1455  . insulin aspart (novoLOG) injection 0-9 Units  0-9 Units Subcutaneous TID WC Maryann Mikhail, DO   1 Units at 10/07/15 1729  . metoprolol tartrate (LOPRESSOR) tablet 25 mg  25 mg Oral BID Maryann Mikhail, DO   25 mg at 10/08/15 1201  . multivitamin (RENA-VIT) tablet 1 tablet  1 tablet Oral QHS Maryann Mikhail, DO   1 tablet at 10/07/15 2158  . nicotine (NICODERM CQ - dosed in mg/24 hours) patch 21 mg  21 mg Transdermal Daily Maryann Mikhail, DO   21 mg at 10/08/15 1201  . ondansetron (ZOFRAN) tablet 4 mg  4 mg Oral Q6H PRN Maryann Mikhail, DO       Or  . ondansetron (ZOFRAN) injection 4 mg  4 mg Intravenous Q6H PRN Maryann Mikhail, DO      . sodium chloride 0.9 % injection 3 mL  3 mL Intravenous Q12H Maryann Mikhail, DO   3 mL at 10/07/15 2158  . sodium chloride 0.9 % injection 3 mL  3 mL Intravenous PRN Cristal Ford, DO        Musculoskeletal: Strength & Muscle Tone: within normal limits Gait & Station: gait not examined  Patient leans: N/A  Psychiatric Specialty Exam: ROS  Blood pressure 154/68, pulse 68, temperature 97.2 F (36.2 C), temperature source Oral, resp. rate 18, weight 108 lb 11 oz (49.3 kg), SpO2 98 %.Body mass index is 18.09 kg/(m^2).  General Appearance: Well Groomed  Engineer, water::  Good  Speech:  Normal Rate  Volume:  Decreased  Mood:  Euthymic denies depression  Affect:  Appropriate smiles appropriately during session  Thought Process:  Linear  Orientation:  Other:  alert , attentive at this time, oriented to place and to person, when assessed for time orientation she referred to  calendar in room   Thought Content:  denies hallucinations, no delusions, not internally preoccupied   Suicidal Thoughts:  No  Homicidal Thoughts:  No  Memory:  3/3 immeidate recall, 1/3 at 5 minutes   Judgement:  Fair  Insight:  Fair  Psychomotor Activity:  Normal- no agitation or  restlessness   Concentration:  improved    Recall:  [employer] of Knowledge:Good  Language: Good  Akathisia:  Negative  Handed:  Right  AIMS (if indicated):     Assets:  Jane for Improvement Resilience Social Support  ADL's:  Improved   Cognition: improving cognitive status compared to admission  Sleep:        Disposition: No evidence of imminent risk to self or others at present.   Patient does not meet criteria for psychiatric inpatient admission. At this time no indication for standing psychiatric medications - as noted above, no current acute psychiatric symptoms, and patient reports not being on psychiatric medications for many years . If she agrees, patient may benefit from a  referral to outpatient mental health services,  for monitoring and  further support after discharge  COBOS, [facility] 10/08/2015 5:02 PM
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
## 2015-10-09 LAB — CBC
HEMATOCRIT: 31.3 % — AB (ref 36.0–46.0)
Hemoglobin: 9.7 g/dL — ABNORMAL LOW (ref 12.0–15.0)
MCH: 24.9 pg — AB (ref 26.0–34.0)
MCHC: 31 g/dL (ref 30.0–36.0)
MCV: 80.3 fL (ref 78.0–100.0)
Platelets: 146 10*3/uL — ABNORMAL LOW (ref 150–400)
RBC: 3.9 MIL/uL (ref 3.87–5.11)
RDW: 18.3 % — AB (ref 11.5–15.5)
WBC: 12.7 10*3/uL — AB (ref 4.0–10.5)

## 2015-10-09 LAB — BASIC METABOLIC PANEL
ANION GAP: 10 (ref 5–15)
BUN: 28 mg/dL — AB (ref 6–20)
CALCIUM: 6.9 mg/dL — AB (ref 8.9–10.3)
CO2: 23 mmol/L (ref 22–32)
Chloride: 100 mmol/L — ABNORMAL LOW (ref 101–111)
Creatinine, Ser: 4.31 mg/dL — ABNORMAL HIGH (ref 0.44–1.00)
GFR calc Af Amer: 12 mL/min — ABNORMAL LOW (ref >60)
GFR calc non Af Amer: 10 mL/min — ABNORMAL LOW (ref >60)
GLUCOSE: 126 mg/dL — AB (ref 65–99)
POTASSIUM: 3.3 mmol/L — AB (ref 3.5–5.1)
Sodium: 133 mmol/L — ABNORMAL LOW (ref 135–145)

## 2015-10-09 LAB — GLUCOSE, CAPILLARY
GLUCOSE-CAPILLARY: 147 mg/dL — AB (ref 65–99)
Glucose-Capillary: 215 mg/dL — ABNORMAL HIGH (ref 65–99)
Glucose-Capillary: 61 mg/dL — ABNORMAL LOW (ref 65–99)
Glucose-Capillary: 132 mg/dL — ABNORMAL HIGH (ref 65–99)

## 2015-10-09 MED ORDER — CALCIUM ACETATE (PHOS BINDER) 667 MG PO CAPS: 2001.0000 mg | ORAL_CAPSULE | Freq: Three times a day (TID) | ORAL | Status: AC

## 2015-10-09 NOTE — Evaluation (Signed)
Physical Therapy Evaluation Patient Details Name: Jane Higgins MRN: [id number] DOB: 1956-05-06 Today's Date: 10/09/2015   History of Present Illness  Jane Higgins is a 60 y.o. female with a history of end-stage renal disease, diabetes mellitus, hypertension that presented to the emergency department with confusion.Missed a few HD session due to viral illness; noting some medical compliance issues.  Clinical Impression  Patient evaluated by Physical Therapy with no further acute PT needs identified, as she is discharging home today. All education has been completed and the patient has no further questions.  See below for any follow-up Physical Therapy or equipment needs. PT is signing off. Thank you for this referral.     Follow Up Recommendations Home health PT;Supervision - Intermittent [facility] for chronic disease management)  Paged Dr. Carles Collet with the above recommendations    Equipment Recommendations  None recommended by PT    Recommendations for Other Services       Precautions / Restrictions Precautions Precautions: Fall      Mobility  Bed Mobility Overal bed mobility: Modified Independent             General bed mobility comments: slower  Transfers Overall transfer level: Needs assistance Equipment used: Rolling walker (2 wheeled) Transfers: Sit to/from Stand Sit to Stand: Supervision         General transfer comment: Cues for safety  Ambulation/Gait Ambulation/Gait assistance: Supervision Ambulation Distance (Feet): 120 Feet Assistive device: Rolling walker (2 wheeled) Gait Pattern/deviations: Step-through pattern     General Gait Details: Generally unsteady on feet and definitely benefits from UE suppport on RW; cues to self-monitor for activity tolerance  Stairs            Wheelchair Mobility    Modified Rankin (Stroke Patients Only)       Balance                                             Pertinent Vitals/Pain  Pain Assessment: Faces Faces Pain Scale: Hurts a little bit Pain Location: Headache Pain Descriptors / Indicators: Headache Pain Intervention(s): Monitored during session    Home Living Family/patient expects to be discharged to:: Private residence Living Arrangements: Children Available Help at Discharge: Family;Available PRN/intermittently Type of Home: House Home Access: Level entry     Home Layout: One level Home Equipment: Walker - 2 wheels      Prior Function Level of Independence: Independent with assistive device(s)         Comments: RW for amb     Hand Dominance        Extremity/Trunk Assessment   Upper Extremity Assessment: Overall WFL for tasks assessed (for simple mobility)           Lower Extremity Assessment: Generalized weakness      Cervical / Trunk Assessment: Normal  Communication   Communication: No difficulties (Questionable historian)  Cognition Arousal/Alertness: Awake/alert Behavior During Therapy: WFL for tasks assessed/performed Overall Cognitive Status: No family/caregiver present to determine baseline cognitive functioning                      General Comments      Exercises        Assessment/Plan    PT Assessment All further PT needs can be met in the next venue of care  PT Diagnosis Generalized weakness   PT Problem List  Decreased strength;Decreased activity tolerance;Decreased balance;Decreased mobility;Decreased coordination;Decreased safety awareness  PT Treatment Interventions     PT Goals (Current goals can be found in the Care Plan section) Acute Rehab PT Goals Patient Stated Goal: hopes to go home soon PT Goal Formulation: All assessment and education complete, DC therapy    Frequency     Barriers to discharge        Co-evaluation               End of Session Equipment Utilized During Treatment: Gait belt Activity Tolerance: Patient tolerated treatment well Patient left: in chair;with  call bell/phone within reach Nurse Communication: Mobility status         Time: 4847-2072 PT Time Calculation (min) (ACUTE ONLY): 18 min   Charges:   PT Evaluation $Initial PT Evaluation Tier I: 1 Procedure     PT G CodesRoney Marion Hamff 10/09/2015, 4:11 PM  Roney Marion, [place] Pager [phone] Office [phone]

## 2015-10-09 NOTE — Discharge Summary (Signed)
Physician Discharge Summary  Jane Higgins WUJ:[id number] DOB: 1955/11/27 DOA: 10/04/2015  PCP: No primary care provider on file.  Admit date: 10/04/2015 Discharge date: 10/09/2015  Recommendations for Outpatient Follow-up:  1. Pt will need to follow up with PCP in 2 weeks post discharge   Discharge Diagnoses:  Acute encephalopathy -likely due to uremia--missed HD x 2 weeks -improved--10/09/15--pt knew date, person, why she was in hospital, location -CT head: Large remote Right MCA territory infarct, no acute intracranial findings -spoke with Dr Roseanne Reno, neurology, regarding CT findings, CVA old, no further workup needed -likely secondary to missed dialysis/uremia; unlikely infectious- CXR neg -10/04/15--Chest x-ray shows pulmonary vascular congestion, no acute abnormalities -Patient currently afebrile although does have mild leukocytosis -Continue to monitor closely  End-stage renal disease -Patient has missed dialysis for the last 2 weeks due to "viral illness" -Nephrology consulted and appreciated -Continue Renavit -HD 10/08/2015 -10/09/15--discussed with Dr. Latanya Maudlin for d/c  Dyspnea -improved, Likely secondary to missed dialysis -10/04/15 Chest x-ray shows pulmonary vascular congestion -10/09/15--oxygen saturation 98% on room air  Leukocytosis -Likely reactive, will continue to monitor CBC -Blood cultures show no growth to date -Patient remains afebrile and hemodynamically stable  Anemia of chronic disease -Currently Hb 10, continue to monitor CBC  Diabetes mellitus, type II -Continue insulin sliding scale CBG monitoring -Last hemoglobin A1c 6.0 on 05/28/2015 -Patient had an episode of hypoglycemia which improved with eating  -resume 70/30 home dose after d/c  Essential hypertension -Continue metoprolol and amlodipine  Malnutrition -Continue nutritional supplements  Lower extremity edema -Improved, Secondary to missed dialysis  Tobacco abuse -Continue  nicotine patch -no desire to quit  -one-half to one ppd x 30+ years  Hypokalemia -Being replaced, continue to monitor BMP -Magnesium 1.9  Bipolar disorder/ schizophrenia -No longer on meds -Psychiatry consulted and appreciated--did not feel the patient needed to be started back on any chronic medications for the above diagnoses -Asked patient if I could speak with her family, she refused. States "I am the boss, my son tells lies about me."  Goals of care -Not sure if patient will take medications or go to HD upon discharge.  -Patient has poor health literacy and poor insight  Discharge Condition: stable  Disposition: stable  Diet: cardiac--don't need to restrict K per renal Wt Readings from Last 3 Encounters:  10/08/15 49.5 kg (109 lb 2 oz)  06/04/15 56.201 kg (123 lb 14.4 oz)  04/13/13 64.1 kg (141 lb 5 oz)    History of present illness:  60 y.o. female with a history of end-stage renal disease, diabetes mellitus, hypertension that presented to the emergency department with confusion. Patient was brought in by her family members. They state over the last several days, patient has become more more confused. This occurred when patient first dialysis. After questioning patient, his next dialysis for the past 2 weeks due to "viral illness". Patient could not be very specifically what type of illness or symptoms she was having. Currently patient denies any abdominal pain, nausea, vomiting, chest pain, dizziness or headache. She does complain of some shortness of breath. Patient states she did have nausea and vomiting, diarrhea and constipation over the past 2 weeks. In the emergency department, labs showed leukocytosis of 0.6, creatinine of 15.98, BUN of 132, CO2 less than 7, sodium 147, chloride 116. [facility] called for admission.  Nephrology was consulted. The patient underwent dialysis. Her mental status improved. Compliance with dialysis was discussed with the patient. Psychiatry was  consulted and did not feel the  patient needed to restart her previous antipsychotic medications.  Consultants: nephrology  Discharge Exam: Filed Vitals:   10/09/15 0502 10/09/15 0855  BP: 122/61 133/55  Pulse: 76 76  Temp: 98.1 F (36.7 C) 97.2 F (36.2 C)  Resp: 20 20   Filed Vitals:   10/08/15 1805 10/08/15 2009 10/09/15 0502 10/09/15 0855  BP: 142/60 135/59 122/61 133/55  Pulse: 77 74 76 76  Temp: 98 F (36.7 C) 98.1 F (36.7 C) 98.1 F (36.7 C) 97.2 F (36.2 C)  TempSrc: Oral Oral Oral Oral  Resp: 18 19 20 20   Weight:  49.5 kg (109 lb 2 oz)    SpO2: 98% 90% 91% 90%   General: A&O x 3, NAD, pleasant, cooperative Cardiovascular: RRR, no rub, no gallop, no S3 Respiratory: CTAB, no wheeze, no rhonchi Abdomen:soft, nontender, nondistended, positive bowel sounds Extremities: No edema, No lymphangitis, no petechiae  Discharge Instructions      Discharge Instructions    Diet - low sodium heart healthy    Complete by:  As directed      Increase activity slowly    Complete by:  As directed             Medication List    TAKE these medications        amLODipine 5 MG tablet  Commonly known as:  NORVASC  Take 1 tablet (5 mg total) by mouth daily.     ANTIHISTAMINE PO  Take 1 tablet by mouth daily as needed (congestion).     calcium acetate 667 MG capsule  Commonly known as:  PHOSLO  Take 3 capsules (2,001 mg total) by mouth 3 (three) times daily with meals.     feeding supplement (NEPRO CARB STEADY) Liqd  Take 237 mLs by mouth 2 (two) times daily between meals.     insulin NPH-regular Human (70-30) 100 UNIT/ML injection  Commonly known as:  NOVOLIN 70/30  Inject 5 Units into the skin 2 (two) times daily with a meal.     metoprolol tartrate 25 MG tablet  Commonly known as:  LOPRESSOR  Take 1 tablet (25 mg total) by mouth 2 (two) times daily.     multivitamin Tabs tablet  Take 1 tablet by mouth at bedtime.         The results of significant  diagnostics from this hospitalization (including imaging, microbiology, ancillary and laboratory) are listed below for reference.    Significant Diagnostic Studies: Dg Chest 2 View  10/04/2015  CLINICAL DATA:  Altered mental status, hypertension, diabetes mellitus, end-stage renal disease EXAM: CHEST  2 VIEW COMPARISON:  05/28/2015 FINDINGS: RIGHT jugular dual-lumen central venous catheter with tip projecting over RIGHT atrium. Enlargement of cardiac silhouette with pulmonary vascular congestion. Tips of lung apices excluded. No definite acute infiltrate, pleural effusion or pneumothorax. Bones unremarkable. IMPRESSION: Enlargement of cardiac silhouette with pulmonary vascular congestion. No acute abnormalities. Electronically Signed   By: Ulyses SouthwardMark  Boles M.D.   On: 10/04/2015 16:02   Ct Head Wo Contrast  10/04/2015  CLINICAL DATA:  Frontal headache and blurred vision. Symptom duration unspecified. EXAM: CT HEAD WITHOUT CONTRAST TECHNIQUE: Contiguous axial images were obtained from the base of the skull through the vertex without intravenous contrast. COMPARISON:  CT head 04/12/2013.  MR head 04/14/2013. FINDINGS: There is a large remote RIGHT MCA territory infarct affecting the RIGHT parietal lobe. There is well-defined encephalomalacia and compensatory enlargement of the atrium of the RIGHT lateral ventricle. No acute infarct, hemorrhage, mass lesion, hydrocephalus, or extra-axial  fluid. No apparent white matter disease. Calvarium intact. No visible sinus or mastoid fluid. Advanced vascular calcification in the carotid siphons. BILATERAL TMJ arthritis. Compared with prior studies, this large infarct was not present. IMPRESSION: Large remote RIGHT MCA territory infarct. No acute intracranial findings. Electronically Signed   By: Elsie Stain M.D.   On: 10/04/2015 19:47     Microbiology: Recent Results (from the past 240 hour(s))  Culture, blood (routine x 2)     Status: None (Preliminary result)    Collection Time: 10/07/15  2:00 PM  Result Value Ref Range Status   Specimen Description BLOOD LEFT ANTECUBITAL  Final   Special Requests BOTTLES DRAWN AEROBIC AND ANAEROBIC 10CC  Final   Culture NO GROWTH < 24 HOURS  Final   Report Status PENDING  Incomplete  Culture, blood (routine x 2)     Status: None (Preliminary result)   Collection Time: 10/07/15  2:10 PM  Result Value Ref Range Status   Specimen Description BLOOD BLOOD LEFT HAND  Final   Special Requests BOTTLES DRAWN AEROBIC AND ANAEROBIC 10CC  Final   Culture NO GROWTH < 24 HOURS  Final   Report Status PENDING  Incomplete     Labs: Basic Metabolic Panel:  Recent Labs Lab 10/04/15 2208 10/05/15 0453 10/06/15 0548 10/06/15 0732 10/07/15 0800 10/08/15 0559 10/08/15 0712 10/09/15 0325  NA 147* 144 140  --  140 137 135 133*  K 3.9 2.7* 2.8*  --  3.7 3.3* 3.3* 3.3*  CL 117* 110 107  --  105 104 102 100*  CO2 7* 14* 20*  --  20* 23 21* 23  GLUCOSE 125* 126* 97  --  140* 110* 109* 126*  BUN 138* 85* 45*  --  30* 50* 50* 28*  CREATININE 16.48* 11.33* 7.45*  --  4.91* 6.02* 6.05* 4.31*  CALCIUM 7.0* 6.9* 6.8*  --  6.9* 6.6* 6.6* 6.9*  MG  --   --  1.9  --   --   --   --   --   PHOS 15.2* 9.2*  --  5.1* 3.7  --  4.5  --    Liver Function Tests:  Recent Labs Lab 10/04/15 2208 10/05/15 0453 10/07/15 0800 10/08/15 0712  ALBUMIN 3.3* 3.1* 3.2* 3.0*   No results for input(s): LIPASE, AMYLASE in the last 168 hours. No results for input(s): AMMONIA in the last 168 hours. CBC:  Recent Labs Lab 10/06/15 0548 10/07/15 0800 10/08/15 0559 10/08/15 0712 10/09/15 0325  WBC 11.2* 14.6* 11.7* 11.7* 12.7*  HGB 11.8* 11.9* 10.4* 10.0* 9.7*  HCT 36.4 38.2 32.8* 33.0* 31.3*  MCV 77.0* 79.6 79.2 79.7 80.3  PLT 248 196 165 175 146*   Cardiac Enzymes: No results for input(s): CKTOTAL, CKMB, CKMBINDEX, TROPONINI in the last 168 hours. BNP: Invalid input(s): POCBNP CBG:  Recent Labs Lab 10/08/15 1623 10/08/15 2008  10/09/15 0745 10/09/15 1201 10/09/15 1316  GLUCAP 161* 120* 215* 61* 132*    Time coordinating discharge:  Greater than 30 minutes  Signed:  Channel Papandrea, DO [facility] Pager: [phone] 10/09/2015, 2:00 PM

## 2015-10-09 NOTE — Progress Notes (Signed)
Hypoglycemic Event  CBG: 61   Treatment: 15 GM carbohydrate snack  Symptoms: Nervous/irritable  Follow-up CBG: Time:1316 CBG Result:132  Possible Reasons for Event: Inadequate meal intake  Comments/MD notified:   no new orders.    Devera Englander A

## 2015-10-09 NOTE — Progress Notes (Signed)
[facility] Progress Note  Assessment: 1. ESRD - missed HD for 2 weeks. Doesn't know HD schedule, Tu-Wed-THurs she insists. Psych consult  said no psych iilness, no meds needed. She apparently has a guardian. Not sure who this is. Does not require hospitalization at this point, ok for dc today. Resume TTS dialysis OP setting.  2. AMS/confusion - back at baseline most likely 3. HTN/Volume: s/p 3 HD, last was Thurs with post wt 50.6 kg.  BP stable; below EDW -titrating down- will have new lower edw for d/c; on MTP 4. Secondary hyperparathyroidism - Ca low.6.6, corrected 7.4   - Last iPTH was 1094 in December -resuming hectorol 6 - this should improve Ca levels- she did not have a med list at her HD center but progress notes suggest she was on 2 phoslo ac though I doubt she was taking; Now on 3 phoslo ac. 5. Anemia - HGB 10down some- variable. 86% sat - no Fe - no ESA yet - if it continues to trend down - start ESA with Tuesday HD 6. DM2 - SSI per primary  7. HD access - using AVF here, cont as OP and dc TDC when ready  Plan - dc home, will d/w primary MD  Vinson Moselleob Kaiesha Tonner MD [facility] pager [phone]    cell [phone] 10/09/2015, 1:30 PM    Subjective:  Wants HD cath removed, no other complaints  Objective Filed Vitals:   10/08/15 1805 10/08/15 2009 10/09/15 0502 10/09/15 0855  BP: 142/60 135/59 122/61 133/55  Pulse: 77 74 76 76  Temp: 98 F (36.7 C) 98.1 F (36.7 C) 98.1 F (36.7 C) 97.2 F (36.2 C)  TempSrc: Oral Oral Oral Oral  Resp: 18 19 20 20   Weight:  49.5 kg (109 lb 2 oz)    SpO2: 98% 90% 91% 90%   Physical Exam pre HD wt was 51.1 - goal 2.5 General: NAD  Heart: RRR Lungs: no rales Abdomen: soft NT Extremities: no edema  Dialysis Access: right upper AVF Qb 250 first use - no problems.  Dialysis Orders: TueThuSat, 4 hrs 0 min, 180NRe Optiflux, BFR 400, DFR Manual 800 mL/min, EDW 53 (kg), Dialysate 3.0 K, 2.25 Ca, UFR Profile: None,  Sodium Model: None, Access: Catheter-Tunneled and right upper AVF Heparin: NO HEPARIN Hectoral: 6 mcg IV Q TTS Mircera 200 mcg IV q 2 weeks (09/13/15) Additional Objective Labs: Basic Metabolic Panel:  Recent Labs Lab 10/06/15 0732 10/07/15 0800 10/08/15 0559 10/08/15 0712 10/09/15 0325  NA  --  140 137 135 133*  K  --  3.7 3.3* 3.3* 3.3*  CL  --  105 104 102 100*  CO2  --  20* 23 21* 23  GLUCOSE  --  140* 110* 109* 126*  BUN  --  30* 50* 50* 28*  CREATININE  --  4.91* 6.02* 6.05* 4.31*  CALCIUM  --  6.9* 6.6* 6.6* 6.9*  PHOS 5.1* 3.7  --  4.5  --    Liver Function Tests:  Recent Labs Lab 10/05/15 0453 10/07/15 0800 10/08/15 0712  ALBUMIN 3.1* 3.2* 3.0*   CBC:  Recent Labs Lab 10/06/15 0548 10/07/15 0800 10/08/15 0559 10/08/15 0712 10/09/15 0325  WBC 11.2* 14.6* 11.7* 11.7* 12.7*  HGB 11.8* 11.9* 10.4* 10.0* 9.7*  HCT 36.4 38.2 32.8* 33.0* 31.3*  MCV 77.0* 79.6 79.2 79.7 80.3  PLT 248 196 165 175 146*   Blood Culture    Component Value Date/Time   SDES BLOOD BLOOD LEFT  HAND 10/07/2015 1410   SPECREQUEST BOTTLES DRAWN AEROBIC AND ANAEROBIC 10CC 10/07/2015 1410   CULT NO GROWTH < 24 HOURS 10/07/2015 1410   REPTSTATUS PENDING 10/07/2015 1410   CBG:  Recent Labs Lab 10/08/15 1623 10/08/15 2008 10/09/15 0745 10/09/15 1201 10/09/15 1316  GLUCAP 161* 120* 215* 61* 132*  Medications:   . amLODipine  5 mg Oral Daily  . calcium acetate  2,001 mg Oral TID WC  . doxercalciferol  6 mcg Intravenous Q T,Th,Sa-HD  . heparin  5,000 Units Subcutaneous 3 times per day  . insulin aspart  0-9 Units Subcutaneous TID WC  . metoprolol tartrate  25 mg Oral BID  . multivitamin  1 tablet Oral QHS  . nicotine  21 mg Transdermal Daily  . sodium chloride  3 mL Intravenous Q12H

## 2015-10-12 LAB — CULTURE, BLOOD (ROUTINE X 2)
Culture: NO GROWTH
Culture: NO GROWTH

## 2015-10-25 ENCOUNTER — Encounter (HOSPITAL_COMMUNITY): Payer: Self-pay

## 2015-10-25 DIAGNOSIS — N186 End stage renal disease: Secondary | ICD-10-CM | POA: Insufficient documentation

## 2015-10-25 DIAGNOSIS — I120 Hypertensive chronic kidney disease with stage 5 chronic kidney disease or end stage renal disease: Secondary | ICD-10-CM | POA: Diagnosis not present

## 2015-10-25 DIAGNOSIS — E119 Type 2 diabetes mellitus without complications: Secondary | ICD-10-CM | POA: Diagnosis not present

## 2015-10-25 DIAGNOSIS — F17210 Nicotine dependence, cigarettes, uncomplicated: Secondary | ICD-10-CM | POA: Insufficient documentation

## 2015-10-25 DIAGNOSIS — Z794 Long term (current) use of insulin: Secondary | ICD-10-CM | POA: Insufficient documentation

## 2015-10-25 DIAGNOSIS — Z452 Encounter for adjustment and management of vascular access device: Secondary | ICD-10-CM | POA: Diagnosis present

## 2015-10-25 DIAGNOSIS — R0602 Shortness of breath: Secondary | ICD-10-CM | POA: Insufficient documentation

## 2015-10-25 DIAGNOSIS — Z992 Dependence on renal dialysis: Secondary | ICD-10-CM | POA: Diagnosis not present

## 2015-10-25 DIAGNOSIS — F319 Bipolar disorder, unspecified: Secondary | ICD-10-CM | POA: Diagnosis not present

## 2015-10-25 NOTE — ED Notes (Signed)
Pt reports she was at dialysis today and they told her to come to the ER to have chest dialysis catheter surgically removed and she points to area on her right upper chest. She receives dialysis on T/Th/Sat and she received full dialysis today. She states they use her right arm fistula for dialysis. Pt is a poor historian.

## 2015-10-26 ENCOUNTER — Emergency Department (HOSPITAL_COMMUNITY)
Admission: EM | Admit: 2015-10-26 | Discharge: 2015-10-26 | Disposition: A | Discharge status: Home / Self Care | Payer: Medicare Other | Attending: Emergency Medicine | Admitting: Emergency Medicine

## 2015-10-26 DIAGNOSIS — Z992 Dependence on renal dialysis: Secondary | ICD-10-CM

## 2015-10-26 DIAGNOSIS — N186 End stage renal disease: Secondary | ICD-10-CM

## 2015-10-26 NOTE — ED Provider Notes (Signed)
CSN: [id number]     Arrival date & time 10/25/15  2233 History  By signing my name below, I, Jane Higgins, attest that this documentation has been prepared under the direction and in the presence of Gilda Crease, MD. Electronically Signed: Soijett Higgins, ED Scribe. 10/26/2015. 2:20 AM.   Chief Complaint  Patient presents with  . Vascular Access Problem      The history is provided by the patient. No language interpreter was used.    Jane Higgins is a 60 y.o. female with a medical hx of ESRD, HTN, and DM who presents to the Emergency Department complaining of vascular access problem onset PTA. She notes that she was at dialysis today and was informed that she needed to be seen in the ED for surgical removal of her chest dialysis catheter. Pt states that her chest dialysis catheter was placed in the hospital during a surgery to place her right arm fistula completed by Dr. Tawanna Cooler Early on 06/03/2015. She states that she has been completing dialysis since 05/2015 and that she receives dialysis on Tuesday, Thursday, and Saturday and that she received a full dialysis yesterday. She reports that at her dialysis center they are currently using her right arm fistula. Pt is having associated symptoms of SOB. She notes that she has not tried any medications for the relief of her symptoms. She denies pain to the catheter site and any other symptoms.    Past Medical History  Diagnosis Date  . Bipolar 1 disorder (HCC)   . Hypertension   . Diabetes mellitus without complication (HCC)   . ESRD (end stage renal disease) [facility])    Past Surgical History  Procedure Laterality Date  . Tubal ligation    . Av fistula placement Right 06/03/2015    Procedure: ARTERIOVENOUS (AV) FISTULA CREATION ;  Surgeon: Larina Earthly, MD;  Location: [facility] OR;  Service: Vascular;  Laterality: Right;   Family History  Problem Relation Age of Onset  . Colon cancer      Neg history  . Kidney disease Cousin    Social  History  Substance Use Topics  . Smoking status: Current Every Day Smoker -- 1.00 packs/day    Types: Cigarettes  . Smokeless tobacco: None  . Alcohol Use: No     Comment: no   OB History    No data available     Review of Systems  Constitutional: Negative for fever.  Respiratory: Positive for shortness of breath.   Skin:       Chest catheter in place without pain to the area. Right arm fistula in place  All other systems reviewed and are negative.     Allergies  Iodine  Home Medications   Prior to Admission medications   Medication Sig Start Date End Date Taking? Authorizing Provider  amLODipine (NORVASC) 5 MG tablet Take 1 tablet (5 mg total) by mouth daily. Patient not taking: Reported on 10/04/2015 04/16/13   Stephani Police, PA-C  calcium acetate (PHOSLO) 667 MG capsule Take 3 capsules (2,001 mg total) by mouth 3 (three) times daily with meals. 10/09/15   Catarina Hartshorn, MD  insulin NPH-regular Human (NOVOLIN 70/30) (70-30) 100 UNIT/ML injection Inject 5 Units into the skin 2 (two) times daily with a meal. Patient not taking: Reported on 10/04/2015 06/05/15   Richarda Overlie, MD  metoprolol tartrate (LOPRESSOR) 25 MG tablet Take 1 tablet (25 mg total) by mouth 2 (two) times daily. Patient not taking: Reported on 10/04/2015  04/16/13   Stephani Police, PA-C  multivitamin (RENA-VIT) TABS tablet Take 1 tablet by mouth at bedtime. Patient not taking: Reported on 10/04/2015 06/05/15   Richarda Overlie, MD  Nutritional Supplements (FEEDING SUPPLEMENT, NEPRO CARB STEADY,) LIQD Take 237 mLs by mouth 2 (two) times daily between meals. Patient not taking: Reported on 10/04/2015 06/05/15   Richarda Overlie, MD  Triprolidine-Pseudoephedrine (ANTIHISTAMINE PO) Take 1 tablet by mouth daily as needed (congestion).    Historical Provider, MD   BP 180/93 mmHg  Pulse 102  Temp(Src) 98.2 F (36.8 C) (Oral)  Resp 14  Ht 5' 5.75" (1.67 m)  Wt 119 lb (53.978 kg)  BMI 19.35 kg/m2  SpO2 96% Physical Exam   Constitutional: She is oriented to person, place, and time. She appears well-developed and well-nourished. No distress.  HENT:  Head: Normocephalic and atraumatic.  Right Ear: Hearing normal.  Left Ear: Hearing normal.  Nose: Nose normal.  Mouth/Throat: Oropharynx is clear and moist and mucous membranes are normal.  Eyes: Conjunctivae and EOM are normal. Pupils are equal, round, and reactive to light.  Neck: Normal range of motion. Neck supple.  Cardiovascular: Regular rhythm, S1 normal and S2 normal.  Exam reveals no gallop and no friction rub.   No murmur heard. Pulmonary/Chest: Effort normal and breath sounds normal. No respiratory distress. She exhibits no tenderness.  Abdominal: Soft. Normal appearance and bowel sounds are normal. There is no hepatosplenomegaly. There is no tenderness. There is no rebound, no guarding, no tenderness at McBurney's point and negative Murphy's sign. No hernia.  Musculoskeletal: Normal range of motion.  Neurological: She is alert and oriented to person, place, and time. She has normal strength. No cranial nerve deficit or sensory deficit. Coordination normal. GCS eye subscore is 4. GCS verbal subscore is 5. GCS motor subscore is 6.  Skin: Skin is warm, dry and intact. No rash noted. No cyanosis.  Psychiatric: She has a normal mood and affect. Her speech is normal and behavior is normal. Thought content normal.  Nursing note and vitals reviewed.   ED Course  Procedures (including critical care time) DIAGNOSTIC STUDIES: Oxygen Saturation is 96% on RA, nl by my interpretation.    COORDINATION OF CARE: 2:19 AM Discussed treatment plan with pt at bedside and pt agreed to plan.    Labs Review Labs Reviewed - No data to display  Imaging Review No results found.    EKG Interpretation None      MDM   Final diagnoses:  None   tunneled catheter for dialysis in place  Patient presents to the emergency department for evaluation of tunneled  dialysis catheter. Patient went to dialysis today and seems to think that she was told she needed to go to the ER to have the catheter removed. I do not think that she needs it emergently removed. Site appears normal, there is no redness, swelling, tenderness or drainage. There is nothing to suggest infection. Patient is now utilizing her fistula in the right upper arm, and it is appropriate to have the catheter removed, but does not need it removed tonight. Will refer back to interventional radiology, as IR originally placed the line.  I personally performed the services described in this documentation, which was scribed in my presence. The recorded information has been reviewed and is accurate.     Gilda Crease, MD 10/26/15 0300

## 2015-10-26 NOTE — Discharge Instructions (Signed)
You need to contact Interventional Radiology at the number listed below to schedule an appointment to have the catheter removed.  Chronic Kidney Disease Chronic kidney disease happens when the kidneys are damaged over a long period. The kidneys are two organs that do many important jobs in the body. These jobs include:  Removing wastes and extra fluids from the blood.  Making hormones that help to keep the body healthy.  Making sure that the body has the right amount of fluids and chemicals. Chronic kidney disease may be caused by many things. The kidney damage occurs slowly. If too much damage occurs, the kidneys may stop working the way that they should. This is dangerous. Treatment can help to slow down the damage and keep it from getting worse. HOME CARE  Follow your diet as told by your doctor. You may need to limit the amount of salt (sodium) and protein that you eat each day.  Take medicines only as told by your doctor. Do not take any new medicines unless your doctor approves it.  Quit smoking if you smoke. Talk to your doctor about programs that may help you quit smoking.  Have your blood pressure checked regularly and keep track of the results.  Start or keep doing an exercise plan.  Get shots (immunizations) as told by your doctor.  Take vitamins and minerals as told by your doctor.  Keep all follow-up visits as told by your doctor. This is important. GET HELP RIGHT AWAY IF:   Your symptoms get worse.  You have new symptoms.  You have symptoms of end-stage kidney disease. These include:  Headaches.  Skin that is darker or lighter than normal.  Numbness in the hands or feet.  Easy bruising.  Frequent hiccups.  Stopping of menstrual periods in women.  You have a fever.  You are making very little pee (urine).  You have pain or bleeding when you pee.   This information is not intended to replace advice given to you by your health care provider. Make sure  you discuss any questions you have with your health care provider.   Document Released: 12/19/2009 Document Revised: 06/15/2015 Document Reviewed: 05/23/2012 Elsevier Interactive Patient Education [employer]

## 2015-11-13 ENCOUNTER — Emergency Department (HOSPITAL_COMMUNITY): Payer: Medicare Other

## 2015-11-13 ENCOUNTER — Inpatient Hospital Stay (HOSPITAL_COMMUNITY): Payer: Medicare Other

## 2015-11-13 ENCOUNTER — Inpatient Hospital Stay (HOSPITAL_COMMUNITY)
Admission: EM | Admit: 2015-11-13 | Discharge: 2015-11-21 | DRG: 070 | Disposition: A | Discharge status: Skilled Nursing Facility | Payer: Medicare Other | Attending: Internal Medicine | Admitting: Internal Medicine

## 2015-11-13 ENCOUNTER — Encounter (HOSPITAL_COMMUNITY): Payer: Self-pay | Admitting: Emergency Medicine

## 2015-11-13 DIAGNOSIS — Z992 Dependence on renal dialysis: Secondary | ICD-10-CM

## 2015-11-13 DIAGNOSIS — G9349 Other encephalopathy: Secondary | ICD-10-CM | POA: Diagnosis present

## 2015-11-13 DIAGNOSIS — N186 End stage renal disease: Secondary | ICD-10-CM | POA: Diagnosis present

## 2015-11-13 DIAGNOSIS — N2581 Secondary hyperparathyroidism of renal origin: Secondary | ICD-10-CM | POA: Diagnosis present

## 2015-11-13 DIAGNOSIS — E872 Acidosis: Secondary | ICD-10-CM | POA: Diagnosis not present

## 2015-11-13 DIAGNOSIS — Z66 Do not resuscitate: Secondary | ICD-10-CM

## 2015-11-13 DIAGNOSIS — F17210 Nicotine dependence, cigarettes, uncomplicated: Secondary | ICD-10-CM | POA: Diagnosis present

## 2015-11-13 DIAGNOSIS — R6511 Systemic inflammatory response syndrome (SIRS) of non-infectious origin with acute organ dysfunction: Secondary | ICD-10-CM | POA: Diagnosis present

## 2015-11-13 DIAGNOSIS — E1165 Type 2 diabetes mellitus with hyperglycemia: Secondary | ICD-10-CM | POA: Diagnosis present

## 2015-11-13 DIAGNOSIS — R0689 Other abnormalities of breathing: Secondary | ICD-10-CM | POA: Diagnosis not present

## 2015-11-13 DIAGNOSIS — D638 Anemia in other chronic diseases classified elsewhere: Secondary | ICD-10-CM | POA: Diagnosis present

## 2015-11-13 DIAGNOSIS — N179 Acute kidney failure, unspecified: Secondary | ICD-10-CM | POA: Diagnosis present

## 2015-11-13 DIAGNOSIS — D649 Anemia, unspecified: Secondary | ICD-10-CM | POA: Diagnosis present

## 2015-11-13 DIAGNOSIS — T17908A Unspecified foreign body in respiratory tract, part unspecified causing other injury, initial encounter: Secondary | ICD-10-CM

## 2015-11-13 DIAGNOSIS — R739 Hyperglycemia, unspecified: Secondary | ICD-10-CM | POA: Diagnosis present

## 2015-11-13 DIAGNOSIS — I120 Hypertensive chronic kidney disease with stage 5 chronic kidney disease or end stage renal disease: Secondary | ICD-10-CM | POA: Diagnosis present

## 2015-11-13 DIAGNOSIS — Z515 Encounter for palliative care: Secondary | ICD-10-CM | POA: Insufficient documentation

## 2015-11-13 DIAGNOSIS — G9341 Metabolic encephalopathy: Secondary | ICD-10-CM | POA: Diagnosis not present

## 2015-11-13 DIAGNOSIS — E876 Hypokalemia: Secondary | ICD-10-CM | POA: Diagnosis not present

## 2015-11-13 DIAGNOSIS — Z794 Long term (current) use of insulin: Secondary | ICD-10-CM

## 2015-11-13 DIAGNOSIS — R680 Hypothermia, not associated with low environmental temperature: Secondary | ICD-10-CM | POA: Diagnosis present

## 2015-11-13 DIAGNOSIS — F319 Bipolar disorder, unspecified: Secondary | ICD-10-CM | POA: Diagnosis present

## 2015-11-13 DIAGNOSIS — E119 Type 2 diabetes mellitus without complications: Secondary | ICD-10-CM

## 2015-11-13 DIAGNOSIS — Z7189 Other specified counseling: Secondary | ICD-10-CM | POA: Diagnosis not present

## 2015-11-13 DIAGNOSIS — G9340 Encephalopathy, unspecified: Secondary | ICD-10-CM | POA: Diagnosis not present

## 2015-11-13 DIAGNOSIS — F209 Schizophrenia, unspecified: Secondary | ICD-10-CM | POA: Diagnosis present

## 2015-11-13 DIAGNOSIS — R569 Unspecified convulsions: Secondary | ICD-10-CM | POA: Diagnosis not present

## 2015-11-13 DIAGNOSIS — R404 Transient alteration of awareness: Secondary | ICD-10-CM | POA: Diagnosis not present

## 2015-11-13 DIAGNOSIS — E1122 Type 2 diabetes mellitus with diabetic chronic kidney disease: Secondary | ICD-10-CM | POA: Diagnosis not present

## 2015-11-13 DIAGNOSIS — E46 Unspecified protein-calorie malnutrition: Secondary | ICD-10-CM | POA: Diagnosis present

## 2015-11-13 DIAGNOSIS — R64 Cachexia: Secondary | ICD-10-CM | POA: Diagnosis present

## 2015-11-13 DIAGNOSIS — E11649 Type 2 diabetes mellitus with hypoglycemia without coma: Secondary | ICD-10-CM | POA: Diagnosis present

## 2015-11-13 DIAGNOSIS — R748 Abnormal levels of other serum enzymes: Secondary | ICD-10-CM | POA: Diagnosis present

## 2015-11-13 DIAGNOSIS — R6510 Systemic inflammatory response syndrome (SIRS) of non-infectious origin without acute organ dysfunction: Secondary | ICD-10-CM | POA: Diagnosis present

## 2015-11-13 DIAGNOSIS — Z681 Body mass index (BMI) 19 or less, adult: Secondary | ICD-10-CM

## 2015-11-13 DIAGNOSIS — Z9115 Patient's noncompliance with renal dialysis: Secondary | ICD-10-CM

## 2015-11-13 DIAGNOSIS — Z01818 Encounter for other preprocedural examination: Secondary | ICD-10-CM

## 2015-11-13 DIAGNOSIS — Z978 Presence of other specified devices: Secondary | ICD-10-CM

## 2015-11-13 DIAGNOSIS — R531 Weakness: Secondary | ICD-10-CM | POA: Insufficient documentation

## 2015-11-13 DIAGNOSIS — Z4659 Encounter for fitting and adjustment of other gastrointestinal appliance and device: Secondary | ICD-10-CM

## 2015-11-13 DIAGNOSIS — N19 Unspecified kidney failure: Secondary | ICD-10-CM | POA: Diagnosis present

## 2015-11-13 LAB — GLUCOSE, CAPILLARY: GLUCOSE-CAPILLARY: 104 mg/dL — AB (ref 65–99)

## 2015-11-13 LAB — CBC WITH DIFFERENTIAL/PLATELET
BASOS ABS: 0 10*3/uL (ref 0.0–0.1)
BASOS PCT: 0 %
EOS ABS: 0 10*3/uL (ref 0.0–0.7)
Eosinophils Relative: 0 %
HEMATOCRIT: 29.4 % — AB (ref 36.0–46.0)
HEMOGLOBIN: 9 g/dL — AB (ref 12.0–15.0)
LYMPHS PCT: 5 %
Lymphs Abs: 0.7 10*3/uL (ref 0.7–4.0)
MCH: 23.7 pg — ABNORMAL LOW (ref 26.0–34.0)
MCHC: 30.6 g/dL (ref 30.0–36.0)
MCV: 77.6 fL — ABNORMAL LOW (ref 78.0–100.0)
MONO ABS: 0.6 10*3/uL (ref 0.1–1.0)
Monocytes Relative: 4 %
NEUTROS PCT: 91 %
Neutro Abs: 12.7 10*3/uL — ABNORMAL HIGH (ref 1.7–7.7)
Platelets: 309 10*3/uL (ref 150–400)
RBC: 3.79 MIL/uL — ABNORMAL LOW (ref 3.87–5.11)
RDW: 21.9 % — AB (ref 11.5–15.5)
WBC: 14 10*3/uL — AB (ref 4.0–10.5)

## 2015-11-13 LAB — COMPREHENSIVE METABOLIC PANEL
ALBUMIN: 3.5 g/dL (ref 3.5–5.0)
ALK PHOS: 86 U/L (ref 38–126)
ALT: 9 U/L — ABNORMAL LOW (ref 14–54)
AST: 10 U/L — AB (ref 15–41)
BUN: 173 mg/dL — AB (ref 6–20)
Calcium: 6.5 mg/dL — ABNORMAL LOW (ref 8.9–10.3)
Chloride: 112 mmol/L — ABNORMAL HIGH (ref 101–111)
Creatinine, Ser: 18.32 mg/dL — ABNORMAL HIGH (ref 0.44–1.00)
GFR calc Af Amer: 2 mL/min — ABNORMAL LOW (ref >60)
GFR calc non Af Amer: 2 mL/min — ABNORMAL LOW (ref >60)
GLUCOSE: 172 mg/dL — AB (ref 65–99)
POTASSIUM: 5 mmol/L (ref 3.5–5.1)
SODIUM: 144 mmol/L (ref 135–145)
Total Bilirubin: 1 mg/dL (ref 0.3–1.2)
Total Protein: 6.7 g/dL (ref 6.5–8.1)

## 2015-11-13 LAB — CREATININE, SERUM
CREATININE: 18.63 mg/dL — AB (ref 0.44–1.00)
GFR calc Af Amer: 2 mL/min — ABNORMAL LOW (ref >60)
GFR calc non Af Amer: 2 mL/min — ABNORMAL LOW (ref >60)

## 2015-11-13 LAB — CBC
HEMATOCRIT: 28.7 % — AB (ref 36.0–46.0)
HEMOGLOBIN: 8.6 g/dL — AB (ref 12.0–15.0)
MCH: 23.5 pg — AB (ref 26.0–34.0)
MCHC: 30 g/dL (ref 30.0–36.0)
MCV: 78.4 fL (ref 78.0–100.0)
Platelets: 291 10*3/uL (ref 150–400)
RBC: 3.66 MIL/uL — AB (ref 3.87–5.11)
RDW: 22.2 % — ABNORMAL HIGH (ref 11.5–15.5)
WBC: 13.3 10*3/uL — ABNORMAL HIGH (ref 4.0–10.5)

## 2015-11-13 LAB — TROPONIN I
Troponin I: 0.37 ng/mL — ABNORMAL HIGH (ref <0.031)
Troponin I: 0.91 ng/mL (ref <0.031)

## 2015-11-13 LAB — LACTIC ACID, PLASMA
Lactic Acid, Venous: 1.3 mmol/L (ref 0.5–2.0)
Lactic Acid, Venous: 1.1 mmol/L (ref 0.5–2.0)

## 2015-11-13 LAB — AMMONIA: AMMONIA: 95 umol/L — AB (ref 9–35)

## 2015-11-13 LAB — MRSA PCR SCREENING: MRSA BY PCR: NEGATIVE

## 2015-11-13 MED ORDER — PENTAFLUOROPROP-TETRAFLUOROETH EX AERO: 1.0000 "application " | INHALATION_SPRAY | CUTANEOUS | Status: DC | PRN

## 2015-11-13 MED ORDER — LIDOCAINE HCL (PF) 1 % IJ SOLN: 5.0000 mL | INTRAMUSCULAR | Status: DC | PRN

## 2015-11-13 MED ORDER — ONDANSETRON HCL 4 MG PO TABS: 4.0000 mg | ORAL_TABLET | Freq: Four times a day (QID) | ORAL | Status: DC | PRN

## 2015-11-13 MED ORDER — INSULIN ASPART 100 UNIT/ML ~~LOC~~ SOLN: 0.0000 [IU] | Freq: Three times a day (TID) | SUBCUTANEOUS | Status: DC

## 2015-11-13 MED ORDER — SODIUM CHLORIDE 0.9% FLUSH
3.0000 mL | Freq: Two times a day (BID) | INTRAVENOUS | Status: DC
  Administered 2015-11-13 – 2015-11-21 (×14): 3 mL via INTRAVENOUS

## 2015-11-13 MED ORDER — SODIUM CHLORIDE 0.9% FLUSH: 3.0000 mL | INTRAVENOUS | Status: DC | PRN

## 2015-11-13 MED ORDER — HYDRALAZINE HCL 20 MG/ML IJ SOLN
20.0000 mg | Freq: Four times a day (QID) | INTRAMUSCULAR | Status: DC | PRN
  Administered 2015-11-16 – 2015-11-21 (×3): 20 mg via INTRAVENOUS
  Filled 2015-11-13 (×3): qty 1

## 2015-11-13 MED ORDER — SENNOSIDES-DOCUSATE SODIUM 8.6-50 MG PO TABS
1.0000 | ORAL_TABLET | Freq: Every evening | ORAL | Status: DC | PRN
  Filled 2015-11-13: qty 1

## 2015-11-13 MED ORDER — LIDOCAINE-PRILOCAINE 2.5-2.5 % EX CREA
1.0000 "application " | TOPICAL_CREAM | CUTANEOUS | Status: DC | PRN
  Filled 2015-11-13: qty 5

## 2015-11-13 MED ORDER — HEPARIN SODIUM (PORCINE) 5000 UNIT/ML IJ SOLN
5000.0000 [IU] | Freq: Three times a day (TID) | INTRAMUSCULAR | Status: DC
  Administered 2015-11-13 – 2015-11-21 (×22): 5000 [IU] via SUBCUTANEOUS
  Filled 2015-11-13 (×27): qty 1

## 2015-11-13 MED ORDER — HEPARIN SODIUM (PORCINE) 1000 UNIT/ML DIALYSIS
2000.0000 [IU] | INTRAMUSCULAR | Status: DC | PRN
  Filled 2015-11-13: qty 2

## 2015-11-13 MED ORDER — HYDRALAZINE HCL 20 MG/ML IJ SOLN
10.0000 mg | Freq: Four times a day (QID) | INTRAMUSCULAR | Status: DC | PRN
  Administered 2015-11-13: 10 mg via INTRAVENOUS
  Filled 2015-11-13: qty 1

## 2015-11-13 MED ORDER — PIPERACILLIN-TAZOBACTAM 3.375 G IVPB 30 MIN
3.3750 g | Freq: Once | INTRAVENOUS | Status: AC
  Administered 2015-11-13: 3.375 g via INTRAVENOUS
  Filled 2015-11-13: qty 50

## 2015-11-13 MED ORDER — PIPERACILLIN-TAZOBACTAM IN DEX 2-0.25 GM/50ML IV SOLN
2.2500 g | Freq: Three times a day (TID) | INTRAVENOUS | Status: DC
  Administered 2015-11-13 – 2015-11-16 (×8): 2.25 g via INTRAVENOUS
  Filled 2015-11-13 (×11): qty 50

## 2015-11-13 MED ORDER — VANCOMYCIN HCL IN DEXTROSE 1-5 GM/200ML-% IV SOLN
1000.0000 mg | Freq: Once | INTRAVENOUS | Status: AC
  Administered 2015-11-13: 1000 mg via INTRAVENOUS
  Filled 2015-11-13: qty 200

## 2015-11-13 MED ORDER — SODIUM CHLORIDE 0.9 % IV SOLN: 100.0000 mL | INTRAVENOUS | Status: DC | PRN

## 2015-11-13 MED ORDER — HYDRALAZINE HCL 20 MG/ML IJ SOLN: 10.0000 mg | Freq: Once | INTRAMUSCULAR | Status: DC

## 2015-11-13 MED ORDER — ACETAMINOPHEN 325 MG PO TABS
650.0000 mg | ORAL_TABLET | Freq: Four times a day (QID) | ORAL | Status: DC | PRN
  Administered 2015-11-18: 650 mg via ORAL
  Filled 2015-11-13: qty 2

## 2015-11-13 MED ORDER — HEPARIN SODIUM (PORCINE) 1000 UNIT/ML DIALYSIS
1000.0000 [IU] | INTRAMUSCULAR | Status: DC | PRN
  Filled 2015-11-13: qty 1

## 2015-11-13 MED ORDER — INSULIN ASPART 100 UNIT/ML ~~LOC~~ SOLN: 0.0000 [IU] | Freq: Every day | SUBCUTANEOUS | Status: DC

## 2015-11-13 MED ORDER — SODIUM CHLORIDE 0.9 % IV SOLN
250.0000 mL | INTRAVENOUS | Status: DC | PRN
  Administered 2015-11-14 (×2): 250 mL via INTRAVENOUS

## 2015-11-13 MED ORDER — ACETAMINOPHEN 650 MG RE SUPP
650.0000 mg | Freq: Four times a day (QID) | RECTAL | Status: DC | PRN
Start: 2015-11-13 — End: 2015-11-21

## 2015-11-13 MED ORDER — ALTEPLASE 2 MG IJ SOLR
2.0000 mg | Freq: Once | INTRAMUSCULAR | Status: DC | PRN
  Filled 2015-11-13: qty 2

## 2015-11-13 MED ORDER — MORPHINE SULFATE (PF) 2 MG/ML IV SOLN: 1.0000 mg | INTRAVENOUS | Status: DC | PRN

## 2015-11-13 MED ORDER — ONDANSETRON HCL 4 MG/2ML IJ SOLN: 4.0000 mg | Freq: Four times a day (QID) | INTRAMUSCULAR | Status: DC | PRN

## 2015-11-13 NOTE — Progress Notes (Signed)
CRITICAL VALUE ALERT  Critical value received:  ABG results  PH 7.13 CO2 35.2 PO2 99.5 Bicarb 11.4  Date of notification:  11/13/2015  Time of notification:  2330  Critical value read back:Yes.    Nurse who received alert:  Gates Rigg  MD notified (1st page):  Lenny Pastel  Time of first page:  2333  MD notified (2nd page):  Time of second page:  Responding MD:  Lenny Pastel  Time MD responded: [phone]

## 2015-11-13 NOTE — Consult Note (Signed)
[facility] Consult Note     Date: 11/13/2015                  Patient Name:  Jane Higgins  MRN: [id number]  DOB: [birth year]  Age / Sex: 60 y.o., female         PCP: No primary care provider on file.                 Service Requesting Consult: Emergency Department                  Reason for Consult: Encephalopathy in setting of ESRD            Chief Complaint: confusion HPI:   The patient is a 60 y.o. year-old female with DM, ESRD, HTN, bipolar disorder/schizophrenia, secondary hyperparathyroidism. She is supposed to dialyze on TTS at [facility], however her son and legal guardian (due to psychiatric history), Lynnae Sandhoff,  notes that she has refused over the last 2 weeks. He notes that the last HD session may have been around 11/01/15 when she told him she did not want to undergo dialysis anymore. He notes that she intermittently talks to her self and changes her voice to sound deep and like a man, even prior to discontinuing HD. Her mental status has declined over the last 2 weeks and he notes that over the last 3-4 days she  became progressively more somnolent and confused. Her son found her on the floor sleeping this morning which prompted him to bring her into the ED. When he went to pick her up he noted she was weak all over and her speech was slurred. Son is not sure if she still makes urine. She started vomiting last week, however has been eating normally.   In the ED she was noted to to have a SCr of 18.32, K of 5.0, bicarb of <7 and BUN of 173.   Past Medical History  Diagnosis Date  . Bipolar 1 disorder ([place])   . Hypertension   . Diabetes mellitus without complication ([place])   . ESRD (end stage renal disease) [facility])     Past Surgical History  Procedure Laterality Date  . Tubal ligation    . Av fistula placement Right 06/03/2015    Procedure: ARTERIOVENOUS (AV) FISTULA CREATION ;  Surgeon: Rosetta Posner, MD;  Location: [facility] OR;  Service:  Vascular;  Laterality: Right;    Family History  Problem Relation Age of Onset  . Colon cancer      Neg history  . Kidney disease Cousin    Social History:  reports that she has been smoking Cigarettes.  She has been smoking about 1.00 pack per day. She does not have any smokeless tobacco history on file. She reports that she does not drink alcohol or use illicit drugs.  Allergies:  Allergies  Allergen Reactions  . Iodine Other (See Comments)    unknown     (Not in a hospital admission)  Results for orders placed or performed during the hospital encounter of 11/13/15 (from the past 48 hour(s))  CBC with Differential     Status: Abnormal   Collection Time: 11/13/15 12:40 PM  Result Value Ref Range   WBC 14.0 (H) 4.0 - 10.5 K/uL    Comment: WHITE COUNT CONFIRMED ON SMEAR   RBC 3.79 (L) 3.87 - 5.11 MIL/uL   Hemoglobin 9.0 (L) 12.0 - 15.0 g/dL   HCT 29.4 (L) 36.0 - 46.0 %  MCV 77.6 (L) 78.0 - 100.0 fL   MCH 23.7 (L) 26.0 - 34.0 pg   MCHC 30.6 30.0 - 36.0 g/dL   RDW 21.9 (H) 11.5 - 15.5 %   Platelets 309 150 - 400 K/uL    Comment: PLATELET COUNT CONFIRMED BY SMEAR   Neutrophils Relative % 91 %   Lymphocytes Relative 5 %   Monocytes Relative 4 %   Eosinophils Relative 0 %   Basophils Relative 0 %   Neutro Abs 12.7 (H) 1.7 - 7.7 K/uL   Lymphs Abs 0.7 0.7 - 4.0 K/uL   Monocytes Absolute 0.6 0.1 - 1.0 K/uL   Eosinophils Absolute 0.0 0.0 - 0.7 K/uL   Basophils Absolute 0.0 0.0 - 0.1 K/uL  Comprehensive metabolic panel     Status: Abnormal   Collection Time: 11/13/15 12:40 PM  Result Value Ref Range   Sodium 144 135 - 145 mmol/L   Potassium 5.0 3.5 - 5.1 mmol/L   Chloride 112 (H) 101 - 111 mmol/L   CO2 <7 (L) 22 - 32 mmol/L    Comment: CRITICAL RESULT CALLED TO, READ BACK BY AND VERIFIED WITH: HOLLY HALL RN AT 1351 11/13/15 BY WOOLLENK    Glucose, Bld 172 (H) 65 - 99 mg/dL   BUN 173 (H) 6 - 20 mg/dL   Creatinine, Ser 18.32 (H) 0.44 - 1.00 mg/dL   Calcium 6.5 (L) 8.9 -  10.3 mg/dL   Total Protein 6.7 6.5 - 8.1 g/dL   Albumin 3.5 3.5 - 5.0 g/dL   AST 10 (L) 15 - 41 U/L   ALT 9 (L) 14 - 54 U/L   Alkaline Phosphatase 86 38 - 126 U/L   Total Bilirubin 1.0 0.3 - 1.2 mg/dL   GFR calc non Af Amer 2 (L) >60 mL/min   GFR calc Af Amer 2 (L) >60 mL/min    Comment: (NOTE) The eGFR has been calculated using the CKD EPI equation. This calculation has not been validated in all clinical situations. eGFR's persistently <60 mL/min signify possible Chronic Kidney Disease.    Anion gap NOT CALCULATED 5 - 15  Ammonia     Status: Abnormal   Collection Time: 11/13/15 12:40 PM  Result Value Ref Range   Ammonia 95 (H) 9 - 35 umol/L  Troponin I     Status: Abnormal   Collection Time: 11/13/15 12:40 PM  Result Value Ref Range   Troponin I 0.37 (H) <0.031 ng/mL    Comment:        PERSISTENTLY INCREASED TROPONIN VALUES IN THE RANGE OF 0.04-0.49 ng/mL CAN BE SEEN IN:       -UNSTABLE ANGINA       -CONGESTIVE HEART FAILURE       -MYOCARDITIS       -CHEST TRAUMA       -ARRYHTHMIAS       -LATE PRESENTING MYOCARDIAL INFARCTION       -COPD   CLINICAL FOLLOW-UP RECOMMENDED.   Lactic acid, plasma     Status: None   Collection Time: 11/13/15  1:10 PM  Result Value Ref Range   Lactic Acid, Venous 1.3 0.5 - 2.0 mmol/L   Dg Chest 2 View  11/13/2015  CLINICAL DATA:  Left-sided weakness.  Dialysis patient. EXAM: CHEST  2 VIEW COMPARISON:  10/04/2015 FINDINGS: Right jugular central venous catheter tip in the right atrium is unchanged. Negative for edema. Lungs are clear without infiltrate or effusion. Cardiac enlargement unchanged. IMPRESSION: No active cardiopulmonary disease. Electronically Signed  By: Franchot Gallo M.D.   On: 11/13/2015 13:50   Ct Head Wo Contrast  11/13/2015  CLINICAL DATA:  SIGNIFICANT LEFT SIDED WEAKNESS X 2 DAYS SLURRED SPEECH PMH: DM, HTN, Bipolar 1 disorder ([place]); ESRD EXAM: CT HEAD WITHOUT CONTRAST TECHNIQUE: Contiguous axial images were obtained from  the base of the skull through the vertex without intravenous contrast. COMPARISON:  10/04/2015 FINDINGS: Remote right parietal infarct again noted no associated hemorrhage. No evidence for acute extension of the infarct. There is no intra or extra-axial fluid collection or mass lesion. The basilar cisterns and ventricles have a normal appearance. There is no CT evidence for acute infarction or hemorrhage. Bone windows show significant atherosclerotic calcification of the internal carotid arteries. No acute calvarial injury. Mild mucoperiosteal thickening of the ethmoid air cells. IMPRESSION: 1. 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 2.  No evidence for acute intracranial abnormality. 3. Mild sinusitis. Electronically Signed   By: Nolon Nations M.D.   On: 11/13/2015 14:24    Review of Systems  Constitutional: Positive for malaise/fatigue. Negative for fever and chills.  Eyes: Negative for blurred vision and double vision.  Respiratory: Negative for cough, hemoptysis and shortness of breath.   Cardiovascular: Positive for leg swelling. Negative for chest pain and orthopnea.  Gastrointestinal: Positive for nausea and vomiting.  Skin: Negative for rash.  Neurological: Positive for weakness. Negative for focal weakness and headaches.  Psychiatric/Behavioral: Positive for hallucinations.    (obtained from son, as patient not conversant).   Blood pressure 193/80, pulse 83, temperature 96.8 F (36 C), temperature source Temporal, resp. rate 16, SpO2 96 %. Physical Exam  General: Lying in bed with eyes open but not tracking. Does not respond.  Eyes: Conjunctivae non-injected.   ENTM: Poor dentition. Tacky mucous membranes. No nasal discharge.  Neck: Supple, no LAD . +JVD.  Cardiovascular: RRR. No murmurs, rubs, or gallops noted. 1+ pitting edema noted in the LEs bilaterally.  Respiratory: No increased WOB. CTAB without wheezing, rhonchi, or crackles noted, do note transmitted upper  airway noises.  Abdomen: +BS, soft, non-distended, non-tender.  MSK: Right UE fistula in place with good bruit and thrill.  Skin: No rashes noted  Neuro: Intermittently moves right upper extremity and head. Does not follow command. PEERL. Neutral Babinski.     Assessment/Plan 1. ESRD: On Tues/Thur/Sat dialysis from previous notes, however has been without it for over 2 weeks. Has a working R brachiocephalic AV fistula in place (placed 06/03/15). Given extreme encephalopathy, will go for emergent HD today. Will need palliative consult to discuss goals of care given the patient has refused HD several times and required hospitalization. At last hospitalization, psych saw patient and felt she had capacity, this may need to be re-visited.  2. Encephalopathy: Likely secondary to uremia. No focal deficits on exam, CT head without acute changes. Emergent HD.   3. HTN: not at goal. Will proceed with HD and then re-start home meds.   4. CKD metabolic bone disease: secondary hyperparathyroidism (last iPTH was 1094 in Dec per old notes). Current calcium is 6.5.  Continue 3 phoslo TIDAC.   5. Anion gap metabolic acidosis: from ESRD without HD. Proceed with HD.  6. Anemia: Hgb stable. No to ESA or iron supplement. Last las 12/16 revealed sat 86%, ferritin in 8/16 was 44.   7. Bipolar: She used to be on Risperdal, on nothing for several years.   8. Type 2 diabetes mellitus- well controled in 8/16 with A1c 6.0. Per primary team  Kathrine Cords, MD [place] Resident, PGY-2 11/13/2015, 2:32 PM

## 2015-11-13 NOTE — ED Notes (Signed)
CRITICAL VALUE ALERT  Critical value received: CO2 <7  Date of notification:  11/12/2015 Time of notification:  1352  Critical value read back: YES Nurse who received alert:  Fredirick Maudlin RN   MD notified: MD Erin Hearing

## 2015-11-13 NOTE — Progress Notes (Signed)
CRITICAL VALUE ALERT  Critical value received:  Troponin 0.91   Date of notification:  11/13/2015  Time of notification:  2245  Critical value read back:Yes.    Nurse who received alert:M. Cornelia Copa  MD notified (1st page):  Lenny Pastel Time of first page:  2245  MD notified (2nd page):  Time of second page:  Responding MD:    Time MD responded:

## 2015-11-13 NOTE — Progress Notes (Signed)
Pharmacy Antibiotic Note Jane Higgins is a 60 y.o. female admitted on 11/13/2015 with acute encephalopathy and uremia in setting of ESRD with last IHD session ~ 2 weeks ago. Pharmacy has been consulted for Zosyn and vancomycin dosing for empiric coverage.  Plan: 1. Zosyn 2.25 grams IV q 8 hours 2. Vancomycin 1 gram LD x 1 now; will plan further dosing based on dialysis 3. If continued will obtain pre-HD level at [facility]; goal 15 - 25 4. Await pending micro data and reassess abx course in next 48 hours  5. Following along with you daily   Temp (24hrs), Avg:95.4 F (35.2 C), Min:93.9 F (34.4 C), Max:96.8 F (36 C)   Recent Labs Lab 11/13/15 1240 11/13/15 1310  WBC 14.0*  --   CREATININE 18.32*  --   LATICACIDVEN  --  1.3  ESRD: Normal T,TH,SAT  Antimicrobials this admission: 2/5 Zosyn >> 2/5  Vancomycin >>   Dose adjustments this admission: n/a  Microbiology results: 2/5 BCx: px 2/5 UCx: px   Thank you for allowing pharmacy to be a part of this patient's care.  Pollyann Samples, PharmD, BCPS 11/13/2015, 3:21 PM Pager: [phone]

## 2015-11-13 NOTE — Significant Event (Signed)
Rapid Response Event Note  Overview: Time Called: 2248 Arrival Time: 2250 Event Type: Neurologic  Initial Focused Assessment: Obtunded and foaming at the mouth   Interventions: ABG, EKG, and CT of head  Event Summary: Called to assist with care of patient that is unresponsive with snoring respirations. Patient chart history was reviewed. Reported returning from HD tonight around 20:20. Unresponsive to voice and  moderate stimuli but responds to pain stimuli. Heart sounds regular. On-call provider was made aware of patients condition and presented at bedside. ABG and EKG was performed. Patient was also taken for a repeat CT of head. Airway remains patent but concerning for obstruction due to being obtunded. Gag reflex is present. Vitals noted. Patient is being transferred to 2M09 for further therapy.        Jane Higgins

## 2015-11-13 NOTE — Progress Notes (Signed)
[facility] hospitalist progress note. Chief complaint. Bradycardia, hypoxia, etc.  this 60 year old femalewith end-stage renal disease was brought to the hospital by family after she refused to have dialysis for approximately 2 weeks. She became progressively more altered in her mentation. She was noted to have an elevated white count as well as a elevated troponin 0.37. Felt to have uremic encephalopathy and did undergo dialysis earlier today. Patient continued to be very lethargic post dialysis. She had an event this evening where she became rate a cardiac in the 40s per central telemetry began to have a frothy discharge from her mouth and desats into the 50s. Rapid response notified and I came to the bedside as well. I found the patient essentially obtunded with no withdrawal from pain response. An arterial blood gas was obtained and resulted pH 7.147, PCO2 34.1, PO2 95.7, bicarbonate 11.4. On admission the patient had a elevated troponin of 0.37. A second troponin has resulted 0.91. ysical exam. Vital signs. Temperature 97.4, pulse 89, respiration 16, blood pressure 180/66. O2 sats 96%. General appearance. Frail elderly female who is essentially obtunded and nonresponsive to pain stimuli. Cardiac. Rate and rhythm regular. Lungs. Breath sounds clear. Neurologic. No withdrawal from painful stimuli. Pupils appear equal. Impression/plan. Problem #1. Altered mental status with hypoxia and bradycardia. Etiology unclear. Discussed with neurology who felt that this likely did not represent seizure activity. CT of the brain ordered we'll follow for results. Problem #2. Metabolic acidosis. Discussed of events above with critical-care who has kindly consented to see patient in consult. We'll follow for their recommendations. Problem #3. Elevated troponin. No evidence of acute ischemia per recent EKG. We'll follow for critical-care's recommendations and if this is not addressed will contact cardiology for  recommendations.  Problem #1.

## 2015-11-13 NOTE — H&P (Signed)
[facility] History and Physical  Jane Higgins ZOX:[id number] DOB: 1955/12/21 DOA: 11/13/2015  Referring physician: Erin Hearing PCP: No primary care provider on file.   Chief Complaint: confusion  HPI: Jane Higgins is a 60 y.o. female with a past medical history of end-stage renal disease, diabetes, hypertension, previous stroke, bipolar disorder/schizophrenia, secondary hyperparathyroidism as the emergency department from home with the chief complaint of gradual persistent worsening confusion. Initial evaluation reveals acute on chronic renal failure, acute encephalopathy, leukocytosis elevated troponin  Information is obtained from the son who is healthcare pelvic hernia and with whom the patient resides. He reports she has refused dialysis for the last 2 weeks. He reports that on her last hemodialysis session she told him she did not 12 continue with dialysis. He also reports that she has a tendency to talk to herself and to people who were not there. He said ever the last several days her intake has decreased responsiveness has decreased this morning he awakened and she "could not move". He noted he found her on the floor sleeping this morning and when he tried to pick her up she was weak all over. No Reports of any vomiting diarrhea fever. No report of chest pain cough shortness of breath or unilateral weakness. Does report some mild swelling bilateral extremities. Reports she would attempt to respond verbally  In the emergency department she is hypothermic, hemodynamically stable. She is provided with antibiotics and bear hugger   Review of Systems:  Unable to complete due to encephalopathy.  Past Medical History  Diagnosis Date  . Bipolar 1 disorder (HCC)   . Hypertension   . Diabetes mellitus without complication (HCC)   . ESRD (end stage renal disease) [facility])    Past Surgical History  Procedure Laterality Date  . Tubal ligation    . Av fistula placement Right 06/03/2015      Procedure: ARTERIOVENOUS (AV) FISTULA CREATION ;  Surgeon: Larina Earthly, MD;  Location: [facility] OR;  Service: Vascular;  Laterality: Right;   Social History:  reports that she has been smoking Cigarettes.  She has been smoking about 1.00 pack per day. She does not have any smokeless tobacco history on file. She reports that she does not drink alcohol or use illicit drugs. Patient lives at home with son his primary caregiver.  Allergies  Allergen Reactions  . Iodine Other (See Comments)    unknown    Family History  Problem Relation Age of Onset  . Colon cancer      Neg history  . Kidney disease Cousin      Prior to Admission medications   Medication Sig Start Date End Date Taking? Authorizing Provider  amLODipine (NORVASC) 5 MG tablet Take 1 tablet (5 mg total) by mouth daily. Patient not taking: Reported on 10/04/2015 04/16/13   Stephani Police, PA-C  calcium acetate (PHOSLO) 667 MG capsule Take 3 capsules (2,001 mg total) by mouth 3 (three) times daily with meals. 10/09/15   Catarina Hartshorn, MD  insulin NPH-regular Human (NOVOLIN 70/30) (70-30) 100 UNIT/ML injection Inject 5 Units into the skin 2 (two) times daily with a meal. Patient not taking: Reported on 10/04/2015 06/05/15   Richarda Overlie, MD  metoprolol tartrate (LOPRESSOR) 25 MG tablet Take 1 tablet (25 mg total) by mouth 2 (two) times daily. Patient not taking: Reported on 10/04/2015 04/16/13   Stephani Police, PA-C  multivitamin (RENA-VIT) TABS tablet Take 1 tablet by mouth at bedtime. Patient not taking: Reported on 10/04/2015  06/05/15   Richarda Overlie, MD  Nutritional Supplements (FEEDING SUPPLEMENT, NEPRO CARB STEADY,) LIQD Take 237 mLs by mouth 2 (two) times daily between meals. Patient not taking: Reported on 10/04/2015 06/05/15   Richarda Overlie, MD  Triprolidine-Pseudoephedrine (ANTIHISTAMINE PO) Take 1 tablet by mouth daily as needed (congestion).    Historical Provider, MD   Physical Exam: Filed Vitals:   11/13/15 1303 11/13/15 1415  11/13/15 1430 11/13/15 1432  BP: 193/80 179/95 193/81   Pulse: 83 79 82   Temp: 96.8 F (36 C)   93.9 F (34.4 C)  TempSrc: Temporal   Rectal  Resp: SpO2: 96% 96% 92%     Wt Readings from Last 3 Encounters:  10/25/15 53.978 kg (119 lb)  10/08/15 49.5 kg (109 lb 2 oz)  06/04/15 56.201 kg (123 lb 14.4 oz)    General:  Appears calm and comfortable chronically ill  Eyes: PERRL, normal lids, irises & conjunctiva  ENT: grossly normal hearing, his membranes of her mouth are slightly pale somewhat dry  Neck: no LAD, masses or thyromegaly Cardiovascular: RRR, no m/r/g. No LE edema.  Respiratory: CTA bilaterally, no w/r/r. Normal respiratory effort. Abdomen: soft, ntnd positive bowel sounds  Skin: no rash or induration seen on limited exam Musculoskeletal: grossly normal tone BUE/BLE Psychiatric: grossly normal mood and affect, speech fluent and appropriate Neurologic: Eyes open does not track attempts to follow commands no verbal response           Labs on Admission:  Basic Metabolic Panel:  Recent Labs Lab 11/13/15 1240  NA 144  K 5.0  CL 112*  CO2 <7*  GLUCOSE 172*  BUN 173*  CREATININE 18.32*  CALCIUM 6.5*   Liver Function Tests:  Recent Labs Lab 11/13/15 1240  AST 10*  ALT 9*  ALKPHOS 86  BILITOT 1.0  PROT 6.7  ALBUMIN 3.5   No results for input(s): LIPASE, AMYLASE in the last 168 hours.  Recent Labs Lab 11/13/15 1240  AMMONIA 95*   CBC:  Recent Labs Lab 11/13/15 1240  WBC 14.0*  NEUTROABS 12.7*  HGB 9.0*  HCT 29.4*  MCV 77.6*  PLT 309   Cardiac Enzymes:  Recent Labs Lab 11/13/15 1240  TROPONINI 0.37*    BNP (last 3 results) No results for input(s): BNP in the last 8760 hours.  ProBNP (last 3 results) No results for input(s): PROBNP in the last 8760 hours.  CBG: No results for input(s): GLUCAP in the last 168 hours.  Radiological Exams on Admission: Dg Chest 2 View  11/13/2015  CLINICAL DATA:  Left-sided weakness.   Dialysis patient. EXAM: CHEST  2 VIEW COMPARISON:  10/04/2015 FINDINGS: Right jugular central venous catheter tip in the right atrium is unchanged. Negative for edema. Lungs are clear without infiltrate or effusion. Cardiac enlargement unchanged. IMPRESSION: No active cardiopulmonary disease. Electronically Signed   By: Marlan Palau M.D.   On: 11/13/2015 13:50   Ct Head Wo Contrast  11/13/2015  CLINICAL DATA:  SIGNIFICANT LEFT SIDED WEAKNESS X 2 DAYS SLURRED SPEECH PMH: DM, HTN, Bipolar 1 disorder (HCC); ESRD EXAM: CT HEAD WITHOUT CONTRAST TECHNIQUE: Contiguous axial images were obtained from the base of the skull through the vertex without intravenous contrast. COMPARISON:  10/04/2015 FINDINGS: Remote right parietal infarct again noted no associated hemorrhage. No evidence for acute extension of the infarct. There is no intra or extra-axial fluid collection or mass lesion. The basilar cisterns and ventricles have a normal appearance. There is no  CT evidence for acute infarction or hemorrhage. Bone windows show significant atherosclerotic calcification of the internal carotid arteries. No acute calvarial injury. Mild mucoperiosteal thickening of the ethmoid air cells. IMPRESSION: 1. 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 2.  No evidence for acute intracranial abnormality. 3. Mild sinusitis. Electronically Signed   By: Norva Pavlov M.D.   On: 11/13/2015 14:24    EKG: Independently reviewed. Sinus rhythm Paired ventricular premature complexes Probable left atrial enlargement LVH with secondary repolarization abnormality Anterior Q waves, possibly due to LVH Prolonged QT interval Baseline wander in lead(s)  Assessment/Plan Principal Problem:   [place]ic encephalopathy Active Problems:   Hyperglycemia   Schizophrenia (HCC)   DM2 (diabetes mellitus, type 2) (HCC)   Anemia   ESRD needing dialysis (HCC)   SIRS (systemic inflammatory response syndrome) (HCC)  #1. [place]ic encephalopathy.  Creatinine 18.5 baseline if occult to determine appears to be between 5 and 7. CT of the head stable remote right parietal infarct no acute abnormalities. Hypothermia, lactic acid 1.3, ammonia 95 initial troponin 0.37. EDMD discussed with intensivist who opined patient protecting airway did not need ICU -Admit to step down -Nephrology consult -Dialysis per nephrology -Palliative care  #2. End-stage renal disease. She is a Tuesday Thursday Saturday dialysis patient. She has missed dialysis for 2 weeks per her son as she decided she didn't want to go anymore. -Nephrology consult -Dialysis per nephrology -Palliative care consult for goals of care and end-of-life decisions  #3. SIRS/ Leukocytosis. Source unclear He is hypothermic. Chest x-ray without infiltrate. Son unsure if she makes urine. Lactic acid within the limits of normal -Blood cultures -Vancomycin per pharmacy -Zosyn per pharmacy   #4.SIRS. Hypothermia, lactic acid within limits of normal, leukocytosis chest x-ray without infiltrate. Hemodynamically stable not hypoxic evaluated by intensivist as noted above -Bear hugger -hold fluids -Antibiotics as noted above -Blood cultures -Urinalysis if able  #5. Diabetes. Home regimen includes Novolin 70/30. Serum glucose 173 on admission -Hemoglobin A1c -Hold home regimen for now -Sliding scale  #6. Schizophrenia/bipolar. Patient has been prescribed medications but does not take. See #1. She had a psych consult last hospitalization. Chart review indicates at that time no evidence for imminent risk to self or others and not candidate for inpatient. -May need to consider reevaluation -How to care   nephrology  Code Status: full DVT Prophylaxis: Family Communication: son Disposition Plan: home  Time spent: 60 minutes  [facility] M [facility]

## 2015-11-13 NOTE — ED Provider Notes (Signed)
CSN: [id number]     Arrival date & time 11/13/15  1245 History   First MD Initiated Contact with Patient 11/13/15 1251     Chief Complaint  Patient presents with  . Stroke Symptoms     (Consider location/radiation/quality/duration/timing/severity/associated sxs/prior Treatment) Patient is a 60 y.o. female presenting with weakness.  Weakness This is a recurrent problem. The current episode started more than 2 days ago. The problem occurs constantly. The problem has been gradually worsening. Nothing aggravates the symptoms. Nothing relieves the symptoms. She has tried nothing for the symptoms.    Past Medical History  Diagnosis Date  . Bipolar 1 disorder (HCC)   . Hypertension   . Diabetes mellitus without complication (HCC)   . ESRD (end stage renal disease) [facility])    Past Surgical History  Procedure Laterality Date  . Tubal ligation    . Av fistula placement Right 06/03/2015    Procedure: ARTERIOVENOUS (AV) FISTULA CREATION ;  Surgeon: Larina Earthly, MD;  Location: [facility] OR;  Service: Vascular;  Laterality: Right;   Family History  Problem Relation Age of Onset  . Colon cancer      Neg history  . Kidney disease Cousin    Social History  Substance Use Topics  . Smoking status: Current Every Day Smoker -- 1.00 packs/day    Types: Cigarettes  . Smokeless tobacco: None  . Alcohol Use: No     Comment: no   OB History    No data available     Review of Systems  Unable to perform ROS: Mental status change  Neurological: Positive for weakness.      Allergies  Iodine  Home Medications   Prior to Admission medications   Medication Sig Start Date End Date Taking? Authorizing Provider  amLODipine (NORVASC) 5 MG tablet Take 1 tablet (5 mg total) by mouth daily. Patient not taking: Reported on 10/04/2015 04/16/13   Stephani Police, PA-C  calcium acetate (PHOSLO) 667 MG capsule Take 3 capsules (2,001 mg total) by mouth 3 (three) times daily with meals. 10/09/15   Catarina Hartshorn, MD   insulin NPH-regular Human (NOVOLIN 70/30) (70-30) 100 UNIT/ML injection Inject 5 Units into the skin 2 (two) times daily with a meal. Patient not taking: Reported on 10/04/2015 06/05/15   Richarda Overlie, MD  metoprolol tartrate (LOPRESSOR) 25 MG tablet Take 1 tablet (25 mg total) by mouth 2 (two) times daily. Patient not taking: Reported on 10/04/2015 04/16/13   Stephani Police, PA-C  multivitamin (RENA-VIT) TABS tablet Take 1 tablet by mouth at bedtime. Patient not taking: Reported on 10/04/2015 06/05/15   Richarda Overlie, MD  Nutritional Supplements (FEEDING SUPPLEMENT, NEPRO CARB STEADY,) LIQD Take 237 mLs by mouth 2 (two) times daily between meals. Patient not taking: Reported on 10/04/2015 06/05/15   Richarda Overlie, MD  Triprolidine-Pseudoephedrine (ANTIHISTAMINE PO) Take 1 tablet by mouth daily as needed (congestion).    Historical Provider, MD   BP 175/89 mmHg  Pulse 86  Temp(Src) 97.2 F (36.2 C) (Axillary)  Resp 19  Wt 119 lb 0.8 oz (54 kg)  SpO2 98% Physical Exam  Constitutional: She appears well-developed and well-nourished.  HENT:  Head: Normocephalic and atraumatic.  Eyes: Conjunctivae and EOM are normal. Pupils are equal, round, and reactive to light.  Neck: Normal range of motion.  Cardiovascular: Normal rate and regular rhythm.   Pulmonary/Chest: No stridor. No respiratory distress. She has no wheezes.  Abdominal: Soft. She exhibits no distension. There is no tenderness.  Musculoskeletal: She exhibits no edema or tenderness.  Neurological: She is alert.  Skin: Skin is dry.  Nursing note and vitals reviewed.   ED Course  Procedures (including critical care time)  CRITICAL CARE Performed by: Marily Memos   Total critical care time: 30 minutes Critical care time was exclusive of separately billable procedures and treating other patients. Critical care was necessary to treat or prevent imminent or life-threatening deterioration. Critical care was time spent personally by  me on the following activities: development of treatment plan with patient and/or surrogate as well as nursing, discussions with consultants, evaluation of patient's response to treatment, examination of patient, obtaining history from patient or surrogate, ordering and performing treatments and interventions, ordering and review of laboratory studies, ordering and review of radiographic studies, pulse oximetry and re-evaluation of patient's condition.   Labs Review Labs Reviewed  CBC WITH DIFFERENTIAL/PLATELET - Abnormal; Notable for the following:    WBC 14.0 (*)    RBC 3.79 (*)    Hemoglobin 9.0 (*)    HCT 29.4 (*)    MCV 77.6 (*)    MCH 23.7 (*)    RDW 21.9 (*)    Neutro Abs 12.7 (*)    All other components within normal limits  COMPREHENSIVE METABOLIC PANEL - Abnormal; Notable for the following:    Chloride 112 (*)    CO2 <7 (*)    Glucose, Bld 172 (*)    BUN 173 (*)    Creatinine, Ser 18.32 (*)    Calcium 6.5 (*)    AST 10 (*)    ALT 9 (*)    GFR calc non Af Amer 2 (*)    GFR calc Af Amer 2 (*)    All other components within normal limits  AMMONIA - Abnormal; Notable for the following:    Ammonia 95 (*)    All other components within normal limits  TROPONIN I - Abnormal; Notable for the following:    Troponin I 0.37 (*)    All other components within normal limits  CBC - Abnormal; Notable for the following:    WBC 13.3 (*)    RBC 3.66 (*)    Hemoglobin 8.6 (*)    HCT 28.7 (*)    MCH 23.5 (*)    RDW 22.2 (*)    All other components within normal limits  CREATININE, SERUM - Abnormal; Notable for the following:    Creatinine, Ser 18.63 (*)    GFR calc non Af Amer 2 (*)    GFR calc Af Amer 2 (*)    All other components within normal limits  CULTURE, BLOOD (ROUTINE X 2)  CULTURE, BLOOD (ROUTINE X 2)  LACTIC ACID, PLASMA  LACTIC ACID, PLASMA  URINALYSIS, ROUTINE W REFLEX MICROSCOPIC (NOT AT [facility])  HEMOGLOBIN A1C  TROPONIN I  HEPATITIS B SURFACE ANTIGEN  BASIC  METABOLIC PANEL  CBC  TROPONIN I  TSH    Imaging Review Dg Chest 2 View  11/13/2015  CLINICAL DATA:  Left-sided weakness.  Dialysis patient. EXAM: CHEST  2 VIEW COMPARISON:  10/04/2015 FINDINGS: Right jugular central venous catheter tip in the right atrium is unchanged. Negative for edema. Lungs are clear without infiltrate or effusion. Cardiac enlargement unchanged. IMPRESSION: No active cardiopulmonary disease. Electronically Signed   By: Marlan Palau M.D.   On: 11/13/2015 13:50   Ct Head Wo Contrast  11/13/2015  CLINICAL DATA:  SIGNIFICANT LEFT SIDED WEAKNESS X 2 DAYS SLURRED SPEECH PMH: DM, HTN, Bipolar 1 disorder (  HCC); ESRD EXAM: CT HEAD WITHOUT CONTRAST TECHNIQUE: Contiguous axial images were obtained from the base of the skull through the vertex without intravenous contrast. COMPARISON:  10/04/2015 FINDINGS: Remote right parietal infarct again noted no associated hemorrhage. No evidence for acute extension of the infarct. There is no intra or extra-axial fluid collection or mass lesion. The basilar cisterns and ventricles have a normal appearance. There is no CT evidence for acute infarction or hemorrhage. Bone windows show significant atherosclerotic calcification of the internal carotid arteries. No acute calvarial injury. Mild mucoperiosteal thickening of the ethmoid air cells. IMPRESSION: 1. 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 2.  No evidence for acute intracranial abnormality. 3. Mild sinusitis. Electronically Signed   By: Norva Pavlov M.D.   On: 11/13/2015 14:24   I have personally reviewed and evaluated these images and lab results as part of my medical decision-making.   EKG Interpretation   Date/Time:  Sunday November 13 2015 12:56:43 EST Ventricular Rate:  87 PR Interval:  208 QRS Duration: 115 QT Interval:  493 QTC Calculation: 593 R Axis:   21 Text Interpretation:  Sinus rhythm Paired ventricular premature complexes  Probable left atrial enlargement  LVH with secondary repolarization  abnormality Anterior Q waves, possibly due to LVH Prolonged QT interval  Baseline wander in lead(s) V3 V6 Confirmed by [facility] MD, Barbara Cower [phone]) on  11/13/2015 3:25:57 PM      MDM   Final diagnoses:  Uremic encephalopathy  SIRS (systemic inflammatory response syndrome) (HCC)    60 yo F here with uremic encephalopathy from not having had dialysis for 2 weeks. Reportedly didn't want it anymoe, however may have been psychotic when she made that decision as she has been off her meds and has been having delusions and hallucinations since that time. Was also hypothermic, so bair hugger and abx given in case it was also related to sepsis. D/W nephrology to set up dialysis, and medicine for admission.     Marily Memos, MD 11/13/15 [phone]

## 2015-11-13 NOTE — ED Notes (Signed)
Pt to ER via [facility] after family member called out for left sided weakness. Pt LSN was 2 days ago. Pt is also dialysis patient and has not been in 2 weeks. Pt has noted swelling to bilateral extremities. CBG 209, BP 168/88 - pt will follow commands but is confused. Speech is slurred. Pt is generally weak with significant weakness to left side.

## 2015-11-13 NOTE — Progress Notes (Signed)
At about 2245 RN received notification that pts HR dropped to 48 and O2 stats was 56. Rn went into the pts room and pts breathing had changed with O2 stats now in the 70s and was not responding to her name. RN applied 2L nasal cannula and called rapid response due to the change in pts. Breathing and Neuro status. Lenny Pastel notified and requested at the bedside. ABG and CT ordered. Will continue to monitor.

## 2015-11-13 NOTE — ED Notes (Signed)
Pt placed on bear hugger at this time.

## 2015-11-14 ENCOUNTER — Inpatient Hospital Stay (HOSPITAL_COMMUNITY): Payer: Medicare Other

## 2015-11-14 DIAGNOSIS — R404 Transient alteration of awareness: Secondary | ICD-10-CM

## 2015-11-14 DIAGNOSIS — G9340 Encephalopathy, unspecified: Secondary | ICD-10-CM

## 2015-11-14 DIAGNOSIS — G9341 Metabolic encephalopathy: Secondary | ICD-10-CM

## 2015-11-14 DIAGNOSIS — R569 Unspecified convulsions: Secondary | ICD-10-CM

## 2015-11-14 LAB — BASIC METABOLIC PANEL
ANION GAP: 23 — AB (ref 5–15)
Anion gap: 24 — ABNORMAL HIGH (ref 5–15)
BUN: 82 mg/dL — ABNORMAL HIGH (ref 6–20)
BUN: 85 mg/dL — ABNORMAL HIGH (ref 6–20)
CALCIUM: 7.8 mg/dL — AB (ref 8.9–10.3)
CHLORIDE: 103 mmol/L (ref 101–111)
CO2: 15 mmol/L — AB (ref 22–32)
CO2: 16 mmol/L — AB (ref 22–32)
CREATININE: 10.38 mg/dL — AB (ref 0.44–1.00)
Calcium: 8.1 mg/dL — ABNORMAL LOW (ref 8.9–10.3)
Chloride: 103 mmol/L (ref 101–111)
Creatinine, Ser: 10.25 mg/dL — ABNORMAL HIGH (ref 0.44–1.00)
GFR calc non Af Amer: 4 mL/min — ABNORMAL LOW (ref >60)
GFR, EST AFRICAN AMERICAN: 4 mL/min — AB (ref >60)
GFR, EST AFRICAN AMERICAN: 4 mL/min — AB (ref >60)
GFR, EST NON AFRICAN AMERICAN: 4 mL/min — AB (ref >60)
Glucose, Bld: 130 mg/dL — ABNORMAL HIGH (ref 65–99)
Glucose, Bld: 135 mg/dL — ABNORMAL HIGH (ref 65–99)
POTASSIUM: 2.8 mmol/L — AB (ref 3.5–5.1)
Potassium: 2.4 mmol/L — CL (ref 3.5–5.1)
Sodium: 142 mmol/L (ref 135–145)
Sodium: 142 mmol/L (ref 135–145)

## 2015-11-14 LAB — RENAL FUNCTION PANEL
ALBUMIN: 3.2 g/dL — AB (ref 3.5–5.0)
ALBUMIN: 3.2 g/dL — AB (ref 3.5–5.0)
ANION GAP: 24 — AB (ref 5–15)
Anion gap: 21 — ABNORMAL HIGH (ref 5–15)
BUN: 89 mg/dL — ABNORMAL HIGH (ref 6–20)
BUN: 95 mg/dL — ABNORMAL HIGH (ref 6–20)
CALCIUM: 7.5 mg/dL — AB (ref 8.9–10.3)
CHLORIDE: 98 mmol/L — AB (ref 101–111)
CO2: 17 mmol/L — ABNORMAL LOW (ref 22–32)
CO2: 21 mmol/L — ABNORMAL LOW (ref 22–32)
CREATININE: 11.3 mg/dL — AB (ref 0.44–1.00)
Calcium: 7.1 mg/dL — ABNORMAL LOW (ref 8.9–10.3)
Chloride: 102 mmol/L (ref 101–111)
Creatinine, Ser: 10.87 mg/dL — ABNORMAL HIGH (ref 0.44–1.00)
GFR calc non Af Amer: 3 mL/min — ABNORMAL LOW (ref >60)
GFR, EST AFRICAN AMERICAN: 4 mL/min — AB (ref >60)
GFR, EST AFRICAN AMERICAN: 4 mL/min — AB (ref >60)
GFR, EST NON AFRICAN AMERICAN: 3 mL/min — AB (ref >60)
GLUCOSE: 150 mg/dL — AB (ref 65–99)
Glucose, Bld: 105 mg/dL — ABNORMAL HIGH (ref 65–99)
PHOSPHORUS: 10.4 mg/dL — AB (ref 2.5–4.6)
PHOSPHORUS: 11.9 mg/dL — AB (ref 2.5–4.6)
POTASSIUM: 3.1 mmol/L — AB (ref 3.5–5.1)
POTASSIUM: 3.4 mmol/L — AB (ref 3.5–5.1)
SODIUM: 143 mmol/L (ref 135–145)
Sodium: 140 mmol/L (ref 135–145)

## 2015-11-14 LAB — HEPATIC FUNCTION PANEL
ALBUMIN: 3.6 g/dL (ref 3.5–5.0)
ALK PHOS: 92 U/L (ref 38–126)
ALT: 12 U/L — AB (ref 14–54)
AST: 14 U/L — AB (ref 15–41)
BILIRUBIN TOTAL: 1 mg/dL (ref 0.3–1.2)
Bilirubin, Direct: 0.2 mg/dL (ref 0.1–0.5)
Indirect Bilirubin: 0.8 mg/dL (ref 0.3–0.9)
Total Protein: 7 g/dL (ref 6.5–8.1)

## 2015-11-14 LAB — BLOOD GAS, ARTERIAL
ACID-BASE DEFICIT: 10.3 mmol/L — AB (ref 0.0–2.0)
Acid-base deficit: 9.2 mmol/L — ABNORMAL HIGH (ref 0.0–2.0)
BICARBONATE: 15.6 meq/L — AB (ref 20.0–24.0)
Bicarbonate: 16.6 mEq/L — ABNORMAL LOW (ref 20.0–24.0)
DRAWN BY: 437071
DRAWN BY: 437071
FIO2: 0.28
O2 CONTENT: 2 L/min
O2 Content: 2 L/min
O2 SAT: 96 %
O2 Saturation: 93.7 %
PATIENT TEMPERATURE: 97.6
PCO2 ART: 37.1 mmHg (ref 35.0–45.0)
PH ART: 7.267 — AB (ref 7.350–7.450)
Patient temperature: 97.3
TCO2: 16.7 mmol/L (ref 0–100)
TCO2: 17.7 mmol/L (ref 0–100)
pCO2 arterial: 36.4 mmHg (ref 35.0–45.0)
pH, Arterial: 7.251 — ABNORMAL LOW (ref 7.350–7.450)
pO2, Arterial: 95.6 mmHg (ref 80.0–100.0)
pO2, Arterial: 79.6 mmHg — ABNORMAL LOW (ref 80.0–100.0)

## 2015-11-14 LAB — GLUCOSE, CAPILLARY
GLUCOSE-CAPILLARY: 110 mg/dL — AB (ref 65–99)
Glucose-Capillary: 129 mg/dL — ABNORMAL HIGH (ref 65–99)
Glucose-Capillary: 130 mg/dL — ABNORMAL HIGH (ref 65–99)
Glucose-Capillary: 151 mg/dL — ABNORMAL HIGH (ref 65–99)
Glucose-Capillary: 69 mg/dL (ref 65–99)
Glucose-Capillary: 114 mg/dL — ABNORMAL HIGH (ref 65–99)
Glucose-Capillary: 89 mg/dL (ref 65–99)

## 2015-11-14 LAB — HEMOGLOBIN A1C
HEMOGLOBIN A1C: 6.1 % — AB (ref 4.8–5.6)
Mean Plasma Glucose: 128 mg/dL

## 2015-11-14 LAB — CBC
HCT: 27.7 % — ABNORMAL LOW (ref 36.0–46.0)
HEMATOCRIT: 29.2 % — AB (ref 36.0–46.0)
HEMATOCRIT: 30.8 % — AB (ref 36.0–46.0)
HEMOGLOBIN: 8.9 g/dL — AB (ref 12.0–15.0)
Hemoglobin: 9.8 g/dL — ABNORMAL LOW (ref 12.0–15.0)
Hemoglobin: 8.8 g/dL — ABNORMAL LOW (ref 12.0–15.0)
MCH: 23.6 pg — ABNORMAL LOW (ref 26.0–34.0)
MCH: 23.6 pg — ABNORMAL LOW (ref 26.0–34.0)
MCH: 22.9 pg — ABNORMAL LOW (ref 26.0–34.0)
MCHC: 30.1 g/dL (ref 30.0–36.0)
MCHC: 31.8 g/dL (ref 30.0–36.0)
MCHC: 32.1 g/dL (ref 30.0–36.0)
MCV: 73.5 fL — ABNORMAL LOW (ref 78.0–100.0)
MCV: 74 fL — AB (ref 78.0–100.0)
MCV: 76 fL — AB (ref 78.0–100.0)
Platelets: 269 10*3/uL (ref 150–400)
Platelets: 289 10*3/uL (ref 150–400)
Platelets: 332 10*3/uL (ref 150–400)
RBC: 3.77 MIL/uL — ABNORMAL LOW (ref 3.87–5.11)
RBC: 3.84 MIL/uL — AB (ref 3.87–5.11)
RBC: 4.16 MIL/uL (ref 3.87–5.11)
RDW: 21.7 % — ABNORMAL HIGH (ref 11.5–15.5)
RDW: 21.7 % — ABNORMAL HIGH (ref 11.5–15.5)
RDW: 21.8 % — ABNORMAL HIGH (ref 11.5–15.5)
WBC: 13.9 10*3/uL — ABNORMAL HIGH (ref 4.0–10.5)
WBC: 15.3 10*3/uL — AB (ref 4.0–10.5)
WBC: 15.6 10*3/uL — AB (ref 4.0–10.5)

## 2015-11-14 LAB — HEPATITIS B SURFACE ANTIGEN: HEP B S AG: NEGATIVE

## 2015-11-14 LAB — TROPONIN I
TROPONIN I: 0.64 ng/mL — AB (ref <0.031)
TROPONIN I: 0.85 ng/mL — AB (ref <0.031)
TROPONIN I: 0.89 ng/mL — AB (ref <0.031)
TROPONIN I: 1.03 ng/mL — AB (ref <0.031)

## 2015-11-14 LAB — PROTIME-INR
INR: 1.21 (ref 0.00–1.49)
Prothrombin Time: 15.5 seconds — ABNORMAL HIGH (ref 11.6–15.2)

## 2015-11-14 LAB — MAGNESIUM
MAGNESIUM: 1.8 mg/dL (ref 1.7–2.4)
MAGNESIUM: 1.8 mg/dL (ref 1.7–2.4)
MAGNESIUM: 1.9 mg/dL (ref 1.7–2.4)

## 2015-11-14 LAB — PROCALCITONIN
PROCALCITONIN: 0.39 ng/mL
PROCALCITONIN: 0.58 ng/mL

## 2015-11-14 LAB — LACTIC ACID, PLASMA
LACTIC ACID, VENOUS: 2.1 mmol/L — AB (ref 0.5–2.0)
Lactic Acid, Venous: 0.8 mmol/L (ref 0.5–2.0)

## 2015-11-14 LAB — PHOSPHORUS
PHOSPHORUS: 10.3 mg/dL — AB (ref 2.5–4.6)
Phosphorus: 9.8 mg/dL — ABNORMAL HIGH (ref 2.5–4.6)
Phosphorus: 11.9 mg/dL — ABNORMAL HIGH (ref 2.5–4.6)

## 2015-11-14 LAB — TSH: TSH: 5.255 u[IU]/mL — AB (ref 0.350–4.500)

## 2015-11-14 MED ORDER — LACTULOSE 10 GM/15ML PO SOLN
30.0000 g | Freq: Three times a day (TID) | ORAL | Status: DC
  Administered 2015-11-14 – 2015-11-21 (×19): 30 g via ORAL
  Filled 2015-11-14 (×24): qty 45

## 2015-11-14 MED ORDER — LIDOCAINE HCL (PF) 1 % IJ SOLN: 5.0000 mL | INTRAMUSCULAR | Status: DC | PRN

## 2015-11-14 MED ORDER — ALTEPLASE 2 MG IJ SOLR
2.0000 mg | Freq: Once | INTRAMUSCULAR | Status: DC | PRN
  Filled 2015-11-14: qty 2

## 2015-11-14 MED ORDER — PANTOPRAZOLE SODIUM 40 MG IV SOLR
40.0000 mg | INTRAVENOUS | Status: DC
  Administered 2015-11-15 – 2015-11-16 (×2): 40 mg via INTRAVENOUS
  Filled 2015-11-14 (×3): qty 40

## 2015-11-14 MED ORDER — WHITE PETROLATUM GEL
Status: AC
  Administered 2015-11-14: 02:00:00
  Filled 2015-11-14: qty 1

## 2015-11-14 MED ORDER — HEPARIN SODIUM (PORCINE) 1000 UNIT/ML DIALYSIS: 1000.0000 [IU] | INTRAMUSCULAR | Status: DC | PRN

## 2015-11-14 MED ORDER — SODIUM CHLORIDE 0.9 % IV SOLN: 100.0000 mL | INTRAVENOUS | Status: DC | PRN

## 2015-11-14 MED ORDER — DEXTROSE 50 % IV SOLN
25.0000 mL | Freq: Once | INTRAVENOUS | Status: AC
  Administered 2015-11-14: 25 mL via INTRAVENOUS
  Filled 2015-11-14: qty 50

## 2015-11-14 MED ORDER — PENTAFLUOROPROP-TETRAFLUOROETH EX AERO: 1.0000 "application " | INHALATION_SPRAY | CUTANEOUS | Status: DC | PRN

## 2015-11-14 MED ORDER — INSULIN ASPART 100 UNIT/ML ~~LOC~~ SOLN
0.0000 [IU] | SUBCUTANEOUS | Status: DC
  Administered 2015-11-14: 2 [IU] via SUBCUTANEOUS
  Administered 2015-11-14 – 2015-11-18 (×4): 1 [IU] via SUBCUTANEOUS
  Administered 2015-11-18: 2 [IU] via SUBCUTANEOUS
  Administered 2015-11-18: 3 [IU] via SUBCUTANEOUS
  Administered 2015-11-18: 2 [IU] via SUBCUTANEOUS
  Administered 2015-11-18: 1 [IU] via SUBCUTANEOUS
  Administered 2015-11-18 (×2): 2 [IU] via SUBCUTANEOUS
  Administered 2015-11-19: 1 [IU] via SUBCUTANEOUS
  Administered 2015-11-19 (×2): 5 [IU] via SUBCUTANEOUS
  Administered 2015-11-20: 3 [IU] via SUBCUTANEOUS
  Administered 2015-11-20 (×3): 1 [IU] via SUBCUTANEOUS
  Administered 2015-11-20 – 2015-11-21 (×2): 2 [IU] via SUBCUTANEOUS
  Administered 2015-11-21: 1 [IU] via SUBCUTANEOUS

## 2015-11-14 MED ORDER — LIDOCAINE-PRILOCAINE 2.5-2.5 % EX CREA: 1.0000 "application " | TOPICAL_CREAM | CUTANEOUS | Status: DC | PRN

## 2015-11-14 MED ORDER — MIDAZOLAM HCL 2 MG/2ML IJ SOLN
INTRAMUSCULAR | Status: AC
  Filled 2015-11-14: qty 4

## 2015-11-14 MED ORDER — POTASSIUM CHLORIDE 20 MEQ/15ML (10%) PO SOLN
20.0000 meq | Freq: Once | ORAL | Status: AC
  Administered 2015-11-14: 20 meq
  Filled 2015-11-14: qty 15

## 2015-11-14 MED ORDER — PRO-STAT SUGAR FREE PO LIQD
30.0000 mL | Freq: Two times a day (BID) | ORAL | Status: DC
  Administered 2015-11-14: 30 mL
  Filled 2015-11-14 (×2): qty 30

## 2015-11-14 MED ORDER — FENTANYL CITRATE (PF) 100 MCG/2ML IJ SOLN
INTRAMUSCULAR | Status: AC
  Administered 2015-11-14: 100 ug
  Filled 2015-11-14: qty 2

## 2015-11-14 MED ORDER — MIDAZOLAM HCL 2 MG/2ML IJ SOLN
INTRAMUSCULAR | Status: AC
  Administered 2015-11-14: 2 mg
  Filled 2015-11-14: qty 2

## 2015-11-14 MED ORDER — LORAZEPAM 2 MG/ML IJ SOLN
2.0000 mg | Freq: Once | INTRAMUSCULAR | Status: AC
  Administered 2015-11-14: 2 mg via INTRAVENOUS

## 2015-11-14 MED ORDER — SODIUM CHLORIDE 0.9 % IV SOLN
500.0000 mg | Freq: Two times a day (BID) | INTRAVENOUS | Status: DC
  Administered 2015-11-14 (×2): 500 mg via INTRAVENOUS
  Filled 2015-11-14 (×3): qty 5

## 2015-11-14 MED ORDER — VITAL HIGH PROTEIN PO LIQD: 1000.0000 mL | ORAL | Status: DC

## 2015-11-14 MED ORDER — LIDOCAINE-PRILOCAINE 2.5-2.5 % EX CREA
1.0000 "application " | TOPICAL_CREAM | CUTANEOUS | Status: DC | PRN
  Filled 2015-11-14: qty 5

## 2015-11-14 MED ORDER — DEXTROSE 50 % IV SOLN
INTRAVENOUS | Status: AC
  Filled 2015-11-14: qty 50

## 2015-11-14 MED ORDER — PRO-STAT SUGAR FREE PO LIQD
30.0000 mL | Freq: Every day | ORAL | Status: DC
  Administered 2015-11-15: 30 mL
  Filled 2015-11-14 (×3): qty 30

## 2015-11-14 MED ORDER — NICARDIPINE HCL IN NACL 20-0.86 MG/200ML-% IV SOLN
5.0000 mg/h | INTRAVENOUS | Status: DC
  Administered 2015-11-14: 2 mg/h via INTRAVENOUS
  Administered 2015-11-14: 5 mg/h via INTRAVENOUS
  Filled 2015-11-14 (×2): qty 200

## 2015-11-14 MED ORDER — LORAZEPAM 2 MG/ML IJ SOLN
INTRAMUSCULAR | Status: AC
  Filled 2015-11-14: qty 1

## 2015-11-14 MED ORDER — NEPRO/CARBSTEADY PO LIQD
1000.0000 mL | ORAL | Status: DC
  Administered 2015-11-14 – 2015-11-15 (×2): 1000 mL via ORAL
  Filled 2015-11-14 (×5): qty 1000

## 2015-11-14 MED ORDER — SODIUM CHLORIDE 0.9 % IV SOLN
250.0000 mg | INTRAVENOUS | Status: DC
  Administered 2015-11-14 – 2015-11-16 (×2): 250 mg via INTRAVENOUS
  Filled 2015-11-14 (×3): qty 2.5

## 2015-11-14 MED ORDER — FENTANYL CITRATE (PF) 100 MCG/2ML IJ SOLN
INTRAMUSCULAR | Status: AC
  Filled 2015-11-14: qty 4

## 2015-11-14 MED ORDER — SODIUM CHLORIDE 0.9 % IV SOLN
500.0000 mg | INTRAVENOUS | Status: DC
  Administered 2015-11-15 – 2015-11-18 (×4): 500 mg via INTRAVENOUS
  Filled 2015-11-14 (×4): qty 5

## 2015-11-14 MED ORDER — VANCOMYCIN HCL 500 MG IV SOLR
500.0000 mg | Freq: Once | INTRAVENOUS | Status: AC
  Administered 2015-11-14: 500 mg via INTRAVENOUS
  Filled 2015-11-14 (×2): qty 500

## 2015-11-14 MED ORDER — ATROPINE SULFATE 0.1 MG/ML IJ SOLN
INTRAMUSCULAR | Status: AC
  Filled 2015-11-14: qty 10

## 2015-11-14 MED ORDER — POTASSIUM CHLORIDE 10 MEQ/100ML IV SOLN
10.0000 meq | INTRAVENOUS | Status: AC
  Administered 2015-11-14 (×4): 10 meq via INTRAVENOUS
  Filled 2015-11-14 (×4): qty 100

## 2015-11-14 MED ORDER — SODIUM CHLORIDE 0.9 % IV SOLN
500.0000 mg | INTRAVENOUS | Status: DC | PRN
  Filled 2015-11-14: qty 500

## 2015-11-14 MED ORDER — ALTEPLASE 2 MG IJ SOLR: 2.0000 mg | Freq: Once | INTRAMUSCULAR | Status: DC | PRN

## 2015-11-14 MED ORDER — LORAZEPAM 2 MG/ML IJ SOLN
2.0000 mg | INTRAMUSCULAR | Status: DC | PRN
Start: 2015-11-14 — End: 2015-11-21
  Administered 2015-11-14 (×2): 2 mg via INTRAVENOUS
  Filled 2015-11-14 (×3): qty 1

## 2015-11-14 NOTE — Progress Notes (Signed)
Dialysis treatment completed.  500 mL ultrafiltrated.  0 mL net fluid removal.  Patient intubated, sedated. Lung sounds diminished to ausculation in all fields. Generalzied BLE edema. Cardiac: Regular R&R, some ectopy noted.  Cleansed RIJ catheter with chlorhexidine.  Disconnected lines and flushed ports with saline per protocol.  Ports locked with heparin and capped per protocol.    Report given to bedside, RN Ivin Booty.  Pt had one seizure during treatment, lasting approximately 2 minutes.  BFR and DFR slowed briefly during episode.  Bedside RN notified, ativan administered.    Pt intubated towards end of treatment.

## 2015-11-14 NOTE — Procedures (Signed)
Intubation Procedure Note Jane Higgins [id number] 09-14-1956  Procedure: Intubation Indications: Respiratory insufficiency  Procedure Details Consent: Unable to obtain consent because of emergent medical necessity. Time Out: Verified patient identification, verified procedure, site/side was marked, verified correct patient position, special equipment/implants available, medications/allergies/relevent history reviewed, required imaging and test results available.  Performed  Maximum sterile technique was used including cap, gloves, gown, hand hygiene and mask.  MAC and 4- glide    Evaluation Hemodynamic Status: BP stable throughout; O2 sats: stable throughout Patient's Current Condition: stable Complications: No apparent complications Patient did tolerate procedure well. Chest X-ray ordered to verify placement.  CXR: pending.   Nelda Bucks 11/14/2015  Son notified Yellow secretions all over cords and out from ett once placed  [employer]. Tyson Alias, MD, FACP Pgr: [phone] [facility]

## 2015-11-14 NOTE — Consult Note (Signed)
NEURO HOSPITALIST CONSULT NOTE   Requestig physician: Dr. Marchelle Gearing   Reason for Consult: seizure  HPI:                                                                                                                                          Jane Higgins is an 60 y.o. female with end-stage renal disease, diabetes, hypertension, bipolar disorder and cachexia with protein calorie malnutrition. Prior to admission for 24 hours she had progressive change in mental status. She was also noticed to be hypothermic with an elevated white count of 14,000 and a creatinine of 18 but without any apparent source of infection. From the emergency department she straight went to the dialysis suite where she was dialyzed off 1.5 L of fluid. According to nursing report she remained unresponsive except to name call in the dialysis unit. Upon return to stepdown unit she continued to remain unresponsive and at some point in time she was noticed to be foaming around her mouth associated with bradycardia and desaturations. Blood pressure was high between 170 and 200 systolic. This AM patient had one TC seizure which was self limited and patietn was given 2 mg Ativan. K+  Is 2.4, WBC 13.9.   After seizure she was started on Keppra 500 mg BID.   Past Medical History  Diagnosis Date  . Bipolar 1 disorder (HCC)   . Hypertension   . Diabetes mellitus without complication (HCC)   . ESRD (end stage renal disease) [facility])     Past Surgical History  Procedure Laterality Date  . Tubal ligation    . Av fistula placement Right 06/03/2015    Procedure: ARTERIOVENOUS (AV) FISTULA CREATION ;  Surgeon: Larina Earthly, MD;  Location: [facility] OR;  Service: Vascular;  Laterality: Right;    Family History  Problem Relation Age of Onset  . Colon cancer      Neg history  . Kidney disease Cousin      Social History:  reports that she has been smoking Cigarettes.  She has been smoking about 1.00 pack per day. She  does not have any smokeless tobacco history on file. She reports that she does not drink alcohol or use illicit drugs.  Allergies  Allergen Reactions  . Iodine Other (See Comments)    unknown    MEDICATIONS:  Prior to Admission:  Prescriptions prior to admission  Medication Sig Dispense Refill Last Dose  . amLODipine (NORVASC) 5 MG tablet Take 1 tablet (5 mg total) by mouth daily. (Patient not taking: Reported on 10/04/2015) 30 tablet 3 Not Taking at Unknown time  . calcium acetate (PHOSLO) 667 MG capsule Take 3 capsules (2,001 mg total) by mouth 3 (three) times daily with meals. 90 capsule 1   . insulin NPH-regular Human (NOVOLIN 70/30) (70-30) 100 UNIT/ML injection Inject 5 Units into the skin 2 (two) times daily with a meal. (Patient not taking: Reported on 10/04/2015) 10 mL 12 Not Taking at Unknown time  . metoprolol tartrate (LOPRESSOR) 25 MG tablet Take 1 tablet (25 mg total) by mouth 2 (two) times daily. (Patient not taking: Reported on 10/04/2015) 60 tablet 3 Not Taking at Unknown time  . multivitamin (RENA-VIT) TABS tablet Take 1 tablet by mouth at bedtime. (Patient not taking: Reported on 10/04/2015) 100 tablet 0 Not Taking at Unknown time  . Nutritional Supplements (FEEDING SUPPLEMENT, NEPRO CARB STEADY,) LIQD Take 237 mLs by mouth 2 (two) times daily between meals. (Patient not taking: Reported on 10/04/2015) 237 mL 0 Not Taking at Unknown time  . Triprolidine-Pseudoephedrine (ANTIHISTAMINE PO) Take 1 tablet by mouth daily as needed (congestion).   PRN   Scheduled: . feeding supplement (PRO-STAT SUGAR FREE 64)  30 mL Per Tube BID  . feeding supplement (VITAL HIGH PROTEIN)  1,000 mL Per Tube Q24H  . fentaNYL      . heparin  5,000 Units Subcutaneous 3 times per day  . hydrALAZINE  10 mg Intravenous Once  . insulin aspart  0-9 Units Subcutaneous 6 times per day  .  lactulose  30 g Oral TID  . levETIRAcetam  500 mg Intravenous Q12H  . piperacillin-tazobactam (ZOSYN)  IV  2.25 g Intravenous 3 times per day  . sodium chloride flush  3 mL Intravenous Q12H     ROS:                                                                                                                                       History obtained from unobtainable from patient due to mental status    Blood pressure 131/55, pulse 88, temperature 97.6 F (36.4 C), temperature source Oral, resp. rate 17, height 5\' 5"  (1.651 m), weight 54.9 kg (121 lb 0.5 oz), SpO2 97 %.   Neurologic Examination:  HEENT-  Normocephalic, no lesions, without obvious abnormality.  Normal external eye and conjunctiva.  Normal TM's bilaterally.  Normal auditory canals and external ears. Normal external nose, mucus membranes and septum.  Normal pharynx. Cardiovascular- S1, S2 normal, pulses palpable throughout   Lungs- chest clear, no wheezing, rales, normal symmetric air entry Abdomen- normal findings: bowel sounds normal Extremities- no edema Lymph-no adenopathy palpable Musculoskeletal-no joint tenderness, deformity or swelling Skin-warm and dry, no hyperpigmentation, vitiligo, or suspicious lesions  Neurological Examination Mental Status: Eyes closed, non-verbal, not following commands. No spontaneous eye opening Cranial Nerves: II:  pupils equal, round, reactive to light  III,IV, VI: ptosis not present, eyes midline, no gaze deviaiton V,VII: face symmetric, corneal reflex intact VIII: unable to test IX,X: uvula rises symmetrically XI: unable to test XII: unable to test Motor: No spontaneous movement, no abnormal movements noted Sensory: Grimaces to noxious stimuli, no withdrawal noted Deep Tendon Reflexes: 1+ and symmetric throughout Plantars: Right: downgoing   Left:  downgoing Cerebellar: Unable to test Gait: unable to test      Lab Results: Basic Metabolic Panel:  Recent Labs Lab 11/13/15 1240 11/13/15 1559 11/14/15 0024 11/14/15 0332 11/14/15 0908  NA 144  --  142 142  --   K 5.0  --  2.8* 2.4*  --   CL 112*  --  103 103  --   CO2 <7*  --  15* 16*  --   GLUCOSE 172*  --  130* 135*  --   BUN 173*  --  82* 85*  --   CREATININE 18.32* 18.63* 10.38* 10.25*  --   CALCIUM 6.5*  --  8.1* 7.8*  --   MG  --   --   --   --  1.8  PHOS  --   --   --   --  9.8*    Liver Function Tests:  Recent Labs Lab 11/13/15 1240 11/14/15 0024  AST 10* 14*  ALT 9* 12*  ALKPHOS 86 92  BILITOT 1.0 1.0  PROT 6.7 7.0  ALBUMIN 3.5 3.6   No results for input(s): LIPASE, AMYLASE in the last 168 hours.  Recent Labs Lab 11/13/15 1240  AMMONIA 95*    CBC:  Recent Labs Lab 11/13/15 1240 11/13/15 1559 11/14/15 0024 11/14/15 0332  WBC 14.0* 13.3* 15.3* 13.9*  NEUTROABS 12.7*  --   --   --   HGB 9.0* 8.6* 9.8* 8.9*  HCT 29.4* 28.7* 30.8* 27.7*  MCV 77.6* 78.4 74.0* 73.5*  PLT 309 291 289 269    Cardiac Enzymes:  Recent Labs Lab 11/13/15 1240 11/13/15 2140 11/14/15 0024 11/14/15 0908  TROPONINI 0.37* 0.91* 1.03* 0.89*    Lipid Panel: No results for input(s): CHOL, TRIG, HDL, CHOLHDL, VLDL, LDLCALC in the last 168 hours.  CBG:  Recent Labs Lab 11/13/15 2118 11/14/15 0355 11/14/15 0749  GLUCAP 104* 129* 110*    Microbiology: Results for orders placed or performed during the hospital encounter of 11/13/15  MRSA PCR Screening     Status: None   Collection Time: 11/13/15  9:09 PM  Result Value Ref Range Status   MRSA by PCR NEGATIVE NEGATIVE Final    Comment:        The GeneXpert MRSA Assay (FDA approved for NASAL specimens only), is one component of a comprehensive MRSA colonization surveillance program. It is not intended to diagnose MRSA infection nor to guide or monitor treatment for MRSA infections.      Coagulation Studies:  Recent Labs  11/14/15 0024  LABPROT 15.5*  INR 1.21    Imaging: Dg Chest 2 View  11/13/2015  CLINICAL DATA:  Left-sided weakness.  Dialysis patient. EXAM: CHEST  2 VIEW COMPARISON:  10/04/2015 FINDINGS: Right jugular central venous catheter tip in the right atrium is unchanged. Negative for edema. Lungs are clear without infiltrate or effusion. Cardiac enlargement unchanged. IMPRESSION: No active cardiopulmonary disease. Electronically Signed   By: Marlan Palau M.D.   On: 11/13/2015 13:50   Ct Head Wo Contrast  11/14/2015  CLINICAL DATA:  Acute onset of altered mental status. Initial encounter. EXAM: CT HEAD WITHOUT CONTRAST TECHNIQUE: Contiguous axial images were obtained from the base of the skull through the vertex without intravenous contrast. COMPARISON:  CT of the head performed earlier today at 1:48 p.m. FINDINGS: There is no evidence of acute infarction, mass lesion, or intra- or extra-axial hemorrhage on CT. 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 Mild periventricular white matter change likely reflects small vessel ischemic microangiopathy. The brainstem and fourth ventricle are within normal limits. The basal ganglia are unremarkable in appearance. The cerebral hemispheres demonstrate grossly normal gray-white differentiation. No mass effect or midline shift is seen. There is no evidence of fracture; visualized osseous structures are unremarkable in appearance. The orbits are within normal limits. The paranasal sinuses and mastoid air cells are well-aerated. No significant soft tissue abnormalities are seen. IMPRESSION: 1. No acute intracranial pathology seen on CT. 2. Chronic right posterior parietal infarct again noted, with associated encephalomalacia. 3. Mild small vessel ischemic microangiopathy. Electronically Signed   By: Roanna Raider M.D.   On: 11/14/2015 00:01   Ct Head Wo Contrast  11/13/2015   CLINICAL DATA:  SIGNIFICANT LEFT SIDED WEAKNESS X 2 DAYS SLURRED SPEECH PMH: DM, HTN, Bipolar 1 disorder (HCC); ESRD EXAM: CT HEAD WITHOUT CONTRAST TECHNIQUE: Contiguous axial images were obtained from the base of the skull through the vertex without intravenous contrast. COMPARISON:  10/04/2015 FINDINGS: Remote right parietal infarct again noted no associated hemorrhage. No evidence for acute extension of the infarct. There is no intra or extra-axial fluid collection or mass lesion. The basilar cisterns and ventricles have a normal appearance. There is no CT evidence for acute infarction or hemorrhage. Bone windows show significant atherosclerotic calcification of the internal carotid arteries. No acute calvarial injury. Mild mucoperiosteal thickening of the ethmoid air cells. IMPRESSION: 1. 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 2.  No evidence for acute intracranial abnormality. 3. Mild sinusitis. Electronically Signed   By: Norva Pavlov M.D.   On: 11/13/2015 14:24   Dg Abd Portable 1v  11/14/2015  CLINICAL DATA:  Nasogastric tube placement.  Initial encounter. EXAM: PORTABLE ABDOMEN - 1 VIEW COMPARISON:  Abdominal radiograph performed 05/28/2015 FINDINGS: The patient's enteric tube is seen coiling within the body of the stomach. The visualized bowel gas pattern is unremarkable. Scattered air and stool filled loops of colon are seen; no abnormal dilatation of small bowel loops is seen to suggest small bowel obstruction. No free intra-abdominal air is identified, though evaluation for free air is limited on a single supine view. The visualized osseous structures are within normal limits; the sacroiliac joints are unremarkable in appearance. The visualized lung bases are essentially clear. Scattered calcifications are seen within the pelvis. IMPRESSION: Enteric tube seen coiling within the body of the stomach. Electronically Signed   By: Roanna Raider M.D.   On: 11/14/2015 01:05  Assessment and plan per attending neurologist  Felicie Morn PA-C [facility] [phone]  11/14/2015, 10:53 AM   Assessment/Plan:  59y/o woman admitted with ESRD, DM, HTN, bipolar disorder admitted with progressive decline in mental status. Admitted with possible SIRS and scheduled for emergent dialysis. While in the hospital had 2 episodes concerning for seizure activity. Started on keppra after initial episode. Per records has had at least one possible seizure in the past. Suspect provoked seizures in the setting of multiple metabolic and infectious processes.   -change keppra to 500mg  daily with additional 250mg  post HD -follow up EEG -will continue to follow as needed   Elspeth Cho, DO [facility] [phone]  If 7pm- 7am, please page neurology on call as listed in AMION.

## 2015-11-14 NOTE — Progress Notes (Signed)
  of fent was washed in sink with Ephriam Knuckles, Press photographer. Pt was not intubated therefore medication wasn't needed.

## 2015-11-14 NOTE — Progress Notes (Signed)
Arrived to patient room 75M-09 at 1900.  Reviewed treatment plan and this RN agrees with plan.  Report received from bedside RN, Ivin Booty.  Consent obtained.  Patient obtunded.   Lung sounds rhonchi to ausculation in all fields. Genealized edema. Cardiac:  Regular rate, some ectopy noted.  Removed caps and cleansed RIJ catheter with chlorhedxidine.  Aspirated ports of heparin and flushed them with saline per protocol.  Connected and secured lines, initiated treatment at 1923.  UF Goal of and net fluid removal 0L.  Will continue to monitor.

## 2015-11-14 NOTE — Progress Notes (Addendum)
eLink Physician-Brief Progress Note Patient Name: Jane Higgins DOB: 1956-08-22 MRN: [id number]   Date of Service  11/14/2015  HPI/Events of Note  Camera Chk on patient after transfer to ICU. Still not answering questions & altered. Opens eyes intermittently. Troponin I 1.03 & LA 2.9.  eICU Interventions  1. Ordering TTE for AM 2. Trending Troponin I 3. Trending LA 4. Neuro Checks q2hour 5. Seizure Precautions 6. Low threshold for intubation     Intervention Category Major Interventions: Change in mental status - evaluation and management  Lawanda Cousins 11/14/2015, 1:56 AM

## 2015-11-14 NOTE — Progress Notes (Signed)
Called pts. Son, Marita [place] and notified him that pt was  transferred to 2M09.

## 2015-11-14 NOTE — Progress Notes (Signed)
eLink Physician-Brief Progress Note Patient Name: DEVAEH AMADI DOB: 12/04/1955 MRN: [id number]   Date of Service  11/14/2015  HPI/Events of Note  RN notified of significant hypokalemia. LA improving. Known ESRD on HD.  eICU Interventions  1. KCl VT once 2. KCl x 4 runs 3. Repeat Renal Panel at 10am 4. Add-on Magnesium & Phos to AM labs     Intervention Category Major Interventions: Electrolyte abnormality - evaluation and management  Lawanda Cousins 11/14/2015, 4:40 AM

## 2015-11-14 NOTE — Progress Notes (Signed)
Pt found to be seizing in bed at 0745. Resident called to bedside and orders given for  Ativan. Heart rate fell to 47 BPM, O2 saturation dropped to to 77%. Oxygen increased to 6L via . Seizure lasted only approximately 1-2 minutes. Pt spontaneously recovered heart rate and oxygen level with no further interventions. O2 decreased to 4L with O2 saturation in high 90s. Will continue to closely monitor.

## 2015-11-14 NOTE — Progress Notes (Signed)
NG tube advanced per Dr. Isaiah Serge. Portable abdominal xray ordered to confirm placement before start of tube feeds.

## 2015-11-14 NOTE — Progress Notes (Signed)
Initial Nutrition Assessment  DOCUMENTATION CODES:   Non-severe (moderate) malnutrition in context of chronic illness  INTERVENTION:    Initiate TF via NGT with Nepro at 25 ml/h and Prostat 30 ml once daily on day 1; on day 2, increase to goal rate of 40 ml/h (960 ml per day) to provide 1828 kcals, 93 gm protein, 698 ml free water daily.  NUTRITION DIAGNOSIS:   Malnutrition related to chronic illness as evidenced by mild depletion of body fat, moderate depletion of body fat, moderate depletions of muscle mass, severe depletion of muscle mass.  GOAL:   Patient will meet greater than or equal to 90% of their needs  MONITOR:   Labs, Weight trends, TF tolerance, I & O's  REASON FOR ASSESSMENT:   Consult Enteral/tube feeding initiation and management  ASSESSMENT:   59 year old female with end-stage renal disease, diabetes, hypertension, bipolar disorder and cachexia with protein calorie malnutrition. Was brought into the hospital by her son according to chart review after she has missezd dialysis for the last few weeks.  Labs reviewed: potassium low, phosphorus high.  S/P seizure this AM. Low potassium being replaced. Patient with continued uremic encephalopathy. May require intubation. Patient's son (POA) is insisting on dialysis. Nutrition-Focused physical exam completed. Findings are mild-moderate fat depletion, mild-moderate and severe muscle depletion, and mild edema. Patient with at least moderate malnutrition.  Diet Order:  Diet NPO time specified  Skin:  Reviewed, no issues  Last BM:  2/6  Height:   Ht Readings from Last 1 Encounters:  11/14/15  (1.651 m)    Weight:   Wt Readings from Last 1 Encounters:  11/14/15 121 lb 0.5 oz (54.9 kg)    Ideal Body Weight:  56.8 kg  BMI:  Body mass index is 20.14 kg/(m^2).  Estimated Nutritional Needs:   Kcal:  1700-1900  Protein:  85-95 gm  Fluid:  1.2 L  EDUCATION NEEDS:   No education needs identified  at this time  Joaquin Courts, RD, LDN, CNSC Pager [phone] After Hours Pager [phone]

## 2015-11-14 NOTE — Progress Notes (Addendum)
  [facility] Progress Note   Subjective: not responding  Filed Vitals:   11/14/15 0700 11/14/15 0758 11/14/15 0800 11/14/15 0900  BP: 115/63  163/57 141/62  Pulse: 70  87 89  Temp:  97.6 F (36.4 C)    TempSrc:  Oral    Resp: '11  16 17  '$ Height:      Weight:      SpO2: 100%  98% 98%    Inpatient medications: . fentaNYL      . heparin  5,000 Units Subcutaneous 3 times per day  . hydrALAZINE  10 mg Intravenous Once  . insulin aspart  0-9 Units Subcutaneous 6 times per day  . lactulose  30 g Oral TID  . levETIRAcetam  500 mg Intravenous Q12H  . piperacillin-tazobactam (ZOSYN)  IV  2.25 g Intravenous 3 times per day  . potassium chloride  10 mEq Intravenous Q1 Hr x 4  . sodium chloride flush  3 mL Intravenous Q12H   . niCARDipine 2 mg/hr (11/14/15 0518)   sodium chloride, sodium chloride, sodium chloride, acetaminophen **OR** acetaminophen, alteplase, heparin, heparin, hydrALAZINE, lidocaine (PF), lidocaine-prilocaine, morphine injection, ondansetron **OR** ondansetron (ZOFRAN) IV, pentafluoroprop-tetrafluoroeth, senna-docusate, sodium chloride flush  Exam: Obtunded, unresponsive +JVD Chest coarse bs bilat RRR no rmg Abd soft nd no ascites Ext 1+ edema bilat R IJ cath clear exit site, RUA AVF +bruit  D[facility]: [facility]  4h  2/3.5 bath  400/800  Heparin none  R IJ cath (RUA AVF 8/26) MIrcera 225 q 2wks Hect 8 ug tiw      Assessment: 1 ESRD w missed HD two weeks- similar presentation in December.  Palliative care is consulting, poor candidate for continued OP dialysis given the situation described in previous notes.  HD again today 2 AMS post-ictal +/- uremia 3 HTN  4 Met acidosis 5 MBD 6 Bipolar 7 DM2   Plan - HD today. Palliative care consult.    Kelly Splinter MD [facility] pager [phone]    cell [phone] 11/14/2015, 9:56 AM    Recent Labs Lab 11/13/15 1240 11/13/15 1559 11/14/15 0024 11/14/15 0332  NA 144  --  142 142   K 5.0  --  2.8* 2.4*  CL 112*  --  103 103  CO2 <7*  --  15* 16*  GLUCOSE 172*  --  130* 135*  BUN 173*  --  82* 85*  CREATININE 18.32* 18.63* 10.38* 10.25*  CALCIUM 6.5*  --  8.1* 7.8*    Recent Labs Lab 11/13/15 1240 11/14/15 0024  AST 10* 14*  ALT 9* 12*  ALKPHOS 86 92  BILITOT 1.0 1.0  PROT 6.7 7.0  ALBUMIN 3.5 3.6    Recent Labs Lab 11/13/15 1240 11/13/15 1559 11/14/15 0024 11/14/15 0332  WBC 14.0* 13.3* 15.3* 13.9*  NEUTROABS 12.7*  --   --   --   HGB 9.0* 8.6* 9.8* 8.9*  HCT 29.4* 28.7* 30.8* 27.7*  MCV 77.6* 78.4 74.0* 73.5*  PLT 309 291 289 269

## 2015-11-14 NOTE — Progress Notes (Signed)
UR Completed. Milisa Kimbell, RN, BSN.  [phone] 

## 2015-11-14 NOTE — Procedures (Signed)
HPI:  60 year old female with end-stage renal disease, diabetes, hypertension, bipolar disorder and cachexia with protein calorie malnutrition. Was brought into the hospital by her son according to chart review after she has missed dialysis for the last few weeks. Prior to admission for 24 hours she had progressive change in mental status. She was also noticed to be hypothermic with an elevated white count of 14,000 and a creatinine of 18 but without any apparent source of infection. From the emergency department she straight went to the dialysis suite where she was dialyzed off 1.5 L of fluid. According to nursing report she remained unresponsive except to name call in the dialysis unit. Upon return to stepdown unit she continued to remain unresponsive and at some point in time she was noticed to be foaming around her mouth associated with bradycardia and desaturations. Blood pressure was high between 170 and 200 systolic. Critical care medicine was then consulted. Phone conversation with neurology it appears most likely differential diagnosis is uremic encephalopathy.  Prior to EEG, pt found to be seizing in bed at 0745. Resident called to bedside and orders given for  Ativan. Heart rate fell to 47 BPM, O2 saturation dropped to to 77%. Oxygen increased to 6L via . Seizure lasted only approximately 1-2 minutes.   TECHNICAL SUMMARY:  A multichannel referential and bipolar montage EEG using the standard international 10-20 system was performed on the patient described as unresponsive.  The dominant background activity consists of 4-5 hertz activity seen over all head regions.  Minimal beta activity is noted  ACTIVATION:  Painful stimuli to the toe revealed no changes in the background activity  EPILEPTIFORM ACTIVITY:  There were no spikes, sharp waves or paroxysmal activity.  SLEEP:  None   IMPRESSION:  This is an abnormal EEG demonstrating a moderate to moderately severe diffuse slowing of  electrocerebral activity.  This can be seen in a wide variety of encephalopathic state including those of a toxic, metabolic, or degenerative nature.  There were no focal, hemispheric, or lateralizing features.  No epileptiform activity was recorded.

## 2015-11-14 NOTE — Progress Notes (Signed)
eLink Physician-Brief Progress Note Patient Name: Jane Higgins DOB: 1955/10/19 MRN: [id number]   Date of Service  11/14/2015  HPI/Events of Note  Notified on need for stress ulcer prophylaxis in this intubated and ventilated patient.  eICU Interventions  Will order Protonix IV.      Intervention Category Intermediate Interventions: Best-practice therapies (e.g. DVT, beta blocker, etc.)  Shemika Robbs Eugene 11/14/2015, 11:51 PM

## 2015-11-14 NOTE — Progress Notes (Signed)
Pt seized for approximately 1-2 minutes at 1400 during bath. Tube feeds turned off, resident called to bedside who gave orders for  ativan. Heart rate and oxygen level dropped to a minimum of 60s and 64% respectively. Pt placed on non-rebreather mask. Oxygen saturation and heart rate recovered within 1 minute. Will continue to monitor.

## 2015-11-14 NOTE — Progress Notes (Signed)
Pt tube feeds held as she was intermittently seizing during my shift. Elink MD made aware, communicated to nightshift. Held lactulose.

## 2015-11-14 NOTE — Progress Notes (Signed)
PULMONARY / CRITICAL CARE MEDICINE   Name: Jane Higgins MRN: [id number] DOB: 24-Oct-1955    ADMISSION DATE:  11/13/2015 CONSULTATION DATE:  11/14/15   REFERRING MD:  [facility]alist and rapid response call  CHIEF COMPLAINT:  Acue encephalopathy  HISTORY OF PRESENT ILLNESS:   60 year old female with end-stage renal disease, diabetes, hypertension, bipolar disorder and cachexia with protein calorie malnutrition. Was brought into the hospital by her son according to chart review after she has missezd dialysis for the last few weeks. Prior to admission for 24 hours she had progressive change in mental status. She was also noticed to be hypothermic with an elevated white count of 14,000 and a creatinine of 18 but without any apparent source of infection. From the emergency department she straight went to the dialysis suite where she was dialyzed off 1.5 L of fluid. According to nursing report she remained unresponsive except to name call in the dialysis unit. Upon return to stepdown unit she continued to remain unresponsive and at some point in time she was noticed to be foaming around her mouth associated with bradycardia and desaturations. Blood pressure was high between 170 and 200 systolic. Critical care medicine was then consulted. Phone conversation with neurology it appears most likely differential diagnosis is uremic encephalopathy.  Chart review also shows that she has preference not to undergo dialysis in the past. However son being the power of attorney insists on dialysis.   SUBJECTIVE:  Pt. Seized this am in the ICU approx 1 min generalized convulsive seizure. Self-limited. Given  Ativan.  Low potassium yesterday. Repleted.  Continued uremic encephalopathy.    PAST MEDICAL HISTORY :  She  has a past medical history of Bipolar 1 disorder (HCC); Hypertension; Diabetes mellitus without complication (HCC); and ESRD (end stage renal disease) (HCC).  PAST SURGICAL HISTORY: She  has  past surgical history that includes Tubal ligation and AV fistula placement (Right, 06/03/2015).  Allergies  Allergen Reactions  . Iodine Other (See Comments)    unknown    No current facility-administered medications on file prior to encounter.   Current Outpatient Prescriptions on File Prior to Encounter  Medication Sig  . amLODipine (NORVASC) 5 MG tablet Take 1 tablet (5 mg total) by mouth daily. (Patient not taking: Reported on 10/04/2015)  . calcium acetate (PHOSLO) 667 MG capsule Take 3 capsules (2,001 mg total) by mouth 3 (three) times daily with meals.  . insulin NPH-regular Human (NOVOLIN 70/30) (70-30) 100 UNIT/ML injection Inject 5 Units into the skin 2 (two) times daily with a meal. (Patient not taking: Reported on 10/04/2015)  . metoprolol tartrate (LOPRESSOR) 25 MG tablet Take 1 tablet (25 mg total) by mouth 2 (two) times daily. (Patient not taking: Reported on 10/04/2015)  . multivitamin (RENA-VIT) TABS tablet Take 1 tablet by mouth at bedtime. (Patient not taking: Reported on 10/04/2015)  . Nutritional Supplements (FEEDING SUPPLEMENT, NEPRO CARB STEADY,) LIQD Take 237 mLs by mouth 2 (two) times daily between meals. (Patient not taking: Reported on 10/04/2015)  . Triprolidine-Pseudoephedrine (ANTIHISTAMINE PO) Take 1 tablet by mouth daily as needed (congestion).    FAMILY HISTORY:  Her has no family status information on file.   SOCIAL HISTORY: She  reports that she has been smoking Cigarettes.  She has been smoking about 1.00 pack per day. She does not have any smokeless tobacco history on file. She reports that she does not drink alcohol or use illicit drugs.  REVIEW OF SYSTEMS:   Per hpi   VITAL  SIGNS: BP 163/57 mmHg  Pulse 87  Temp(Src) 97.6 F (36.4 C) (Oral)  Resp 16  Ht 5\' 5"  (1.651 m)  Wt 121 lb 0.5 oz (54.9 kg)  BMI 20.14 kg/m2  SpO2 98%  HEMODYNAMICS:    VENTILATOR SETTINGS:    INTAKE / OUTPUT: I/O last 3 completed shifts: In: 681.3  [I.V.:276.3; IV Piggyback:405] Out: 2200 [Urine:50; Emesis/NG output:650; Other:1500]  PHYSICAL EXAMINATION: General:  Extremely frail cachectic female, Seizing this am, but now post-ictal.  Neuro:  Gag present intact swallow present. Open eyes briefly to vigorous name call but otherwise obtunded HEENT:  Supple neck Cardiovascular:  Regular rate and rhythm and hypertensive Lungs:  Chyene stoke breathing present Abdomen:  Soft scaphoid nontender no mass Musculoskeletal:  No cyanosis no clubbing no edema Skin:  Appears intact anteriorly and an exposed areas  LABS:  PULMONARY  Recent Labs Lab 11/13/15 2317 11/14/15 0230 11/14/15 0440  PHART 7.147* 7.251* 7.267*  PCO2ART 34.1* 36.4 37.1  PO2ART 95.7 95.6 79.6*  HCO3 11.4* 15.6* 16.6*  TCO2 12.5 16.7 17.7  O2SAT 94.4 96.0 93.7    CBC  Recent Labs Lab 11/13/15 1559 11/14/15 0024 11/14/15 0332  HGB 8.6* 9.8* 8.9*  HCT 28.7* 30.8* 27.7*  WBC 13.3* 15.3* 13.9*  PLT 291 289 269    COAGULATION  Recent Labs Lab 11/14/15 0024  INR 1.21    CARDIAC    Recent Labs Lab 11/13/15 1240 11/13/15 2140 11/14/15 0024  TROPONINI 0.37* 0.91* 1.03*   No results for input(s): PROBNP in the last 168 hours.   CHEMISTRY  Recent Labs Lab 11/13/15 1240 11/13/15 1559 11/14/15 0024 11/14/15 0332  NA 144  --  142 142  K 5.0  --  2.8* 2.4*  CL 112*  --  103 103  CO2 <7*  --  15* 16*  GLUCOSE 172*  --  130* 135*  BUN 173*  --  82* 85*  CREATININE 18.32* 18.63* 10.38* 10.25*  CALCIUM 6.5*  --  8.1* 7.8*   Estimated Creatinine Clearance: 5 mL/min (by C-G formula based on Cr of 10.25).   LIVER  Recent Labs Lab 11/13/15 1240 11/14/15 0024  AST 10* 14*  ALT 9* 12*  ALKPHOS 86 92  BILITOT 1.0 1.0  PROT 6.7 7.0  ALBUMIN 3.5 3.6  INR  --  1.21     INFECTIOUS  Recent Labs Lab 11/13/15 2049 11/14/15 0023 11/14/15 0024 11/14/15 0332  LATICACIDVEN 1.1 2.1*  --  0.8  PROCALCITON  --   --  0.39  --       ENDOCRINE CBG (last 3)   Recent Labs  11/13/15 2118 11/14/15 0355  GLUCAP 104* 129*    Ammonia leve 25/17equals 95 and elevated     IMAGING x48h  - image(s) personally visualized  -   highlighted in bold Dg Chest 2 View  11/13/2015  CLINICAL DATA:  Left-sided weakness.  Dialysis patient. EXAM: CHEST  2 VIEW COMPARISON:  10/04/2015 FINDINGS: Right jugular central venous catheter tip in the right atrium is unchanged. Negative for edema. Lungs are clear without infiltrate or effusion. Cardiac enlargement unchanged. IMPRESSION: No active cardiopulmonary disease. Electronically Signed   By: Marlan Palau M.D.   On: 11/13/2015 13:50   Ct Head Wo Contrast  11/14/2015  CLINICAL DATA:  Acute onset of altered mental status. Initial encounter. EXAM: CT HEAD WITHOUT CONTRAST TECHNIQUE: Contiguous axial images were obtained from the base of the skull through the vertex without intravenous contrast. COMPARISON:  CT  of the head performed earlier today at 1:48 p.m. FINDINGS: There is no evidence of acute infarction, mass lesion, or intra- or extra-axial hemorrhage on CT. 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 Mild periventricular white matter change likely reflects small vessel ischemic microangiopathy. The brainstem and fourth ventricle are within normal limits. The basal ganglia are unremarkable in appearance. The cerebral hemispheres demonstrate grossly normal gray-white differentiation. No mass effect or midline shift is seen. There is no evidence of fracture; visualized osseous structures are unremarkable in appearance. The orbits are within normal limits. The paranasal sinuses and mastoid air cells are well-aerated. No significant soft tissue abnormalities are seen. IMPRESSION: 1. No acute intracranial pathology seen on CT. 2. Chronic right posterior parietal infarct again noted, with associated encephalomalacia. 3. Mild small vessel ischemic  microangiopathy. Electronically Signed   By: Roanna Raider M.D.   On: 11/14/2015 00:01   Ct Head Wo Contrast  11/13/2015  CLINICAL DATA:  SIGNIFICANT LEFT SIDED WEAKNESS X 2 DAYS SLURRED SPEECH PMH: DM, HTN, Bipolar 1 disorder (HCC); ESRD EXAM: CT HEAD WITHOUT CONTRAST TECHNIQUE: Contiguous axial images were obtained from the base of the skull through the vertex without intravenous contrast. COMPARISON:  10/04/2015 FINDINGS: Remote right parietal infarct again noted no associated hemorrhage. No evidence for acute extension of the infarct. There is no intra or extra-axial fluid collection or mass lesion. The basilar cisterns and ventricles have a normal appearance. There is no CT evidence for acute infarction or hemorrhage. Bone windows show significant atherosclerotic calcification of the internal carotid arteries. No acute calvarial injury. Mild mucoperiosteal thickening of the ethmoid air cells. IMPRESSION: 1. 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 2.  No evidence for acute intracranial abnormality. 3. Mild sinusitis. Electronically Signed   By: Norva Pavlov M.D.   On: 11/13/2015 14:24   Dg Abd Portable 1v  11/14/2015  CLINICAL DATA:  Nasogastric tube placement.  Initial encounter. EXAM: PORTABLE ABDOMEN - 1 VIEW COMPARISON:  Abdominal radiograph performed 05/28/2015 FINDINGS: The patient's enteric tube is seen coiling within the body of the stomach. The visualized bowel gas pattern is unremarkable. Scattered air and stool filled loops of colon are seen; no abnormal dilatation of small bowel loops is seen to suggest small bowel obstruction. No free intra-abdominal air is identified, though evaluation for free air is limited on a single supine view. The visualized osseous structures are within normal limits; the sacroiliac joints are unremarkable in appearance. The visualized lung bases are essentially clear. Scattered calcifications are seen within the pelvis. IMPRESSION: Enteric tube seen  coiling within the body of the stomach. Electronically Signed   By: Roanna Raider M.D.   On: 11/14/2015 01:05      ASSESSMENT / PLAN:  PULMONARY A: Acute encephalopathy putting her at risk for immediate need for intubation  P:   Intubate if she does not improve, and especially if she is unable to maintain her airway.   CARDIOVASCULAR A:  Hypertensive At risk for MI P:  Cycle cardiac enzymes, continue to follow.  Goal systolic blood pressure 160-170 - cardene gtt Echo  RENAL A:    End-stage renal disease has missed dialysis 2 weeks. Not interested in dialysis. Son DURABLE POWER OF ATTORNEY insists on dialysis  P:   Per renal  - ? Dialysis today.  Goals of care needed  Daily Renal panel.   GASTROINTESTINAL A:   At risk for aspiration  P:   Nothing by mouth  PPI  OG tube - start tube feeds PEP UP  Lactulose through G-tube  HEMATOLOGIC A:   anemia of chronic disease  P:  Packed red blood cells for hemoglobin less than 7 g percent No epo per renal.    INFECTIOUS A:   SIRS + Q SOFA + No evidence of obvious infection but at high risk - Concern for Aspiration with Seizure  P:   Vancomycin 2/5 >> Zosyn 2/5 >> PCT 0.39 LA - 0.8  ENDOCRINE A:   At risk for hypo-and hyperglycemia   P:   Monitor  NEUROLOGIC A:   Severe encephalopathy with high ammonia levels  -also uremic encephalopathy  Possible seizure/breast syndrome   P:   RASS goal: 0 (but currently -3) Monitor with dialysis and treatment of infection Start lactulose through G-tube Get MRI Empiric Keppra Neurology consultation depending on above results as discussed with Dr. Roseanne Reno EEG.    FAMILY  - Updates: No family at bedside  - Inter-disciplinary family meet or Palliative Care meeting due by:  Day 7 which is 11/20/2015   Devota Pace, MD Family Medicine - PGY 2

## 2015-11-14 NOTE — Progress Notes (Signed)
Pt seized at 1737. PRN ativan administered. 1800 Keppra hung. Oxygen level dropped to 74 but after non-rebreather mask placed on patient, her oxygen level recovered. Will continue to monitor.

## 2015-11-14 NOTE — Consult Note (Addendum)
PULMONARY / CRITICAL CARE MEDICINE   Name: Jane Higgins MRN: [id number] DOB: [birth year]    ADMISSION DATE:  11/13/2015 CONSULTATION DATE:  11/14/15   REFERRING MD:  [facility] hosptalist and rapid response call  CHIEF COMPLAINT:  Acue encephalopathy  HISTORY OF PRESENT ILLNESS:   60 year old female with end-stage renal disease, diabetes, hypertension, bipolar disorder and cachexia with protein calorie malnutrition. Was brought into the hospital by her son according to chart review after she has missed dialysis for the last few weeks. Prior to admission for 24 hours she had progressive change in mental status. She was also noticed to be hypothermic with an elevated white count of 14,000 and a creatinine of 18 but without any apparent source of infection. From the emergency department she straight went to the dialysis suite where she was dialyzed off 1.5 L of fluid. According to nursing report she remained unresponsive except to name call in the dialysis unit. Upon return to stepdown unit she continued to remain unresponsive and at some point in time she was noticed to be foaming around her mouth associated with bradycardia and desaturations. Blood pressure was high between 170 and 200 systolic. Critical care medicine was then consulted. Phone conversation with neurology it appears most likely differential diagnosis is uremic encephalopathy.  Chart review also shows that she has preference not to undergo dialysis in the past. However son being the power of attorney insists on dialysis.  PAST MEDICAL HISTORY :  She  has a past medical history of Bipolar 1 disorder (HCC); Hypertension; Diabetes mellitus without complication (HCC); and ESRD (end stage renal disease) (HCC).  PAST SURGICAL HISTORY: She  has past surgical history that includes Tubal ligation and AV fistula placement (Right, 06/03/2015).  Allergies  Allergen Reactions  . Iodine Other (See Comments)    unknown    No current  facility-administered medications on file prior to encounter.   Current Outpatient Prescriptions on File Prior to Encounter  Medication Sig  . amLODipine (NORVASC) 5 MG tablet Take 1 tablet (5 mg total) by mouth daily. (Patient not taking: Reported on 10/04/2015)  . calcium acetate (PHOSLO) 667 MG capsule Take 3 capsules (2,001 mg total) by mouth 3 (three) times daily with meals.  . insulin NPH-regular Human (NOVOLIN 70/30) (70-30) 100 UNIT/ML injection Inject 5 Units into the skin 2 (two) times daily with a meal. (Patient not taking: Reported on 10/04/2015)  . metoprolol tartrate (LOPRESSOR) 25 MG tablet Take 1 tablet (25 mg total) by mouth 2 (two) times daily. (Patient not taking: Reported on 10/04/2015)  . multivitamin (RENA-VIT) TABS tablet Take 1 tablet by mouth at bedtime. (Patient not taking: Reported on 10/04/2015)  . Nutritional Supplements (FEEDING SUPPLEMENT, NEPRO CARB STEADY,) LIQD Take 237 mLs by mouth 2 (two) times daily between meals. (Patient not taking: Reported on 10/04/2015)  . Triprolidine-Pseudoephedrine (ANTIHISTAMINE PO) Take 1 tablet by mouth daily as needed (congestion).    FAMILY HISTORY:  Her has no family status information on file.   SOCIAL HISTORY: She  reports that she has been smoking Cigarettes.  She has been smoking about 1.00 pack per day. She does not have any smokeless tobacco history on file. She reports that she does not drink alcohol or use illicit drugs.  REVIEW OF SYSTEMS:   Per hpi   VITAL SIGNS: BP 185/69 mmHg  Pulse 86  Temp(Src) 97.6 F (36.4 C) (Axillary)  Resp 16  Ht 5\' 5"  (1.651 m)  Wt 52.9 kg (116 lb 10 oz)  BMI 19.41 kg/m2  SpO2 96%  HEMODYNAMICS:    VENTILATOR SETTINGS:    INTAKE / OUTPUT:    PHYSICAL EXAMINATION: General:  Extremely frail cachectic female Neuro:  Gag present intact swallow present. Open eyes briefly to vigorous name call but otherwise obtunded HEENT:  Supple neck Cardiovascular:  Regular rate and  rhythm and hypertensive Lungs:  Chyene stoke breathing present Abdomen:  Soft scaphoid nontender no mass Musculoskeletal:  No cyanosis no clubbing no edema Skin:  Appears intact anteriorly and an exposed areas  LABS:  PULMONARY  Recent Labs Lab 11/13/15 2317  PHART 7.147*  PCO2ART 34.1*  PO2ART 95.7  HCO3 11.4*  TCO2 12.5  O2SAT 94.4    CBC  Recent Labs Lab 11/13/15 1240 11/13/15 1559  HGB 9.0* 8.6*  HCT 29.4* 28.7*  WBC 14.0* 13.3*  PLT 309 291    COAGULATION No results for input(s): INR in the last 168 hours.  CARDIAC   Recent Labs Lab 11/13/15 1240 11/13/15 2140  TROPONINI 0.37* 0.91*   No results for input(s): PROBNP in the last 168 hours.   CHEMISTRY  Recent Labs Lab 11/13/15 1240 11/13/15 1559  NA 144  --   K 5.0  --   CL 112*  --   CO2 <7*  --   GLUCOSE 172*  --   BUN 173*  --   CREATININE 18.32* 18.63*  CALCIUM 6.5*  --    Estimated Creatinine Clearance: 2.6 mL/min (by C-G formula based on Cr of 18.63).   LIVER  Recent Labs Lab 11/13/15 1240  AST 10*  ALT 9*  ALKPHOS 86  BILITOT 1.0  PROT 6.7  ALBUMIN 3.5     INFECTIOUS  Recent Labs Lab 11/13/15 1310 11/13/15 2049  LATICACIDVEN 1.3 1.1     ENDOCRINE CBG (last 3)   Recent Labs  11/13/15 2118  GLUCAP 104*    Ammonia leve 25/17equals 95 and elevated     IMAGING x48h  - image(s) personally visualized  -   highlighted in bold Dg Chest 2 View  11/13/2015  CLINICAL DATA:  Left-sided weakness.  Dialysis patient. EXAM: CHEST  2 VIEW COMPARISON:  10/04/2015 FINDINGS: Right jugular central venous catheter tip in the right atrium is unchanged. Negative for edema. Lungs are clear without infiltrate or effusion. Cardiac enlargement unchanged. IMPRESSION: No active cardiopulmonary disease. Electronically Signed   By: Marlan Palau M.D.   On: 11/13/2015 13:50   Ct Head Wo Contrast  11/14/2015  CLINICAL DATA:  Acute onset of altered mental status. Initial encounter.  EXAM: CT HEAD WITHOUT CONTRAST TECHNIQUE: Contiguous axial images were obtained from the base of the skull through the vertex without intravenous contrast. COMPARISON:  CT of the head performed earlier today at 1:48 p.m. FINDINGS: There is no evidence of acute infarction, mass lesion, or intra- or extra-axial hemorrhage on CT. 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 Mild periventricular white matter change likely reflects small vessel ischemic microangiopathy. The brainstem and fourth ventricle are within normal limits. The basal ganglia are unremarkable in appearance. The cerebral hemispheres demonstrate grossly normal gray-white differentiation. No mass effect or midline shift is seen. There is no evidence of fracture; visualized osseous structures are unremarkable in appearance. The orbits are within normal limits. The paranasal sinuses and mastoid air cells are well-aerated. No significant soft tissue abnormalities are seen. IMPRESSION: 1. No acute intracranial pathology seen on CT. 2. Chronic right posterior parietal infarct again noted, with associated encephalomalacia. 3.  Mild small vessel ischemic microangiopathy. Electronically Signed   By: Roanna Raider M.D.   On: 11/14/2015 00:01   Ct Head Wo Contrast  11/13/2015  CLINICAL DATA:  SIGNIFICANT LEFT SIDED WEAKNESS X 2 DAYS SLURRED SPEECH PMH: DM, HTN, Bipolar 1 disorder (HCC); ESRD EXAM: CT HEAD WITHOUT CONTRAST TECHNIQUE: Contiguous axial images were obtained from the base of the skull through the vertex without intravenous contrast. COMPARISON:  10/04/2015 FINDINGS: Remote right parietal infarct again noted no associated hemorrhage. No evidence for acute extension of the infarct. There is no intra or extra-axial fluid collection or mass lesion. The basilar cisterns and ventricles have a normal appearance. There is no CT evidence for acute infarction or hemorrhage. Bone windows show significant  atherosclerotic calcification of the internal carotid arteries. No acute calvarial injury. Mild mucoperiosteal thickening of the ethmoid air cells. IMPRESSION: 1. 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 2.  No evidence for acute intracranial abnormality. 3. Mild sinusitis. Electronically Signed   By: Norva Pavlov M.D.   On: 11/13/2015 14:24      ASSESSMENT / PLAN:  PULMONARY A: Acute encephalopathy putting her at risk for immediate need for intubation  P:   Intubate if she does not dramatically improve  CARDIOVASCULAR A:  Hypertensive At risk for MI P:  Cycle cardiac enzymes Goal systolic blood pressure 160-170 - cardene gtt  RENAL A:   End-stage renal disease has missed dialysis 2 weeks. Not interested in dialysis. Son DURABLE POWER OF ATTORNEY insists on dialysis  P:   Per renal  -  Goals of care needed   GASTROINTESTINAL A:   At risk for aspiration  P:   Nothing by mouth  PPI  OG tube   HEMATOLOGIC A:   anemia of chronic disease  P:  Packed red blood cells for hemoglobin less than 7 g percent  INFECTIOUS A:   SIRS + Q SOFA + No evidence of obvious infection but at high risk   P:   Broad-spectrum antibiotic   ENDOCRINE A:   At risk for hypo-and hyperglycemia   P:   Monitor  NEUROLOGIC A:   Severe encephalopathy with high ammonia levels  -also uremic encephalopathy  Possible seizure/breast syndrome   P:   RASS goal: 0 (but currently -3) Monitor with dialysis and treatment of infection Start lactulose through G-tube Get MRI [facility] Neurology consultation depending on above results as discussed with Dr. Roseanne Reno  FAMILY  - Updates: No family at bedside  - Inter-disciplinary family meet or Palliative Care meeting due by:  Day 7 which is 11/20/2015    The patient is critically ill with multiple organ systems failure and requires high complexity decision making for assessment and support, frequent evaluation and  titration of therapies, application of advanced monitoring technologies and extensive interpretation of multiple databases.   Critical Care Time devoted to patient care services described in this note is  40  Minutes. This time reflects time of care of this signee Dr Kalman Shan. This critical care time does not reflect procedure time, or teaching time or supervisory time of PA/NP/Med student/Med Resident etc but could involve care discussion time    Dr. Kalman Shan, M.D., [facility].C.P Pulmonary and Critical Care Medicine Staff Physician Deputy System [place] [facility] Pager: [phone], If no answer or between  15:00h - 7:00h: call [phone]  11/14/2015 12:07 AM

## 2015-11-14 NOTE — Progress Notes (Signed)
Palliative Medicine Team consult was received.  I stopped by to check in on Jane Higgins. She is unresponsive. She was being set up for bedside EEG.  Plan is for dialysis again today.    We will schedule a meeting with patient and her family at the earliest possible time that the family can meet and we have a provider available. I called and left a message on the voicemail of her son, Jane Higgins, to set up a family meeting.  If there are urgent needs or questions please call [phone]. Thank you for consulting out team to assist with this patients care.  Romie Minus, MD [facility] Palliative Medicine Team [phone]

## 2015-11-14 NOTE — Care Management Note (Signed)
Case Management Note  Patient Details  Name: Jane Higgins MRN: [id number] Date of Birth: [birth year]  Subjective/Objective:    Pt admitted with uremic encephalopathy, pt has missed 2 weeks of HD.  Pt had emergent HD on admit               Action/Plan:  Pt is from home, son serves as support system and has informed staff that he is POA.  Pt is now unresponsive post possible seizure activity and Palliative Care has been consulted.  CM will continue to monitor for disposition needs   Expected Discharge Date:                  Expected Discharge Plan:  Home w Home Health Services  In-House Referral:     Discharge planning Services  CM Consult  Post Acute Care Choice:    Choice offered to:     DME Arranged:    DME Agency:     HH Arranged:    HH Agency:     Status of Service:  In process, will continue to follow  Medicare Important Message Given:    Date Medicare IM Given:    Medicare IM give by:    Date Additional Medicare IM Given:    Additional Medicare Important Message give by:     If discussed at Long Length of Stay Meetings, dates discussed:    Additional Comments:  Cherylann Parr, RN 11/14/2015, 2:06 PM

## 2015-11-14 NOTE — Progress Notes (Signed)
Bedside EEG completed, results pending. 

## 2015-11-15 ENCOUNTER — Inpatient Hospital Stay (HOSPITAL_COMMUNITY): Payer: Medicare Other

## 2015-11-15 ENCOUNTER — Other Ambulatory Visit (HOSPITAL_COMMUNITY): Payer: Self-pay

## 2015-11-15 DIAGNOSIS — Z515 Encounter for palliative care: Secondary | ICD-10-CM

## 2015-11-15 DIAGNOSIS — Z7189 Other specified counseling: Secondary | ICD-10-CM

## 2015-11-15 LAB — GLUCOSE, CAPILLARY
GLUCOSE-CAPILLARY: 95 mg/dL (ref 65–99)
Glucose-Capillary: 101 mg/dL — ABNORMAL HIGH (ref 65–99)
Glucose-Capillary: 111 mg/dL — ABNORMAL HIGH (ref 65–99)
Glucose-Capillary: 66 mg/dL (ref 65–99)
Glucose-Capillary: 79 mg/dL (ref 65–99)
Glucose-Capillary: 83 mg/dL (ref 65–99)
Glucose-Capillary: 84 mg/dL (ref 65–99)

## 2015-11-15 LAB — RENAL FUNCTION PANEL
ANION GAP: 17 — AB (ref 5–15)
Albumin: 2.5 g/dL — ABNORMAL LOW (ref 3.5–5.0)
BUN: 25 mg/dL — AB (ref 6–20)
CO2: 21 mmol/L — AB (ref 22–32)
CREATININE: 4.29 mg/dL — AB (ref 0.44–1.00)
Calcium: 7.2 mg/dL — ABNORMAL LOW (ref 8.9–10.3)
Chloride: 100 mmol/L — ABNORMAL LOW (ref 101–111)
GFR calc Af Amer: 12 mL/min — ABNORMAL LOW (ref >60)
GFR calc non Af Amer: 10 mL/min — ABNORMAL LOW (ref >60)
GLUCOSE: 67 mg/dL (ref 65–99)
Phosphorus: 3.9 mg/dL (ref 2.5–4.6)
Potassium: 3.4 mmol/L — ABNORMAL LOW (ref 3.5–5.1)
SODIUM: 138 mmol/L (ref 135–145)

## 2015-11-15 LAB — BLOOD GAS, ARTERIAL
ACID-BASE DEFICIT: 15.8 mmol/L — AB (ref 0.0–2.0)
Acid-base deficit: 0.7 mmol/L (ref 0.0–2.0)
BICARBONATE: 11.4 meq/L — AB (ref 20.0–24.0)
BICARBONATE: 23.4 meq/L (ref 20.0–24.0)
DRAWN BY: 365271
DRAWN BY: 441381
FIO2: 0.3
FIO2: 0.6
O2 SAT: 94.4 %
O2 SAT: 99.6 %
PATIENT TEMPERATURE: 97
PATIENT TEMPERATURE: 97.4
PCO2 ART: 34.1 mmHg — AB (ref 35.0–45.0)
PCO2 ART: 36.9 mmHg (ref 35.0–45.0)
PEEP: 5 cmH2O
PH ART: 7.414 (ref 7.350–7.450)
PO2 ART: 188 mmHg — AB (ref 80.0–100.0)
PO2 ART: 95.7 mmHg (ref 80.0–100.0)
RATE: 18 resp/min
TCO2: 12.5 mmol/L (ref 0–100)
TCO2: 24.6 mmol/L (ref 0–100)
VT: 460 mL
pH, Arterial: 7.147 — CL (ref 7.350–7.450)

## 2015-11-15 LAB — CBC
HCT: 29.5 % — ABNORMAL LOW (ref 36.0–46.0)
HEMOGLOBIN: 8.8 g/dL — AB (ref 12.0–15.0)
MCH: 22.7 pg — AB (ref 26.0–34.0)
MCHC: 29.8 g/dL — AB (ref 30.0–36.0)
MCV: 76.2 fL — ABNORMAL LOW (ref 78.0–100.0)
Platelets: 209 10*3/uL (ref 150–400)
RBC: 3.87 MIL/uL (ref 3.87–5.11)
RDW: 21.6 % — AB (ref 11.5–15.5)
WBC: 14.9 10*3/uL — ABNORMAL HIGH (ref 4.0–10.5)

## 2015-11-15 LAB — MAGNESIUM
Magnesium: 1.7 mg/dL (ref 1.7–2.4)
Magnesium: 1.7 mg/dL (ref 1.7–2.4)

## 2015-11-15 LAB — PROCALCITONIN: Procalcitonin: 3.23 ng/mL

## 2015-11-15 LAB — PHOSPHORUS
PHOSPHORUS: 2.9 mg/dL (ref 2.5–4.6)
Phosphorus: 4.3 mg/dL (ref 2.5–4.6)

## 2015-11-15 MED ORDER — DEXTROSE 50 % IV SOLN
INTRAVENOUS | Status: AC
  Filled 2015-11-15: qty 50

## 2015-11-15 MED ORDER — VANCOMYCIN HCL 500 MG IV SOLR
500.0000 mg | Freq: Once | INTRAVENOUS | Status: AC
  Administered 2015-11-15: 500 mg via INTRAVENOUS
  Filled 2015-11-15 (×2): qty 500

## 2015-11-15 MED ORDER — POTASSIUM CHLORIDE 10 MEQ/100ML IV SOLN
10.0000 meq | INTRAVENOUS | Status: AC
  Administered 2015-11-15 (×3): 10 meq via INTRAVENOUS
  Filled 2015-11-15 (×3): qty 100

## 2015-11-15 MED ORDER — DEXTROSE 50 % IV SOLN
50.0000 mL | Freq: Once | INTRAVENOUS | Status: AC
  Administered 2015-11-15: 50 mL via INTRAVENOUS

## 2015-11-15 MED ORDER — ANTISEPTIC ORAL RINSE SOLUTION (CORINZ)
7.0000 mL | Freq: Four times a day (QID) | OROMUCOSAL | Status: DC
  Administered 2015-11-15 – 2015-11-17 (×8): 7 mL via OROMUCOSAL

## 2015-11-15 MED ORDER — CHLORHEXIDINE GLUCONATE 0.12% ORAL RINSE (MEDLINE KIT)
15.0000 mL | Freq: Two times a day (BID) | OROMUCOSAL | Status: DC
  Administered 2015-11-15 – 2015-11-16 (×3): 15 mL via OROMUCOSAL

## 2015-11-15 NOTE — Progress Notes (Signed)
[facility] Progress Note   Subjective: not responding  Filed Vitals:   11/15/15 0900 11/15/15 1000 11/15/15 1100 11/15/15 1145  BP: 141/72 162/118 150/92   Pulse: 67 65 67   Temp:    97 F (36.1 C)  TempSrc:    Oral  Resp: _0 Height:      Weight:      SpO2: 100% 100% 100%     Inpatient medications: . antiseptic oral rinse  7 mL Mouth Rinse QID  . chlorhexidine gluconate  15 mL Mouth Rinse BID  . feeding supplement (NEPRO CARB STEADY)  1,000 mL Oral Q24H  . feeding supplement (PRO-STAT SUGAR FREE 64)  30 mL Per Tube Daily  . heparin  5,000 Units Subcutaneous 3 times per day  . hydrALAZINE  10 mg Intravenous Once  . insulin aspart  0-9 Units Subcutaneous 6 times per day  . lactulose  30 g Oral TID  . levETIRAcetam  250 mg Intravenous Q M,W,F-1800  . levETIRAcetam  500 mg Intravenous Q24H  . pantoprazole (PROTONIX) IV  40 mg Intravenous Q24H  . piperacillin-tazobactam (ZOSYN)  IV  2.25 g Intravenous 3 times per day  . sodium chloride flush  3 mL Intravenous Q12H   . niCARDipine Stopped (11/14/15 1005)   sodium chloride, sodium chloride, sodium chloride, sodium chloride, sodium chloride, sodium chloride, sodium chloride, acetaminophen **OR** acetaminophen, alteplase, heparin, hydrALAZINE, lidocaine (PF), lidocaine-prilocaine, LORazepam, morphine injection, ondansetron **OR** ondansetron (ZOFRAN) IV, pentafluoroprop-tetrafluoroeth, senna-docusate, sodium chloride flush  Exam: Obtunded, unresponsive +JVD Chest coarse bs bilat RRR no rmg Abd soft nd no ascites Ext 1+ edema bilat R IJ cath clear exit site, RUA AVF +bruit   CXR neg x 3, no edema/ infiltrates  D[facility]: [facility]  4h  2/3.5 bath  400/800  Heparin none  R IJ cath (RUA AVF 8/26) MIrcera 225 q 2wks Hect 8 ug tiw      Assessment: 1 ESRD w missed HD two weeks- similar presentation in December 2 AMS post-ictal +/- uremia 3 HTN  4 Met acidosis 5 MBD 6 Bipolar 7 DM2 8 EOL - seen  by [facility] team, is now DNR   Plan - HD again today, then TTS   Kelly Splinter MD [place] pager [phone]    cell [phone] 11/15/2015, 12:03 PM    Recent Labs Lab 11/14/15 1224  11/14/15 2003 11/15/15 0249 11/15/15 1052  NA 143  --  140 138  --   K 3.1*  --  3.4* 3.4*  --   CL 102  --  98* 100*  --   CO2 17*  --  21* 21*  --   GLUCOSE 150*  --  105* 67  --   BUN 89*  --  95* 25*  --   CREATININE 10.87*  --  11.30* 4.29*  --   CALCIUM 7.5*  --  7.1* 7.2*  --   PHOS 10.4*  < > 11.9* 3.9 4.3  < > = values in this interval not displayed.  Recent Labs Lab 11/13/15 1240 11/14/15 0024 11/14/15 1224 11/14/15 2003 11/15/15 0249  AST 10* 14*  --   --   --   ALT 9* 12*  --   --   --   ALKPHOS 86 92  --   --   --   BILITOT 1.0 1.0  --   --   --   PROT 6.7 7.0  --   --   --  ALBUMIN 3.5 3.6 3.2* 3.2* 2.5*    Recent Labs Lab 11/13/15 1240  11/14/15 0332 11/14/15 2002 11/15/15 0249  WBC 14.0*  < > 13.9* 15.6* 14.9*  NEUTROABS 12.7*  --   --   --   --   HGB 9.0*  < > 8.9* 8.8* 8.8*  HCT 29.4*  < > 27.7* 29.2* 29.5*  MCV 77.6*  < > 73.5* 76.0* 76.2*  PLT 309  < > 269 332 209  < > = values in this interval not displayed.

## 2015-11-15 NOTE — Progress Notes (Signed)
Palliative Care RN note: Met with 9 family members (including son and 2 daughters) with Dr Domingo Cocking; please see his consult note. Plan f/u meeting on Thursday 2/9 at 0830.   Contact info: Son Lynnae Sandhoff [phone] Daughter Cathi Roan [phone] Daughter Heriberto Antigua [phone]  Larina Earthly, RN, BSN, [facility] 11/15/2015 10:07 AM

## 2015-11-15 NOTE — Progress Notes (Signed)
PULMONARY / CRITICAL CARE MEDICINE   Name: MARKELLE ASARO MRN: [id number] DOB: 1956/05/15    ADMISSION DATE:  11/13/2015 CONSULTATION DATE:  11/14/15   REFERRING MD:  [facility] hosptalist and rapid response call  CHIEF COMPLAINT:  Acue encephalopathy  HISTORY OF PRESENT ILLNESS:   60 year old female with end-stage renal disease, diabetes, hypertension, bipolar disorder and cachexia with protein calorie malnutrition. Was brought into the hospital by her son according to chart review after she has missezd dialysis for the last few weeks. Prior to admission for 24 hours she had progressive change in mental status. She was also noticed to be hypothermic with an elevated white count of 14,000 and a creatinine of 18 but without any apparent source of infection. From the emergency department she straight went to the dialysis suite where she was dialyzed off 1.5 L of fluid. According to nursing report she remained unresponsive except to name call in the dialysis unit. Upon return to stepdown unit she continued to remain unresponsive and at some point in time she was noticed to be foaming around her mouth associated with bradycardia and desaturations. Blood pressure was high between 956 and 213 systolic. Critical care medicine was then consulted. Phone conversation with neurology it appears most likely differential diagnosis is uremic encephalopathy.  Chart review also shows that she has preference not to undergo dialysis in the past. However son being the power of attorney insists on dialysis.   SUBJECTIVE:  Pt. Seizing since yesterday afternoon intermittently.  Obtunded and requiring intubation for airway protection.  Received HD yesterday, seized during HD.  Hypoglycemic overnight requiring administration of D50 Hypokalemic this am  Palliative care initiating process of family meeting regarding goals of care.    PAST MEDICAL HISTORY :  She  has a past medical history of Bipolar 1 disorder ([place]);  Hypertension; Diabetes mellitus without complication ([place]); and ESRD (end stage renal disease) ([place]).  PAST SURGICAL HISTORY: She  has past surgical history that includes Tubal ligation and AV fistula placement (Right, 06/03/2015).  Allergies  Allergen Reactions  . Iodine Other (See Comments)    unknown    No current facility-administered medications on file prior to encounter.   Current Outpatient Prescriptions on File Prior to Encounter  Medication Sig  . amLODipine (NORVASC) 5 MG tablet Take 1 tablet (5 mg total) by mouth daily. (Patient not taking: Reported on 10/04/2015)  . calcium acetate (PHOSLO) 667 MG capsule Take 3 capsules (2,001 mg total) by mouth 3 (three) times daily with meals.  . insulin NPH-regular Human (NOVOLIN 70/30) (70-30) 100 UNIT/ML injection Inject 5 Units into the skin 2 (two) times daily with a meal. (Patient not taking: Reported on 10/04/2015)  . metoprolol tartrate (LOPRESSOR) 25 MG tablet Take 1 tablet (25 mg total) by mouth 2 (two) times daily. (Patient not taking: Reported on 10/04/2015)  . multivitamin (RENA-VIT) TABS tablet Take 1 tablet by mouth at bedtime. (Patient not taking: Reported on 10/04/2015)  . Nutritional Supplements (FEEDING SUPPLEMENT, NEPRO CARB STEADY,) LIQD Take 237 mLs by mouth 2 (two) times daily between meals. (Patient not taking: Reported on 10/04/2015)  . Triprolidine-Pseudoephedrine (ANTIHISTAMINE PO) Take 1 tablet by mouth daily as needed (congestion).    FAMILY HISTORY:  Her has no family status information on file.   SOCIAL HISTORY: She  reports that she has been smoking Cigarettes.  She has been smoking about 1.00 pack per day. She does not have any smokeless tobacco history on file. She reports that she does not  drink alcohol or use illicit drugs.  REVIEW OF SYSTEMS:   Per hpi   VITAL SIGNS: BP 142/75 mmHg  Pulse 65  Temp(Src) 97.6 F (36.4 C) (Oral)  Resp 18  Ht '5\' 5"'$  (1.651 m)  Wt 117 lb 11.6 oz (53.4 kg)  BMI  19.59 kg/m2  SpO2 100%  HEMODYNAMICS:    VENTILATOR SETTINGS: Vent Mode:  [-] PRVC FiO2 (%):  [40 %-60 %] 40 % Set Rate:  [18 bmp] 18 bmp Vt Set:  [460 mL] 460 mL PEEP:  [5 cmH20] 5 cmH20 Plateau Pressure:  [19 cmH20-20 cmH20] 20 cmH20  INTAKE / OUTPUT: I/O last 3 completed shifts: In: 1790.5 [I.V.:718; Other:110; IV Piggyback:962.5] Out: 2598 [Urine:98; Emesis/NG output:1000; Other:1500]  PHYSICAL EXAMINATION: General:  Extremely frail cachectic female, intubated Neuro:  Obtunded  HEENT:  Supple neck Cardiovascular:  Regular rate and rhythm and normotensive Lungs:  Appropriate rate, coarse breath sounds bilaterally.  Abdomen:  Soft scaphoid nontender no mass Musculoskeletal:  No cyanosis no clubbing no edema Skin:  Appears intact anteriorly and an exposed areas  LABS:  PULMONARY  Recent Labs Lab 11/13/15 2317 11/14/15 0230 11/14/15 0440 11/14/15 2350  PHART 7.147* 7.251* 7.267* 7.414  PCO2ART 34.1* 36.4 37.1 36.9  PO2ART 95.7 95.6 79.6* 188*  HCO3 11.4* 15.6* 16.6* 23.4  TCO2 12.5 16.7 17.7 24.6  O2SAT 94.4 96.0 93.7 99.6    CBC  Recent Labs Lab 11/14/15 0332 11/14/15 2002 11/15/15 0249  HGB 8.9* 8.8* 8.8*  HCT 27.7* 29.2* 29.5*  WBC 13.9* 15.6* 14.9*  PLT 269 332 209    COAGULATION  Recent Labs Lab 11/14/15 0024  INR 1.21    CARDIAC    Recent Labs Lab 11/13/15 2140 11/14/15 0024 11/14/15 0908 11/14/15 1032 11/14/15 1828  TROPONINI 0.91* 1.03* 0.89* 0.85* 0.64*   No results for input(s): PROBNP in the last 168 hours.   CHEMISTRY  Recent Labs Lab 11/14/15 0024 11/14/15 0332  11/14/15 0908 11/14/15 1032 11/14/15 1224 11/14/15 1915 11/14/15 2003 11/15/15 0249  NA 142 142  --   --   --  143  --  140 138  K 2.8* 2.4*  --   --   --  3.1*  --  3.4* 3.4*  CL 103 103  --   --   --  102  --  98* 100*  CO2 15* 16*  --   --   --  17*  --  21* 21*  GLUCOSE 130* 135*  --   --   --  150*  --  105* 67  BUN 82* 85*  --   --   --  89*   --  95* 25*  CREATININE 10.38* 10.25*  --   --   --  10.87*  --  11.30* 4.29*  CALCIUM 8.1* 7.8*  --   --   --  7.5*  --  7.1* 7.2*  MG  --   --   --  1.8 1.8  --  1.9  --   --   PHOS  --   --   < > 9.8* 10.3* 10.4* 11.9* 11.9* 3.9  < > = values in this interval not displayed. Estimated Creatinine Clearance: 11.6 mL/min (by C-G formula based on Cr of 4.29).   LIVER  Recent Labs Lab 11/13/15 1240 11/14/15 0024 11/14/15 1224 11/14/15 2003 11/15/15 0249  AST 10* 14*  --   --   --   ALT 9* 12*  --   --   --  ALKPHOS 86 92  --   --   --   BILITOT 1.0 1.0  --   --   --   PROT 6.7 7.0  --   --   --   ALBUMIN 3.5 3.6 3.2* 3.2* 2.5*  INR  --  1.21  --   --   --      INFECTIOUS  Recent Labs Lab 11/13/15 2049 11/14/15 0023 11/14/15 0024 11/14/15 0332 11/14/15 0908 11/15/15 0249  LATICACIDVEN 1.1 2.1*  --  0.8  --   --   PROCALCITON  --   --  0.39  --  0.58 3.23     ENDOCRINE CBG (last 3)   Recent Labs  11/14/15 2342 11/15/15 0351 11/15/15 0425  GLUCAP 79 66 111*    Ammonia leve 25/17equals 95 and elevated     IMAGING x48h  - image(s) personally visualized  -   highlighted in bold Dg Chest 2 View  11/13/2015  CLINICAL DATA:  Left-sided weakness.  Dialysis patient. EXAM: CHEST  2 VIEW COMPARISON:  10/04/2015 FINDINGS: Right jugular central venous catheter tip in the right atrium is unchanged. Negative for edema. Lungs are clear without infiltrate or effusion. Cardiac enlargement unchanged. IMPRESSION: No active cardiopulmonary disease. Electronically Signed   By: Franchot Gallo M.D.   On: 11/13/2015 13:50   Ct Head Wo Contrast  11/14/2015  CLINICAL DATA:  Acute onset of altered mental status. Initial encounter. EXAM: CT HEAD WITHOUT CONTRAST TECHNIQUE: Contiguous axial images were obtained from the base of the skull through the vertex without intravenous contrast. COMPARISON:  CT of the head performed earlier today at 1:48 p.m. FINDINGS: There is no evidence of  acute infarction, mass lesion, or intra- or extra-axial hemorrhage on CT. 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 Mild periventricular white matter change likely reflects small vessel ischemic microangiopathy. The brainstem and fourth ventricle are within normal limits. The basal ganglia are unremarkable in appearance. The cerebral hemispheres demonstrate grossly normal gray-white differentiation. No mass effect or midline shift is seen. There is no evidence of fracture; visualized osseous structures are unremarkable in appearance. The orbits are within normal limits. The paranasal sinuses and mastoid air cells are well-aerated. No significant soft tissue abnormalities are seen. IMPRESSION: 1. No acute intracranial pathology seen on CT. 2. Chronic right posterior parietal infarct again noted, with associated encephalomalacia. 3. Mild small vessel ischemic microangiopathy. Electronically Signed   By: Garald Balding M.D.   On: 11/14/2015 00:01   Ct Head Wo Contrast  11/13/2015  CLINICAL DATA:  SIGNIFICANT LEFT SIDED WEAKNESS X 2 DAYS SLURRED SPEECH PMH: DM, HTN, Bipolar 1 disorder ([place]); ESRD EXAM: CT HEAD WITHOUT CONTRAST TECHNIQUE: Contiguous axial images were obtained from the base of the skull through the vertex without intravenous contrast. COMPARISON:  10/04/2015 FINDINGS: Remote right parietal infarct again noted no associated hemorrhage. No evidence for acute extension of the infarct. There is no intra or extra-axial fluid collection or mass lesion. The basilar cisterns and ventricles have a normal appearance. There is no CT evidence for acute infarction or hemorrhage. Bone windows show significant atherosclerotic calcification of the internal carotid arteries. No acute calvarial injury. Mild mucoperiosteal thickening of the ethmoid air cells. IMPRESSION: 1. 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 2.  No evidence for acute intracranial  abnormality. 3. Mild sinusitis. Electronically Signed   By: Nolon Nations M.D.   On: 11/13/2015 14:24   Mr Brain Wo Contrast  11/14/2015  CLINICAL DATA:  Progressively worsening mental status of the last few days. Patient with renal failure, diabetes and hypertension. EXAM: MRI HEAD WITHOUT CONTRAST TECHNIQUE: Multiplanar, multiecho pulse sequences of the brain and surrounding structures were obtained without intravenous contrast. COMPARISON:  CT 11/13/2015.  MRI 04/14/2013. FINDINGS: Diffusion imaging does not show any acute or subacute infarction. There are old small vessel infarctions within the left pons. No focal cerebellar abnormality. There is an old cortical and subcortical infarction in the right parietal lobe with atrophy, encephalomalacia and adjacent gliosis. No other sign of stroke. Focus of susceptibility artifact in the right temporal lobe is again seen, unchanged since 2014, and likely relating to a developmental venous anomaly such as a cavernoma or venous angioma. No evidence of mass lesion, acute hemorrhage, hydrocephalus or extra-axial collection. No pituitary mass. No inflammatory sinus disease. No skull or skullbase lesion. IMPRESSION: No acute or reversible finding. 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 Old small vessel infarctions left pons. Chronic focus of susceptibility artifact in the right temporal lobe likely to represent either a cavernoma or venous angioma. Electronically Signed   By: Nelson Chimes M.D.   On: 11/14/2015 16:21   Dg Chest Port 1 View  11/14/2015  CLINICAL DATA:  Intubation.  Hypertension. EXAM: PORTABLE CHEST 1 VIEW COMPARISON:  11/14/2015 earlier today at 1031 hours. FINDINGS: Dual lumen dialysis catheter unchanged in position. Endotracheal tube is been inserted and lies 7.1 cm above the carina. Lung fields remain clear. Stable cardiomegaly. No pneumothorax or pleural effusion. IMPRESSION: ET tube lies 7.1 cm above carina. If desired, the tube  could be advanced 2-3 cm. No active cardiopulmonary disease. Electronically Signed   By: Staci Righter M.D.   On: 11/14/2015 23:10   Dg Chest Port 1 View  11/14/2015  CLINICAL DATA:  Aspiration into airway. EXAM: PORTABLE CHEST 1 VIEW COMPARISON:  November 13, 2015. FINDINGS: Stable cardiomegaly. No pneumothorax or pleural effusion is noted. No acute pulmonary disease is noted. Nasogastric tube tip is seen in proximal stomach. Right internal jugular dialysis catheter is unchanged in position. Bony thorax is unremarkable. IMPRESSION: No acute cardiopulmonary abnormality seen. Electronically Signed   By: Marijo Conception, M.D.   On: 11/14/2015 11:39   Dg Abd Portable 1v  11/14/2015  CLINICAL DATA:  NG placement EXAM: PORTABLE ABDOMEN - 1 VIEW COMPARISON:  11/13/2014 FINDINGS: NG coiled in the stomach with the tip near the fundus. Normal bowel gas pattern.  Calcified uterine fibroid on the left. IMPRESSION: NG tube coiled in the stomach.  Negative for bowel obstruction. Electronically Signed   By: Franchot Gallo M.D.   On: 11/14/2015 13:06   Dg Abd Portable 1v  11/14/2015  CLINICAL DATA:  Nasogastric tube placement.  Initial encounter. EXAM: PORTABLE ABDOMEN - 1 VIEW COMPARISON:  Abdominal radiograph performed 05/28/2015 FINDINGS: The patient's enteric tube is seen coiling within the body of the stomach. The visualized bowel gas pattern is unremarkable. Scattered air and stool filled loops of colon are seen; no abnormal dilatation of small bowel loops is seen to suggest small bowel obstruction. No free intra-abdominal air is identified, though evaluation for free air is limited on a single supine view. The visualized osseous structures are within normal limits; the sacroiliac joints are unremarkable in appearance. The visualized lung bases are essentially clear. Scattered calcifications are seen within the pelvis. IMPRESSION: Enteric tube seen coiling within the body of the stomach. Electronically Signed   By:  Garald Balding M.D.   On: 11/14/2015  01:05      ASSESSMENT / PLAN:  PULMONARY A: Acute encephalopathy putting her at risk for immediate need for intubation Intubated - Full Vent Support   P:   Intubated VAP prevention Daily CXR Wean Vent as able.  Pending GOC discussion  CARDIOVASCULAR A:  Hypertensive At risk for MI Troponemia > ESRD, Hypertensive, No evidence of Acute MI.   P: Goal systolic blood pressure [phone] - cardene gtt prn.  Echo pending  RENAL A:    End-stage renal disease has missed dialysis 2 weeks. Not interested in dialysis. Son DURABLE POWER OF ATTORNEY insists on dialysis  P:   Per renal  - dialysis yesterday.  Goals of care needed  Daily Renal panel.   GASTROINTESTINAL A:   At risk for aspiration  P:   Nothing by mouth  PPI for Stress Ulcer PPX OG tube - start tube feeds PEP UP  Lactulose through G-tube  HEMATOLOGIC A:   anemia of chronic disease  P:  Packed red blood cells for hemoglobin less than 7 g percent No epo per renal.    INFECTIOUS A:   SIRS + Q SOFA + No evidence of obvious infection but at high risk - Concern for Aspiration with Seizure  P:   Vancomycin 2/5 >> Zosyn 2/5 >> PCT 0.39 > 3.23. Consider Antibiotic Addition / Escalation LA - 0.8  ENDOCRINE A:   At risk for hypo-and hyperglycemia   P:   Monitor   NEUROLOGIC A:   Severe encephalopathy with high ammonia levels  -also uremic encephalopathy  Possible seizure - EEG nonspecific for Epileptiform activity.   P:   RASS goal: -4 goal met.  Monitor with dialysis and treatment of infection  Start lactulose through G-tube Get MRI > old right parietal, cortical and subcortical infarcts. Empiric Keppra  Neurology consultation > appreciate recommendations.  Palliative care consulted.    FAMILY  - Updates: No family at bedside > Palliative Care initiating discussion.   - Inter-disciplinary family meet or Palliative Care meeting due by:  Day 7 which is  11/20/2015   Paula Compton, MD Family Medicine - PGY 2

## 2015-11-15 NOTE — Progress Notes (Signed)
Notified Resident MD Doroteo Glassman in regards to patient having CBG of 66, gave 1 amp of D50, rechecked CBG and now 111. Will continue to monitor and assess.

## 2015-11-15 NOTE — Consult Note (Signed)
Consultation Note Date: 11/15/2015   Patient Name: Jane Higgins  DOB: 10/20/1955  MRN: [id number]  Age / Sex: 59 y.o., female  PCP: No primary care provider on file. Referring Physician: Murali Ramaswamy, MD  Reason for Consultation: Establishing goals of care  Clinical Assessment/Narrative: 59-year-old female with end-stage renal disease, diabetes, hypertension, bipolar disorder and cachexia brought into the hospital by her son after missing dialysis for the last 2 weeks. From the emergency department she straight went to the dialysis suite where she was dialyzed off 1.5 L of fluid.  According to nursing report she remained unresponsive except to name call in the dialysis unit. Upon return to stepdown unit she continued to remain unresponsive and at some point in time she was noticed to be foaming around her mouth associated with bradycardia and desaturations. She has been having seizure activity regularly since that time and was subsequently intubated last evening.  Palliative consulted for goals of care.  Met with the patient's family including her son and 2 daughters, 2 brothers, her sister, her sister-in-law, and several nieces and nephews.  They report the most important things to the patient are being at home and spending time with family. She enjoys family cookouts and decorating her home.  She has been living with her son and has been on dialysis since for end-stage renal disease since August of this year. They report this is the third occasion where she has stopped going to dialysis. 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  We discussed that in light of multiple  chronic medical problems that have worsened with this acute problem, care should be focused on interventions that are likely to allow the patient to achieve goal of being well enough to participate in goals of care conversation. I discussed with family regarding heroic interventions at the end-of-life and they agree this would not be in line with prior expressed wishes for a natural death or be likely to lead to her recovering to a quality of life she would find acceptable. They were in agreement with changing CODE STATUS to DO NOT RESUSCITATE, but otherwise continue with current therapies until we meet on Thursday at 0830.  Contacts/Participants in Discussion: Multiple family members Primary Decision Maker: Her children  HCPOA: none on chart. Reported to be her son Jane Higgins.  SUMMARY OF RECOMMENDATIONS - Her family is hopeful that with continued treatment she will be able to wake up enough to participate in conversation regarding her goals moving forward.  - If her condition deteriorates, no further escalation of care moving forward. I changed her CODE STATUS to DO NOT RESUSCITATE. No plans to de-escalate care at this time, only no escalation of care if she continues to worsen or suffers cardiac arrest. - I spoke with family at length about pathways moving forward and they would like to continue with a time-limited trial of current therapies to see if she awakens enough to participate in conversation. - We set up a meeting for Thursday (48 hours) at 8:30 AM re-discuss.  I let them know my recommendation would be to consider focusing on comfort moving forward if she is not improved at that point as there is a high possibility this is not a reversible process.    Code Status/Advance Care   Planning: DNR    Code Status Orders        Start     Ordered   11/15/15 1011  Do not attempt resuscitation (DNR)   Continuous    Question Answer Comment  In the event of cardiac or respiratory ARREST Do not call  a "code blue"   In the event of cardiac or respiratory ARREST Do not perform Intubation, CPR, defibrillation or ACLS   In the event of cardiac or respiratory ARREST Use medication by any route, position, wound care, and other measures to relive pain and suffering. May use oxygen, suction and manual treatment of airway obstruction as needed for comfort.      11/15/15 1010    Code Status History    Date Active Date Inactive Code Status Order ID Comments User Context   11/13/2015  3:07 PM 11/15/2015 10:10 AM Full Code [id number]  Radene Gunning, NP ED   10/05/2015 12:05 AM 10/09/2015 10:00 PM Full Code [id number]  Cristal Ford, DO Inpatient   05/28/2015  9:10 PM 06/05/2015  9:17 PM Full Code [id number]  Sid Falcon, MD Inpatient      Symptom Management: Patient unresponsive. Appears comfortable. Continue management per primary.  Palliative Prophylaxis:   Bowel Regimen and Frequent Pain Assessment  Additional Recommendations (Limitations, Scope, Preferences):  Continue current therapy for another 48 hours at which time we'll meet again with the family. No escalation of care if she decompensates or suffers cardiac arrest    Psycho-social/Spiritual:  Support System: Strong Desire for further Chaplaincy support: Did not address today  Prognosis: Remains critically ill. If focus changed to comfort care only, her prognosis would be a matter of days at best.  Discharge Planning: To be determined   Chief Complaint/ Primary Diagnoses: Present on Admission:  . Uremic encephalopathy . SIRS (systemic inflammatory response syndrome) (HCC) . Hyperglycemia . Schizophrenia ([place]) . Anemia . ESRD needing dialysis ([place])  I have reviewed the medical record, interviewed the patient and family, and examined the patient. The following aspects are pertinent.  Past Medical History  Diagnosis Date  . Bipolar 1 disorder ([place])   . Hypertension   . Diabetes mellitus without complication ([place])   . ESRD  (end stage renal disease) ([place])    Social History   Social History  . Marital Status: Widowed    Spouse Name: N/A  . Number of Children: N/A  . Years of Education: N/A   Social History Main Topics  . Smoking status: Current Every Day Smoker -- 1.00 packs/day    Types: Cigarettes  . Smokeless tobacco: None  . Alcohol Use: No     Comment: no  . Drug Use: No  . Sexual Activity: Not Asked   Other Topics Concern  . None   Social History Narrative   Family History  Problem Relation Age of Onset  . Colon cancer      Neg history  . Kidney disease Cousin    Scheduled Meds: . antiseptic oral rinse  7 mL Mouth Rinse QID  . chlorhexidine gluconate  15 mL Mouth Rinse BID  . feeding supplement (NEPRO CARB STEADY)  1,000 mL Oral Q24H  . feeding supplement (PRO-STAT SUGAR FREE 64)  30 mL Per Tube Daily  . heparin  5,000 Units Subcutaneous 3 times per day  . hydrALAZINE  10 mg Intravenous Once  . insulin aspart  0-9 Units Subcutaneous 6 times per day  . lactulose  30 g Oral TID  . levETIRAcetam  250 mg Intravenous Q M,W,F-1800  . levETIRAcetam  500 mg Intravenous Q24H  . pantoprazole (PROTONIX) IV  40 mg Intravenous Q24H  . piperacillin-tazobactam (ZOSYN)  IV  2.25 g Intravenous 3 times per day  . potassium chloride  10 mEq Intravenous Q1 Hr x 3  . sodium chloride flush  3 mL Intravenous Q12H   Continuous Infusions: . niCARDipine Stopped (11/14/15 1005)   PRN Meds:.sodium chloride, sodium chloride, sodium chloride, sodium chloride, sodium chloride, sodium chloride, sodium chloride, acetaminophen **OR** acetaminophen, alteplase, heparin, hydrALAZINE, lidocaine (PF), lidocaine-prilocaine, LORazepam, morphine injection, ondansetron **OR** ondansetron (ZOFRAN) IV, pentafluoroprop-tetrafluoroeth, senna-docusate, sodium chloride flush Medications Prior to Admission:  Prior to Admission medications   Medication Sig Start Date End Date Taking? Authorizing Provider  amLODipine (NORVASC) 5  MG tablet Take 1 tablet (5 mg total) by mouth daily. Patient not taking: Reported on 10/04/2015 04/16/13   Marianne L York, PA-C  calcium acetate (PHOSLO) 667 MG capsule Take 3 capsules (2,001 mg total) by mouth 3 (three) times daily with meals. 10/09/15   David Tat, MD  insulin NPH-regular Human (NOVOLIN 70/30) (70-30) 100 UNIT/ML injection Inject 5 Units into the skin 2 (two) times daily with a meal. Patient not taking: Reported on 10/04/2015 06/05/15   Nayana Abrol, MD  metoprolol tartrate (LOPRESSOR) 25 MG tablet Take 1 tablet (25 mg total) by mouth 2 (two) times daily. Patient not taking: Reported on 10/04/2015 04/16/13   Marianne L York, PA-C  multivitamin (RENA-VIT) TABS tablet Take 1 tablet by mouth at bedtime. Patient not taking: Reported on 10/04/2015 06/05/15   Nayana Abrol, MD  Nutritional Supplements (FEEDING SUPPLEMENT, NEPRO CARB STEADY,) LIQD Take 237 mLs by mouth 2 (two) times daily between meals. Patient not taking: Reported on 10/04/2015 06/05/15   Nayana Abrol, MD  Triprolidine-Pseudoephedrine (ANTIHISTAMINE PO) Take 1 tablet by mouth daily as needed (congestion).    Historical Provider, MD   Allergies  Allergen Reactions  . Iodine Other (See Comments)    unknown    Review of Systems  Unable to obtain  Physical Exam  General: Extremely frail cachectic female, intubated Neuro: Obtunded  HEENT: Supple neck Cardiovascular: Regular rate and rhythm and normotensive Lungs: Appropriate rate, coarse breath sounds bilaterally.  Abdomen: Soft scaphoid nontender no mass Musculoskeletal: No cyanosis no clubbing no edema  Vital Signs: BP 162/118 mmHg  Pulse 65  Temp(Src) 97 F (36.1 C) (Oral)  Resp 16  Ht 5' 5" (1.651 m)  Wt 53.4 kg (117 lb 11.6 oz)  BMI 19.59 kg/m2  SpO2 100%  SpO2: SpO2: 100 % O2 Device:SpO2: 100 % O2 Flow Rate: .O2 Flow Rate (L/min): 3 L/min  IO: Intake/output summary:  Intake/Output Summary (Last 24 hours) at 11/15/15 1026 Last data filed  at 11/15/15 1000  Gross per 24 hour  Intake 964.17 ml  Output     25 ml  Net 939.17 ml    LBM: Last BM Date: 11/13/15 Baseline Weight: Weight: 54 kg (119 lb 0.8 oz) Most recent weight: Weight: 53.4 kg (117 lb 11.6 oz)      Palliative Assessment/Data:  Flowsheet Rows        Most Recent Value   Intake Tab    Referral Department  Critical care   Unit at Time of Referral  ICU   Palliative Care Primary Diagnosis  Nephrology   Date Notified  11/13/15   Reason for referral  Clarify Goals of Care   Date of Admission  11/13/15   Date first seen by Palliative   Care  11/14/15   # of days Palliative referral response time  1 Day(s)   # of days IP prior to Palliative referral  0   Clinical Assessment    Palliative Performance Scale Score  10%   Pain Max last 24 hours  Not able to report   Pain Min Last 24 hours  Not able to report   Psychosocial & Spiritual Assessment    Palliative Care Outcomes    Patient/Family meeting held?  Yes   Who was at the meeting?  patients children, siblings   Palliative Care Outcomes  Clarified goals of care, Changed CPR status   Patient/Family wishes: Interventions discontinued/not started   Vasopressors      Additional Data Reviewed:  CBC:    Component Value Date/Time   WBC 14.9* 11/15/2015 0249   HGB 8.8* 11/15/2015 0249   HCT 29.5* 11/15/2015 0249   PLT 209 11/15/2015 0249   MCV 76.2* 11/15/2015 0249   NEUTROABS 12.7* 11/13/2015 1240   LYMPHSABS 0.7 11/13/2015 1240   MONOABS 0.6 11/13/2015 1240   EOSABS 0.0 11/13/2015 1240   BASOSABS 0.0 11/13/2015 1240   Comprehensive Metabolic Panel:    Component Value Date/Time   NA 138 11/15/2015 0249   K 3.4* 11/15/2015 0249   CL 100* 11/15/2015 0249   CO2 21* 11/15/2015 0249   BUN 25* 11/15/2015 0249   CREATININE 4.29* 11/15/2015 0249   CREATININE 1.02 06/28/2007 1510   GLUCOSE 67 11/15/2015 0249   CALCIUM 7.2* 11/15/2015 0249   AST 14* 11/14/2015 0024   ALT 12* 11/14/2015 0024   ALKPHOS 92  11/14/2015 0024   BILITOT 1.0 11/14/2015 0024   PROT 7.0 11/14/2015 0024   ALBUMIN 2.5* 11/15/2015 0249     Time In: 0840  Time Out: 1005 Time Total: 85 Greater than 50%  of this time was spent counseling and coordinating care related to the above assessment and plan.  Signed by:  , MD   , MD  11/15/2015, 10:26 AM  Please contact Palliative Medicine Team phone at [phone] for questions and concerns.            

## 2015-11-16 ENCOUNTER — Inpatient Hospital Stay (HOSPITAL_COMMUNITY): Payer: Medicare Other

## 2015-11-16 LAB — POCT I-STAT 3, ART BLOOD GAS (G3+)
ACID-BASE EXCESS: 3 mmol/L — AB (ref 0.0–2.0)
BICARBONATE: 26.8 meq/L — AB (ref 20.0–24.0)
O2 SAT: 99 %
PO2 ART: 118 mmHg — AB (ref 80.0–100.0)
TCO2: 28 mmol/L (ref 0–100)
pCO2 arterial: 36.7 mmHg (ref 35.0–45.0)
pH, Arterial: 7.471 — ABNORMAL HIGH (ref 7.350–7.450)

## 2015-11-16 LAB — RENAL FUNCTION PANEL
Albumin: 2.7 g/dL — ABNORMAL LOW (ref 3.5–5.0)
Anion gap: 15 (ref 5–15)
BUN: 27 mg/dL — AB (ref 6–20)
CALCIUM: 7.7 mg/dL — AB (ref 8.9–10.3)
CHLORIDE: 103 mmol/L (ref 101–111)
CO2: 24 mmol/L (ref 22–32)
CREATININE: 4.32 mg/dL — AB (ref 0.44–1.00)
GFR calc non Af Amer: 10 mL/min — ABNORMAL LOW (ref >60)
GFR, EST AFRICAN AMERICAN: 12 mL/min — AB (ref >60)
Glucose, Bld: 120 mg/dL — ABNORMAL HIGH (ref 65–99)
Phosphorus: 2.9 mg/dL (ref 2.5–4.6)
Potassium: 2.9 mmol/L — ABNORMAL LOW (ref 3.5–5.1)
SODIUM: 142 mmol/L (ref 135–145)

## 2015-11-16 LAB — CBC
HCT: 27.5 % — ABNORMAL LOW (ref 36.0–46.0)
Hemoglobin: 8.2 g/dL — ABNORMAL LOW (ref 12.0–15.0)
MCH: 22.8 pg — AB (ref 26.0–34.0)
MCHC: 29.8 g/dL — ABNORMAL LOW (ref 30.0–36.0)
MCV: 76.4 fL — ABNORMAL LOW (ref 78.0–100.0)
PLATELETS: 235 10*3/uL (ref 150–400)
RBC: 3.6 MIL/uL — AB (ref 3.87–5.11)
RDW: 21.6 % — AB (ref 11.5–15.5)
WBC: 13.3 10*3/uL — AB (ref 4.0–10.5)

## 2015-11-16 LAB — GLUCOSE, CAPILLARY
GLUCOSE-CAPILLARY: 136 mg/dL — AB (ref 65–99)
GLUCOSE-CAPILLARY: 79 mg/dL (ref 65–99)
Glucose-Capillary: 111 mg/dL — ABNORMAL HIGH (ref 65–99)
Glucose-Capillary: 107 mg/dL — ABNORMAL HIGH (ref 65–99)
Glucose-Capillary: 77 mg/dL (ref 65–99)

## 2015-11-16 LAB — PROCALCITONIN: Procalcitonin: 4.34 ng/mL

## 2015-11-16 MED ORDER — SODIUM CHLORIDE 0.9 % IV SOLN
1.5000 g | Freq: Two times a day (BID) | INTRAVENOUS | Status: DC
  Filled 2015-11-16 (×2): qty 1.5

## 2015-11-16 MED ORDER — POTASSIUM CHLORIDE 20 MEQ/15ML (10%) PO SOLN
40.0000 meq | Freq: Once | ORAL | Status: AC
  Administered 2015-11-16: 40 meq via ORAL
  Filled 2015-11-16: qty 30

## 2015-11-16 MED ORDER — POTASSIUM CHLORIDE 10 MEQ/100ML IV SOLN
10.0000 meq | INTRAVENOUS | Status: AC
  Administered 2015-11-16 (×4): 10 meq via INTRAVENOUS
  Filled 2015-11-16 (×3): qty 100

## 2015-11-16 MED ORDER — POTASSIUM CHLORIDE 10 MEQ/100ML IV SOLN
10.0000 meq | INTRAVENOUS | Status: DC
  Administered 2015-11-16: 10 meq via INTRAVENOUS
  Filled 2015-11-16 (×2): qty 100

## 2015-11-16 MED ORDER — SODIUM CHLORIDE 0.9 % IV SOLN
1.5000 g | Freq: Two times a day (BID) | INTRAVENOUS | Status: AC
  Administered 2015-11-16 – 2015-11-17 (×3): 1.5 g via INTRAVENOUS
  Filled 2015-11-16 (×4): qty 1.5

## 2015-11-16 NOTE — Progress Notes (Signed)
SLP Cancellation Note  Patient Details Name: Jane Higgins MRN: [id number] DOB: January 09, 1956   Cancelled treatment:       Reason Eval/Treat Not Completed: Patient not medically ready. Pt self extubated today after two day intubation. She is sleeping now, RN hoped we could see her later if she became more alert, though she probably will not. SLP communicated we will be able to evaluate the pt tomorrow am and to continue use of NG for meds if possible.    [name], Riley Nearing 11/16/2015, 10:39 AM

## 2015-11-16 NOTE — Progress Notes (Addendum)
Patient refusing to be turned, cursing and swatting at staff.  Patient refusing assessment, refusing mouth care and medication administration.

## 2015-11-16 NOTE — Progress Notes (Signed)
Pt self-extubated. Pt able to speak. Vitals are stable with SATs of 100% on 3L [place]. Will continue to monitor.

## 2015-11-16 NOTE — Progress Notes (Signed)
Since self-extubating around 0815 today patient has been refusing medical care. Pt has refused blood draws and also blood sugars. Patient also refusing to allow staff to clean up stool. Patient states "I'm not dirty." Patient informed that her bed and gown were covered in stool. Patients stated "Well I will lay in poop." Nursing staff attempted to change sheets but patient refused to turn and began swatting at staff.

## 2015-11-16 NOTE — Progress Notes (Signed)
Pharmacy Antibiotic Note  Jane Higgins is a 60 y.o. female admitted on 11/13/2015 with suspected aspiration pneumonia.  Pharmacy has been consulted for Unasyn dosing.  Plan: D/c vanc and zosyn  Start Unasyn 1.5 gm IV q12h Stop date in for a total of 7 days of abx F/u cbc, cx, renal fx, clinical course  Height:  (165.1 cm) Weight: 115 lb 15.4 oz (52.6 kg) IBW/kg (Calculated) : 57  Temp (24hrs), Avg:98.2 F (36.8 C), Min:97 F (36.1 C), Max:99.2 F (37.3 C)   Recent Labs Lab 11/13/15 1310  11/13/15 2049 11/14/15 0023 11/14/15 0024 11/14/15 0332 11/14/15 1224 11/14/15 2002 11/14/15 2003 11/15/15 0249 11/16/15 0250  WBC  --   < >  --   --  15.3* 13.9*  --  15.6*  --  14.9* 13.3*  CREATININE  --   < >  --   --  10.38* 10.25* 10.87*  --  11.30* 4.29* 4.32*  LATICACIDVEN 1.3  --  1.1 2.1*  --  0.8  --   --   --   --   --   < > = values in this interval not displayed.  Estimated Creatinine Clearance: 11.4 mL/min (by C-G formula based on Cr of 4.32).    Allergies  Allergen Reactions  . Iodine Other (See Comments)    unknown    Antimicrobials this admission: Zosyn 2/5 >> 2/8 Vanc 2/5 >> 2/8 Unasyn 2/8 >> (2/11) - stop date in place  Microbiology results: 2/5 BCx: ngtd 2/6 BCx: ngtd 2/5 MRSA PCR neg  Thank you for allowing pharmacy to be a part of this patient's care.  Brindley Madarang L. Roseanne Reno, PharmD PGY2 Infectious Diseases Pharmacy Resident Pager: [phone] 11/16/2015 11:17 AM

## 2015-11-16 NOTE — Progress Notes (Signed)
Pt continuing to refuse medical treatment. Pt has refused lab draw x's 2. And refused to let Echo tech perform echocardiogram. Attending MD to be notified.

## 2015-11-16 NOTE — Care Management Note (Signed)
Case Management Note  Patient Details  Name: Jane Higgins MRN: [id number] Date of Birth: 04/29/1956  Subjective/Objective:    Pt admitted with uremic encephalopathy, pt has missed 2 weeks of HD.  Pt had emergent HD on admit               Action/Plan:   11/16/2015 Pt self extubated and is now on [place].  [name] at bedside and provided following information:  Pt is from home with adult son Jane Higgins, daughters are Jane Higgins and Jane Higgins.  Attending will meet with family tomorrow 2/9 hopefully pt can participate in goals of care discussion.  CM will continue to montior  11/14/15 Pt is from home, son serves as support system and has informed staff that he is POA.  Pt is now unresponsive post possible seizure activity and Palliative Care has been consulted.  CM will continue to monitor for disposition needs   Expected Discharge Date:                  Expected Discharge Plan:  Home w Home Health Services  In-House Referral:     Discharge planning Services  CM Consult  Post Acute Care Choice:    Choice offered to:     DME Arranged:    DME Agency:     HH Arranged:    HH Agency:     Status of Service:  In process, will continue to follow  Medicare Important Message Given:    Date Medicare IM Given:    Medicare IM give by:    Date Additional Medicare IM Given:    Additional Medicare Important Message give by:     If discussed at Long Length of Stay Meetings, dates discussed:    Additional Comments:  Cherylann Parr, RN 11/16/2015, 10:07 AM

## 2015-11-16 NOTE — Progress Notes (Signed)
PULMONARY / CRITICAL CARE MEDICINE   Name: Jane Higgins MRN: [id number] DOB: Aug 14, 1956    ADMISSION DATE:  11/13/2015 CONSULTATION DATE:  11/14/15   REFERRING MD:  [facility] hosptalist and rapid response call  CHIEF COMPLAINT:  Acue encephalopathy  HISTORY OF PRESENT ILLNESS:   60 year old female with end-stage renal disease, diabetes, hypertension, bipolar disorder and cachexia with protein calorie malnutrition. Was brought into the hospital by her son according to chart review after she has missezd dialysis for the last few weeks. Prior to admission for 24 hours she had progressive change in mental status. She was also noticed to be hypothermic with an elevated white count of 14,000 and a creatinine of 18 but without any apparent source of infection. From the emergency department she straight went to the dialysis suite where she was dialyzed off 1.5 L of fluid. According to nursing report she remained unresponsive except to name call in the dialysis unit. Upon return to stepdown unit she continued to remain unresponsive and at some point in time she was noticed to be foaming around her mouth associated with bradycardia and desaturations. Blood pressure was high between 147 and 829 systolic. Critical care medicine was then consulted. Phone conversation with neurology it appears most likely differential diagnosis is uremic encephalopathy.  Chart review also shows that she has preference not to undergo dialysis in the past. However son being the power of attorney insists on dialysis.   SUBJECTIVE:  Pt. Without seizure while intubated yesterday.  Remains intubated. Responding to questioning / commands today.  Received HD yesterday, tolerated well.   Hypokalemic this am  DNR / No further escalation of care decided by family in palliative meeting yesterday. Plan to meet again on 2/9.     PAST MEDICAL HISTORY :  She  has a past medical history of Bipolar 1 disorder ([place]); Hypertension; Diabetes  mellitus without complication ([place]); and ESRD (end stage renal disease) ([place]).  PAST SURGICAL HISTORY: She  has past surgical history that includes Tubal ligation and AV fistula placement (Right, 06/03/2015).  Allergies  Allergen Reactions  . Iodine Other (See Comments)    unknown    No current facility-administered medications on file prior to encounter.   Current Outpatient Prescriptions on File Prior to Encounter  Medication Sig  . amLODipine (NORVASC) 5 MG tablet Take 1 tablet (5 mg total) by mouth daily. (Patient not taking: Reported on 10/04/2015)  . calcium acetate (PHOSLO) 667 MG capsule Take 3 capsules (2,001 mg total) by mouth 3 (three) times daily with meals. (Patient not taking: Reported on 11/15/2015)  . insulin NPH-regular Human (NOVOLIN 70/30) (70-30) 100 UNIT/ML injection Inject 5 Units into the skin 2 (two) times daily with a meal. (Patient not taking: Reported on 10/04/2015)  . metoprolol tartrate (LOPRESSOR) 25 MG tablet Take 1 tablet (25 mg total) by mouth 2 (two) times daily. (Patient not taking: Reported on 10/04/2015)  . multivitamin (RENA-VIT) TABS tablet Take 1 tablet by mouth at bedtime. (Patient not taking: Reported on 10/04/2015)  . Nutritional Supplements (FEEDING SUPPLEMENT, NEPRO CARB STEADY,) LIQD Take 237 mLs by mouth 2 (two) times daily between meals. (Patient not taking: Reported on 10/04/2015)    FAMILY HISTORY:  Her has no family status information on file.   SOCIAL HISTORY: She  reports that she has been smoking Cigarettes.  She has been smoking about 1.00 pack per day. She does not have any smokeless tobacco history on file. She reports that she does not drink alcohol or  use illicit drugs.   VITAL SIGNS: BP 138/66 mmHg  Pulse 57  Temp(Src) 99.2 F (37.3 C) (Oral)  Resp 18  Ht '5\' 5"'$  (1.651 m)  Wt 115 lb 15.4 oz (52.6 kg)  BMI 19.30 kg/m2  SpO2 100%  HEMODYNAMICS:    VENTILATOR SETTINGS: Vent Mode:  [-] PRVC FiO2 (%):  [40 %] 40 % Set  Rate:  [18 bmp] 18 bmp Vt Set:  [460 mL] 460 mL PEEP:  [5 cmH20] 5 cmH20 Plateau Pressure:  [17 cmH20-23 cmH20] 21 cmH20  INTAKE / OUTPUT: I/O last 3 completed shifts: In: 1925.4 [I.V.:681.7; Other:110; NG/GT:126.3; IV Piggyback:1007.5] Out: 1423 [Urine:73; Emesis/NG output:350; Other:1000]  PHYSICAL EXAMINATION: General:  Extremely frail cachectic female, intubated Neuro:  Obtunded  HEENT:  Supple neck, NGT / ETT in place.  Cardiovascular:  Regular rate and rhythm and normotensive Lungs:  Appropriate rate, coarse breath sounds bilaterally.  Abdomen:  Soft scaphoid nontender no mass Musculoskeletal:  No cyanosis no clubbing no edema Skin:  Appears intact anteriorly and an exposed areas  LABS:  PULMONARY  Recent Labs Lab 11/13/15 2317 11/14/15 0230 11/14/15 0440 11/14/15 2350 11/16/15 0515  PHART 7.147* 7.251* 7.267* 7.414 7.471*  PCO2ART 34.1* 36.4 37.1 36.9 36.7  PO2ART 95.7 95.6 79.6* 188* 118.0*  HCO3 11.4* 15.6* 16.6* 23.4 26.8*  TCO2 12.5 16.7 17.7 24.6 28  O2SAT 94.4 96.0 93.7 99.6 99.0    CBC  Recent Labs Lab 11/14/15 2002 11/15/15 0249 11/16/15 0250  HGB 8.8* 8.8* 8.2*  HCT 29.2* 29.5* 27.5*  WBC 15.6* 14.9* 13.3*  PLT 332 209 235    COAGULATION  Recent Labs Lab 11/14/15 0024  INR 1.21    CARDIAC    Recent Labs Lab 11/13/15 2140 11/14/15 0024 11/14/15 0908 11/14/15 1032 11/14/15 1828  TROPONINI 0.91* 1.03* 0.89* 0.85* 0.64*   No results for input(s): PROBNP in the last 168 hours.   CHEMISTRY  Recent Labs Lab 11/14/15 0332  11/14/15 0908 11/14/15 1032 11/14/15 1224 11/14/15 1915 11/14/15 2003 11/15/15 0249 11/15/15 1052 11/15/15 2130 11/16/15 0250  NA 142  --   --   --  143  --  140 138  --   --  142  K 2.4*  --   --   --  3.1*  --  3.4* 3.4*  --   --  2.9*  CL 103  --   --   --  102  --  98* 100*  --   --  103  CO2 16*  --   --   --  17*  --  21* 21*  --   --  24  GLUCOSE 135*  --   --   --  150*  --  105* 67  --    --  120*  BUN 85*  --   --   --  89*  --  95* 25*  --   --  27*  CREATININE 10.25*  --   --   --  10.87*  --  11.30* 4.29*  --   --  4.32*  CALCIUM 7.8*  --   --   --  7.5*  --  7.1* 7.2*  --   --  7.7*  MG  --   --  1.8 1.8  --  1.9  --   --  1.7 1.7  --   PHOS  --   < > 9.8* 10.3* 10.4* 11.9* 11.9* 3.9 4.3 2.9 2.9  < > =  values in this interval not displayed. Estimated Creatinine Clearance: 11.4 mL/min (by C-G formula based on Cr of 4.32).   LIVER  Recent Labs Lab 11/13/15 1240 11/14/15 0024 11/14/15 1224 11/14/15 2003 11/15/15 0249 11/16/15 0250  AST 10* 14*  --   --   --   --   ALT 9* 12*  --   --   --   --   ALKPHOS 86 92  --   --   --   --   BILITOT 1.0 1.0  --   --   --   --   PROT 6.7 7.0  --   --   --   --   ALBUMIN 3.5 3.6 3.2* 3.2* 2.5* 2.7*  INR  --  1.21  --   --   --   --      INFECTIOUS  Recent Labs Lab 11/13/15 2049 11/14/15 0023  11/14/15 0332 11/14/15 0908 11/15/15 0249 11/16/15 0250  LATICACIDVEN 1.1 2.1*  --  0.8  --   --   --   PROCALCITON  --   --   < >  --  0.58 3.23 4.34  < > = values in this interval not displayed.   ENDOCRINE CBG (last 3)   Recent Labs  11/15/15 1144 11/15/15 1731 11/15/15 2002  GLUCAP 101* 83 84    Ammonia level 2/5/17equals 95 and elevated     IMAGING x48h  - image(s) personally visualized  -   highlighted in bold Mr Brain Wo Contrast  11/14/2015  CLINICAL DATA:  Progressively worsening mental status of the last few days. Patient with renal failure, diabetes and hypertension. EXAM: MRI HEAD WITHOUT CONTRAST TECHNIQUE: Multiplanar, multiecho pulse sequences of the brain and surrounding structures were obtained without intravenous contrast. COMPARISON:  CT 11/13/2015.  MRI 04/14/2013. FINDINGS: Diffusion imaging does not show any acute or subacute infarction. There are old small vessel infarctions within the left pons. No focal cerebellar abnormality. There is an old cortical and subcortical infarction in the  right parietal lobe with atrophy, encephalomalacia and adjacent gliosis. No other sign of stroke. Focus of susceptibility artifact in the right temporal lobe is again seen, unchanged since 2014, and likely relating to a developmental venous anomaly such as a cavernoma or venous angioma. No evidence of mass lesion, acute hemorrhage, hydrocephalus or extra-axial collection. No pituitary mass. No inflammatory sinus disease. No skull or skullbase lesion. IMPRESSION: No acute or reversible finding. 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 Old small vessel infarctions left pons. Chronic focus of susceptibility artifact in the right temporal lobe likely to represent either a cavernoma or venous angioma. Electronically Signed   By: Nelson Chimes M.D.   On: 11/14/2015 16:21   Dg Chest Port 1 View  11/14/2015  CLINICAL DATA:  Intubation.  Hypertension. EXAM: PORTABLE CHEST 1 VIEW COMPARISON:  11/14/2015 earlier today at 1031 hours. FINDINGS: Dual lumen dialysis catheter unchanged in position. Endotracheal tube is been inserted and lies 7.1 cm above the carina. Lung fields remain clear. Stable cardiomegaly. No pneumothorax or pleural effusion. IMPRESSION: ET tube lies 7.1 cm above carina. If desired, the tube could be advanced 2-3 cm. No active cardiopulmonary disease. Electronically Signed   By: Staci Righter M.D.   On: 11/14/2015 23:10   Dg Chest Port 1 View  11/14/2015  CLINICAL DATA:  Aspiration into airway. EXAM: PORTABLE CHEST 1 VIEW COMPARISON:  November 13, 2015. FINDINGS: Stable cardiomegaly. No pneumothorax or pleural effusion is noted. No  acute pulmonary disease is noted. Nasogastric tube tip is seen in proximal stomach. Right internal jugular dialysis catheter is unchanged in position. Bony thorax is unremarkable. IMPRESSION: No acute cardiopulmonary abnormality seen. Electronically Signed   By: Marijo Conception, M.D.   On: 11/14/2015 11:39   Dg Abd Portable 1v  11/15/2015  CLINICAL DATA:  Status  post NG placement EXAM: PORTABLE ABDOMEN - 1 VIEW COMPARISON:  11/14/2015 FINDINGS: A nasogastric catheter is noted coiled within the stomach. The tip is directed towards the gastric fundus. Scattered large and small bowel gas is noted. Calcifications consistent with chronic pancreatitis are noted. No free air is seen. IMPRESSION: Nasogastric catheter within the stomach. Electronically Signed   By: Inez Catalina M.D.   On: 11/15/2015 11:55   Dg Abd Portable 1v  11/14/2015  CLINICAL DATA:  NG placement EXAM: PORTABLE ABDOMEN - 1 VIEW COMPARISON:  11/13/2014 FINDINGS: NG coiled in the stomach with the tip near the fundus. Normal bowel gas pattern.  Calcified uterine fibroid on the left. IMPRESSION: NG tube coiled in the stomach.  Negative for bowel obstruction. Electronically Signed   By: Franchot Gallo M.D.   On: 11/14/2015 13:06      ASSESSMENT / PLAN:  PULMONARY A: Acute encephalopathy putting her at risk for immediate need for intubation Intubated - Full Vent Support   P:   Intubated VAP prevention Intermittent CXR Wean Vent as able.  No escalation at this time. [place] discussion again tomorrow.  Abx for aspiration pneumonia.   CARDIOVASCULAR A:  Hypertensive At risk for MI Troponemia > ESRD, Hypertensive, No evidence of Acute MI.   P: Goal systolic blood pressure [phone] - cardene gtt prn. severe Hypertension.  Echo pending  RENAL A:    End-stage renal disease has missed dialysis 2 weeks. Not interested in dialysis. Son DURABLE POWER OF ATTORNEY insists on dialysis  GOC > DNR Hypokalemia - intermittent.  P:   Per renal  - dialysis yesterday Schedule is now TTS Goals of care needed  Daily Renal panel.  Replete electrolytes as indicated.   GASTROINTESTINAL A:   At risk for aspiration  P:   Nothing by mouth  PPI for Stress Ulcer PPX OG tube - tube feeds as tolerated.  Lactulose through G-tube  HEMATOLOGIC A:   anemia of chronic disease  P:  Packed red blood cells  for hemoglobin less than 7 g percent No epo per renal.    INFECTIOUS A:   SIRS + Q SOFA + No evidence of obvious infection but at high risk - Concern for Aspiration with Seizure  P:   Vancomycin 2/5 >> Zosyn 2/5 >> PCT 0.39 > 3.23. ESRD. Clinically stable.   ENDOCRINE A:   At risk for hypo-and hyperglycemia   P:    Monitor   NEUROLOGIC A:   Severe encephalopathy with high ammonia levels  -also uremic encephalopathy  Possible seizure - EEG nonspecific for Epileptiform activity.   P:   RASS goal: -4 goal met.  Monitor with dialysis and treatment of infection  Start lactulose through G-tube Get MRI > old right parietal, cortical and subcortical infarcts. Empiric Keppra  Neurology consultation > appreciate recommendations.  Palliative care consulted appreciate recs.    FAMILY  - Updates: Palliative care discussion on 2/7 leading to DNR status and decision not to escalate care. Family to make further decisions on 2/9 regarding withdrawal of care.   - Inter-disciplinary family meet or Palliative Care meeting due by:  Day 7 which  is 11/20/2015   Paula Compton, MD Family Medicine - PGY 2

## 2015-11-16 NOTE — Progress Notes (Signed)
eLink Physician-Brief Progress Note Patient Name: TAVIANA WESTERGREN DOB: 1956/02/17 MRN: [id number]   Date of Service  11/16/2015  HPI/Events of Note  Camera check on patient post extubation. Resting comfortably in bed in no respiratory distress.  eICU Interventions  Continue current plan of care.     Intervention Category Intermediate Interventions: Other:  Lawanda Cousins 11/16/2015, 9:54 PM

## 2015-11-16 NOTE — Progress Notes (Signed)
In to assess patient and noted NG tube further out than previously noted. Checked placement via auscultation and pt began coughing. Attempted to advance NG tube and patient began to swat at nurse and say "leave it alone." Questioned patient if she had been pulling at NG and patient reports "a little bit." Patient more awake refusing to let nurse adjust NG, RN removed NG due to incorrect placement.   Patient asking to eat. Informed that she is NPO and speech with eval tomorrow AM. Mouth swabs offered to patient and patient declined.

## 2015-11-16 NOTE — Progress Notes (Signed)
[facility] Progress Note   Subjective: extubated , responsive  Filed Vitals:   11/16/15 0801 11/16/15 0818 11/16/15 0900 11/16/15 1000  BP:  152/93 123/78 142/69  Pulse:  75 67 66  Temp: 98.2 F (36.8 C)     TempSrc: Oral     Resp:  Height:      Weight:      SpO2:  100% 100% 100%    Inpatient medications: . ampicillin-sulbactam (UNASYN) IV  1.5 g Intravenous Q12H  . antiseptic oral rinse  7 mL Mouth Rinse QID  . chlorhexidine gluconate  15 mL Mouth Rinse BID  . feeding supplement (NEPRO CARB STEADY)  1,000 mL Oral Q24H  . feeding supplement (PRO-STAT SUGAR FREE 64)  30 mL Per Tube Daily  . heparin  5,000 Units Subcutaneous 3 times per day  . hydrALAZINE  10 mg Intravenous Once  . insulin aspart  0-9 Units Subcutaneous 6 times per day  . lactulose  30 g Oral TID  . levETIRAcetam  250 mg Intravenous Q M,W,F-1800  . levETIRAcetam  500 mg Intravenous Q24H  . pantoprazole (PROTONIX) IV  40 mg Intravenous Q24H  . sodium chloride flush  3 mL Intravenous Q12H     sodium chloride, sodium chloride, sodium chloride, sodium chloride, sodium chloride, sodium chloride, sodium chloride, acetaminophen **OR** acetaminophen, alteplase, heparin, hydrALAZINE, lidocaine (PF), lidocaine-prilocaine, LORazepam, morphine injection, ondansetron **OR** ondansetron (ZOFRAN) IV, pentafluoroprop-tetrafluoroeth, senna-docusate, sodium chloride flush  Exam: Alert, irritable No jvd Chest coarse bs bilat RRR no rmg Abd soft nd no ascites Ext no edema R IJ cath clear exit site, RUA AVF +bruit   CXR neg x 3, no edema/ infiltrates  D[facility]  4h  2/3.5 bath  400/800  52kg  Heparin none  R IJ cath (RUA AVF 8/26) MIrcera 225 q 2wks Hect 8 ug tiw      Assessment: 1 AMS - uremia from missed HD x 2wks, resolving 2 ESRD TTS HD 3 HTN home bp meds on hold 4 HD access - AVF used on Jan 12 w 2 needles, placed Aug '16 5 MBD 6 Bipolar d/o 7 DM2 8 EOL - seen by [facility]  team, is now DNR.  It appears from recent events and the notes that patient does not want to do dialysis anymore. Patient is becoming more alert but still confused.  Pall care team working w family regarding this issue.    Plan - HD tomorrow   Vinson Moselle MD [facility] pager [phone]    cell [phone] 11/16/2015, 1:07 PM    Recent Labs Lab 11/14/15 2003 11/15/15 0249 11/15/15 1052 11/15/15 2130 11/16/15 0250  NA 140 138  --   --  142  K 3.4* 3.4*  --   --  2.9*  CL 98* 100*  --   --  103  CO2 21* 21*  --   --  24  GLUCOSE 105* 67  --   --  120*  BUN 95* 25*  --   --  27*  CREATININE 11.30* 4.29*  --   --  4.32*  CALCIUM 7.1* 7.2*  --   --  7.7*  PHOS 11.9* 3.9 4.3 2.9 2.9    Recent Labs Lab 11/13/15 1240 11/14/15 0024  11/14/15 2003 11/15/15 0249 11/16/15 0250  AST 10* 14*  --   --   --   --   ALT 9* 12*  --   --   --   --  ALKPHOS 86 92  --   --   --   --   BILITOT 1.0 1.0  --   --   --   --   PROT 6.7 7.0  --   --   --   --   ALBUMIN 3.5 3.6  < > 3.2* 2.5* 2.7*  < > = values in this interval not displayed.  Recent Labs Lab 11/13/15 1240  11/14/15 2002 11/15/15 0249 11/16/15 0250  WBC 14.0*  < > 15.6* 14.9* 13.3*  NEUTROABS 12.7*  --   --   --   --   HGB 9.0*  < > 8.8* 8.8* 8.2*  HCT 29.4*  < > 29.2* 29.5* 27.5*  MCV 77.6*  < > 76.0* 76.2* 76.4*  PLT 309  < > 332 209 235  < > = values in this interval not displayed.

## 2015-11-17 ENCOUNTER — Inpatient Hospital Stay (HOSPITAL_COMMUNITY): Payer: Medicare Other

## 2015-11-17 DIAGNOSIS — R531 Weakness: Secondary | ICD-10-CM

## 2015-11-17 DIAGNOSIS — Z66 Do not resuscitate: Secondary | ICD-10-CM

## 2015-11-17 LAB — GLUCOSE, CAPILLARY
GLUCOSE-CAPILLARY: 87 mg/dL (ref 65–99)
GLUCOSE-CAPILLARY: 137 mg/dL — AB (ref 65–99)
Glucose-Capillary: 66 mg/dL (ref 65–99)
Glucose-Capillary: 150 mg/dL — ABNORMAL HIGH (ref 65–99)
Glucose-Capillary: 112 mg/dL — ABNORMAL HIGH (ref 65–99)
Glucose-Capillary: 75 mg/dL (ref 65–99)

## 2015-11-17 LAB — RENAL FUNCTION PANEL
ALBUMIN: 2.6 g/dL — AB (ref 3.5–5.0)
Anion gap: 12 (ref 5–15)
BUN: 38 mg/dL — AB (ref 6–20)
CALCIUM: 7.8 mg/dL — AB (ref 8.9–10.3)
CO2: 23 mmol/L (ref 22–32)
CREATININE: 5.87 mg/dL — AB (ref 0.44–1.00)
Chloride: 106 mmol/L (ref 101–111)
GFR calc Af Amer: 8 mL/min — ABNORMAL LOW (ref >60)
GFR, EST NON AFRICAN AMERICAN: 7 mL/min — AB (ref >60)
Glucose, Bld: 143 mg/dL — ABNORMAL HIGH (ref 65–99)
PHOSPHORUS: 3.7 mg/dL (ref 2.5–4.6)
Potassium: 3.5 mmol/L (ref 3.5–5.1)
SODIUM: 141 mmol/L (ref 135–145)

## 2015-11-17 LAB — CBC
HCT: 27.6 % — ABNORMAL LOW (ref 36.0–46.0)
Hemoglobin: 8.1 g/dL — ABNORMAL LOW (ref 12.0–15.0)
MCH: 23.3 pg — ABNORMAL LOW (ref 26.0–34.0)
MCHC: 29.3 g/dL — ABNORMAL LOW (ref 30.0–36.0)
MCV: 79.3 fL (ref 78.0–100.0)
PLATELETS: 190 10*3/uL (ref 150–400)
RBC: 3.48 MIL/uL — ABNORMAL LOW (ref 3.87–5.11)
RDW: 22.1 % — AB (ref 11.5–15.5)
WBC: 13.8 10*3/uL — AB (ref 4.0–10.5)

## 2015-11-17 MED ORDER — DARBEPOETIN ALFA 200 MCG/0.4ML IJ SOSY
200.0000 ug | PREFILLED_SYRINGE | INTRAMUSCULAR | Status: DC
  Administered 2015-11-17: 200 ug via INTRAVENOUS

## 2015-11-17 MED ORDER — SODIUM CHLORIDE 0.9 % IV SOLN: 100.0000 mL | INTRAVENOUS | Status: DC | PRN

## 2015-11-17 MED ORDER — DOXERCALCIFEROL 4 MCG/2ML IV SOLN
INTRAVENOUS | Status: AC
  Filled 2015-11-17: qty 2

## 2015-11-17 MED ORDER — ALTEPLASE 2 MG IJ SOLR
2.0000 mg | Freq: Once | INTRAMUSCULAR | Status: DC | PRN
  Filled 2015-11-17: qty 2

## 2015-11-17 MED ORDER — CALCIUM ACETATE (PHOS BINDER) 667 MG PO CAPS
2001.0000 mg | ORAL_CAPSULE | Freq: Three times a day (TID) | ORAL | Status: DC
  Administered 2015-11-17 – 2015-11-21 (×12): 2001 mg via ORAL
  Filled 2015-11-17 (×14): qty 3

## 2015-11-17 MED ORDER — DARBEPOETIN ALFA 200 MCG/0.4ML IJ SOSY
PREFILLED_SYRINGE | INTRAMUSCULAR | Status: AC
  Filled 2015-11-17: qty 0.4

## 2015-11-17 MED ORDER — LIDOCAINE HCL (PF) 1 % IJ SOLN: 5.0000 mL | INTRAMUSCULAR | Status: DC | PRN

## 2015-11-17 MED ORDER — DEXTROSE 50 % IV SOLN
1.0000 | Freq: Once | INTRAVENOUS | Status: AC
Start: 2015-11-17 — End: 2015-11-17
  Administered 2015-11-17: 50 mL via INTRAVENOUS
  Filled 2015-11-17: qty 50

## 2015-11-17 MED ORDER — HEPARIN SODIUM (PORCINE) 1000 UNIT/ML DIALYSIS: 1000.0000 [IU] | INTRAMUSCULAR | Status: DC | PRN

## 2015-11-17 MED ORDER — DOXERCALCIFEROL 4 MCG/2ML IV SOLN
8.0000 ug | INTRAVENOUS | Status: DC
  Administered 2015-11-17 – 2015-11-19 (×2): 8 ug via INTRAVENOUS
  Filled 2015-11-17: qty 4

## 2015-11-17 MED ORDER — LIDOCAINE-PRILOCAINE 2.5-2.5 % EX CREA: 1.0000 "application " | TOPICAL_CREAM | CUTANEOUS | Status: DC | PRN

## 2015-11-17 MED ORDER — PENTAFLUOROPROP-TETRAFLUOROETH EX AERO: 1.0000 "application " | INHALATION_SPRAY | CUTANEOUS | Status: DC | PRN

## 2015-11-17 NOTE — Progress Notes (Signed)
Report provided to nurse for [phone]. CNA to transport patient shortly.

## 2015-11-17 NOTE — Evaluation (Signed)
Clinical/Bedside Swallow Evaluation Patient Details  Name: Jane Higgins MRN: [id number] Date of Birth: 06-08-1956  Today's Date: 11/17/2015 Time: SLP Start Time (ACUTE ONLY): 1415 SLP Stop Time (ACUTE ONLY): 1428 SLP Time Calculation (min) (ACUTE ONLY): 13 min  Past Medical History:  Past Medical History  Diagnosis Date  . Bipolar 1 disorder (H[facility])   . Hypertension   . Diabetes mellitus without complication ([facility])   . ESRD (end stage renal disease) [facility])    Past Surgical History:  Past Surgical History  Procedure Laterality Date  . Tubal ligation    . Av fistula placement Right 06/03/2015    Procedure: ARTERIOVENOUS (AV) FISTULA CREATION ;  Surgeon: Larina Earthly, MD;  Location[facility] OR;  Service: Vascular;  Laterality: Right;   HPI:  60 year old female with end-stage renal disease, diabetes, hypertension, bipolar disorder and cachexia with protein calorie malnutrition. Pt brought to ED by family for AMS after missing several weeks of dialysis. Pt started having seizure activity 2/6 requiring intubation. Pt self-extubated on 2/8.   Assessment / Plan / Recommendation Clinical Impression  Pt's vocal quality is mildly soft but relatively clear s/p self-extubation. She had one delayed throat clear during initial presentation of ice chips, with no further overt signs of aspiration noted throughout trials. Pt was cognitively unable to complete the 3 ounce water test, but did take several large, consecutive sips during attempts. Mastication is prolonged, likely due to many missing teeth. Recommend to start Dys 2 diet and thin liquids with full supervision for use of aspiration precautions. Will f/u for tolerance and possible advancement.    Aspiration Risk  Mild aspiration risk;Moderate aspiration risk    Diet Recommendation Dysphagia 2 (Fine chop);Thin liquid   Liquid Administration via: Cup;Straw Medication Administration: Whole meds with puree Supervision: Patient able to self feed;Full  supervision/cueing for compensatory strategies Compensations: Minimize environmental distractions;Slow rate;Small sips/bites Postural Changes: Seated upright at 90 degrees    Other  Recommendations Oral Care Recommendations: Oral care BID   Follow up Recommendations   (tba)    Frequency and Duration min 2x/week  2 weeks       Prognosis Prognosis for Safe Diet Advancement: Good      Swallow Study   General HPI: 60 year old female with end-stage renal disease, diabetes, hypertension, bipolar disorder and cachexia with protein calorie malnutrition. Pt brought to ED by family for AMS after missing several weeks of dialysis. Pt started having seizure activity 2/6 requiring intubation. Pt self-extubated on 2/8. Type of Study: Bedside Swallow Evaluation Previous Swallow Assessment: none in chart Diet Prior to this Study: NPO Temperature Spikes Noted: No Respiratory Status: Nasal cannula History of Recent Intubation: Yes Length of Intubations (days): 2 days Date extubated: 11/16/15 (self-extubated) Behavior/Cognition: Alert;Cooperative;Pleasant mood;Requires cueing Oral Cavity Assessment: Within Functional Limits Oral Care Completed by SLP: No Oral Cavity - Dentition: Missing dentition;Dentures, not available Vision: Functional for self-feeding Self-Feeding Abilities: Able to feed self Patient Positioning: Upright in bed Baseline Vocal Quality: Other (comment) (? mildly soft) Volitional Cough: Weak Volitional Swallow: Able to elicit    Oral/Motor/Sensory Function Overall Oral Motor/Sensory Function: Within functional limits   Ice Chips Ice chips: Impaired Presentation: Spoon Pharyngeal Phase Impairments: Throat Clearing - Delayed (x1 )   Thin Liquid Thin Liquid: Within functional limits Presentation: Cup;Self Fed;Straw    Nectar Thick Nectar Thick Liquid: Not tested   Honey Thick Honey Thick Liquid: Not tested   Puree Puree: Within functional limits Presentation: Self  Fed;Spoon   Solid  GO   Solid: Impaired Presentation: Self Fed Oral Phase Functional Implications: Impaired mastication       Maxcine Ham, M.A. CCC-SLP [phone]  Maxcine Ham 11/17/2015,2:45 PM

## 2015-11-17 NOTE — Care Management Important Message (Signed)
Important Message  Patient Details  Name: Jane Higgins MRN: [id number] Date of Birth: [birth year]   Medicare Important Message Given:  Yes    [name] 11/17/2015, 8:43 AM

## 2015-11-17 NOTE — Progress Notes (Signed)
[facility] Progress Note  Assessment/Plan: 1. AMS: Oriented X 2 now. Resolving. Missed HD 2 weeks.  2. ESRD - TTS @ [facility]. On HD today. K+3.5. 4.0 K bath.  3. Anemia - HGB 8.1 today. ESA dose today. Follow HGB  4. Secondary hyperparathyroidism -Ca 7.8 C Ca 8.92 Phos 3.7 restart OP phos lo when taking POs, resume hectoral,   5. HTN/volume - UFG 2 liters today. Pre wt 54.3. Tolerating HD well.  6. Nutrition - Albumin 2.6 Renal diet when able to eat 7. Bipolar disorder: per primary 8. EOL Care: Palliatve care following. Pt DNR, appears that she does not wish to continue on HD. Appreciate palliative care team's assistance.   Rita H. Brown NP-C 11/17/2015, 10:34 AM  [facility] [phone]  Pt seen, examined and agree w A/P as above.  Vinson Moselle MD [facility] pager [phone]    cell [phone] 11/17/2015, 11:06 AM    Subjective:  "I'm whatever you say". Denies SOB/chest pain. Oriented to person/place. Slow speech but appear alert.    Objective Filed Vitals:   11/17/15 0830 11/17/15 0900 11/17/15 0930 11/17/15 1000  BP: 151/84 156/75 161/78 158/77  Pulse: 67 70 71 72  Temp:      TempSrc:      Resp:      Height:      Weight:      SpO2:       Physical Exam General: thin female in NAD Heart: s1, S2, RRR Lungs: Bilateral breath sounds CTA Abdomen: soft, non-tender, active BS Extremities: no LE edema Dialysis Access: RIJ perm cath Drsg CDI. Maturing RUA AVF + bruit  Dialysis Orders: Dialysis: [facility] 4h 2/3.5 bath 400/800 52kg Heparin none R IJ cath (RUA AVF 8/26) MIrcera 225 q 2wks Hect 8 ug tiw   Additional Objective Labs: Basic Metabolic Panel:  Recent Labs Lab 11/15/15 0249  11/15/15 2130 11/16/15 0250 11/17/15 0238  NA 138  --   --  142 141  K 3.4*  --   --  2.9* 3.5  CL 100*  --   --  103 106  CO2 21*  --   --  24 23  GLUCOSE 67  --   --  120* 143*  BUN 25*  --   --  27* 38*  CREATININE  4.29*  --   --  4.32* 5.87*  CALCIUM 7.2*  --   --  7.7* 7.8*  PHOS 3.9  < > 2.9 2.9 3.7  < > = values in this interval not displayed. Liver Function Tests:  Recent Labs Lab 11/13/15 1240 11/14/15 0024  11/15/15 0249 11/16/15 0250 11/17/15 0238  AST 10* 14*  --   --   --   --   ALT 9* 12*  --   --   --   --   ALKPHOS 86 92  --   --   --   --   BILITOT 1.0 1.0  --   --   --   --   PROT 6.7 7.0  --   --   --   --   ALBUMIN 3.5 3.6  < > 2.5* 2.7* 2.6*  < > = values in this interval not displayed. No results for input(s): LIPASE, AMYLASE in the last 168 hours. CBC:  Recent Labs Lab 11/13/15 1240  11/14/15 0332 11/14/15 2002 11/15/15 0249 11/16/15 0250 11/17/15 0238  WBC 14.0*  < > 13.9* 15.6* 14.9* 13.3* 13.8*  NEUTROABS  12.7*  --   --   --   --   --   --   HGB 9.0*  < > 8.9* 8.8* 8.8* 8.2* 8.1*  HCT 29.4*  < > 27.7* 29.2* 29.5* 27.5* 27.6*  MCV 77.6*  < > 73.5* 76.0* 76.2* 76.4* 79.3  PLT 309  < > 269 332 209 235 190  < > = values in this interval not displayed. Blood Culture    Component Value Date/Time   SDES BLOOD LEFT HAND 11/14/2015 0040   SPECREQUEST IN PEDIATRIC BOTTLE 2CC 11/14/2015 0040   CULT NO GROWTH 2 DAYS 11/14/2015 0040   REPTSTATUS PENDING 11/14/2015 0040    Cardiac Enzymes:  Recent Labs Lab 11/13/15 2140 11/14/15 0024 11/14/15 0908 11/14/15 1032 11/14/15 1828  TROPONINI 0.91* 1.03* 0.89* 0.85* 0.64*   CBG:  Recent Labs Lab 11/16/15 1532 11/16/15 2022 11/17/15 0054 11/17/15 0151 11/17/15 0405  GLUCAP 77 79 66 150* 112*   Iron Studies: No results for input(s): IRON, TIBC, TRANSFERRIN, FERRITIN in the last 72 hours. @ Studies/Results: Dg Chest Port 1 View  11/17/2015  CLINICAL DATA:  60 year old female with renal failure, SIRS (systemic inflammatory response syndrome). Recently intubated. Initial encounter. EXAM: PORTABLE CHEST 1 VIEW COMPARISON:  11/14/2015 and earlier. FINDINGS: Portable AP semi upright view at 0457  hours. Extubated. Enteric tube removed. Mildly improved lung volumes. Stable cardiomegaly and mediastinal contours. Stable right IJ approach dialysis type catheter. No pneumothorax, pulmonary edema, pleural effusion or confluent pulmonary opacity. IMPRESSION: 1. Extubated and enteric tube removed. 2.  No acute cardiopulmonary abnormality. Electronically Signed   By: Odessa Fleming M.D.   On: 11/17/2015 06:49   Dg Abd Portable 1v  11/15/2015  CLINICAL DATA:  Status post NG placement EXAM: PORTABLE ABDOMEN - 1 VIEW COMPARISON:  11/14/2015 FINDINGS: A nasogastric catheter is noted coiled within the stomach. The tip is directed towards the gastric fundus. Scattered large and small bowel gas is noted. Calcifications consistent with chronic pancreatitis are noted. No free air is seen. IMPRESSION: Nasogastric catheter within the stomach. Electronically Signed   By: Alcide Clever M.D.   On: 11/15/2015 11:55   Medications:   . ampicillin-sulbactam (UNASYN) IV  1.5 g Intravenous Q12H  . antiseptic oral rinse  7 mL Mouth Rinse QID  . chlorhexidine gluconate  15 mL Mouth Rinse BID  . heparin  5,000 Units Subcutaneous 3 times per day  . hydrALAZINE  10 mg Intravenous Once  . insulin aspart  0-9 Units Subcutaneous 6 times per day  . lactulose  30 g Oral TID  . levETIRAcetam  250 mg Intravenous Q M,W,F-1800  . levETIRAcetam  500 mg Intravenous Q24H  . sodium chloride flush  3 mL Intravenous Q12H

## 2015-11-17 NOTE — Progress Notes (Signed)
Daily Progress Note   Patient Name: Jane Higgins       Date: 11/17/2015 DOB: [birth year]  Age: 60 y.o. MRN#: [id number] Attending Physician: Chilton Greathouse, MD Primary Care Physician: No primary care provider on file. Admit Date: 11/13/2015  Reason for Consultation/Follow-up: Establishing goals of care and Psychosocial/spiritual support  Subjective:   This NP Jane Higgins reviewed medical records, received report from team, assessed the patient and then meet with the patient's family to include daughters Jane Higgins # [phone], Jane Higgins # [phone], Son/Jane Higgins # [phone] no present, several of her sisters and brothers were present  Jane Higgins, Jane Higgins, Jane Higgins, a few nieces and nephews to discuss diagnosis prognosis, GOC, EOL wishes disposition and options.  A detailed discussion was had today regarding advanced directives.  Concepts specific to code status, artifical feeding and hydration, continued IV antibiotics and rehospitalization and specifically dialysis was had.  The difference between a aggressive medical intervention path  and a palliative comfort care path for this patient at this time was had.  Values and goals of care important to patient and family were attempted to be elicited.  Today's conversation centered around the patient's ability to make her own decisions regarding treatment acceptance and options and family's struggle dealing with her fluctuation in capacity.  They hope that the patient herself could make her own decisions and we discussed that there is no guarantee that Jane Higgins will ever regain decision making capacity.  We discussed the legal implications and that without a documented HPOA, her three children would be decision  makers with a majority rules.                                Today  she does not have the capacity to make her own health care decisions, she is unable to speak to her disease processes and the consequences of cessation of treatments.    Concept of Hospice and Palliative Care were discussed    EOL trajectory and expectations without dialysis discussed  Questions and concerns addressed.  Hard Choices booklet left for review. Family encouraged to call with questions or concerns.  PMT will continue to support holistically.  This is a difficult situation for this supportive family  Length of Stay: 4 days  Current Medications: Scheduled Meds:  . ampicillin-sulbactam (UNASYN) IV  1.5 g Intravenous Q12H  . antiseptic oral rinse  7 mL Mouth Rinse QID  . calcium acetate  2,001 mg Oral TID WC  . chlorhexidine gluconate  15 mL Mouth Rinse BID  . darbepoetin (ARANESP) injection - DIALYSIS  200 mcg Intravenous Q Thu-HD  . doxercalciferol      . doxercalciferol  8 mcg Intravenous Q T,Th,Sa-HD  . heparin  5,000 Units Subcutaneous 3 times per day  . hydrALAZINE  10 mg Intravenous Once  . insulin aspart  0-9 Units Subcutaneous 6 times per day  . lactulose  30 g Oral TID  . levETIRAcetam  250 mg Intravenous Q M,W,F-1800  . levETIRAcetam  500 mg Intravenous Q24H  . sodium chloride flush  3 mL Intravenous Q12H    Continuous Infusions:    PRN Meds: sodium chloride, acetaminophen **OR** acetaminophen, hydrALAZINE, LORazepam, morphine injection, ondansetron **OR** ondansetron (ZOFRAN) IV, senna-docusate, sodium chloride flush  Physical Exam: Physical Exam  Constitutional: She is oriented to person, place, and time. She appears cachectic. She is cooperative. She appears ill.  Cardiovascular: Tachycardia present.   Pulmonary/Chest: No respiratory distress.  Musculoskeletal:       Right shoulder: She exhibits decreased strength.  Neurological: She is alert and oriented to person, place, and time.    Skin: Skin is warm and dry.  Psychiatric: Cognition and memory are impaired. She expresses inappropriate judgment. She exhibits abnormal recent memory.                Vital Signs: BP 158/73 mmHg  Pulse 63  Temp(Src) 98.4 F (36.9 C) (Oral)  Resp 17  Ht  (1.651 m)  Wt 49.8 kg (109 lb 12.6 oz)  BMI 18.27 kg/m2  SpO2 94% SpO2: SpO2: 94 % O2 Device: O2 Device: Nasal Cannula O2 Flow Rate: O2 Flow Rate (L/min): 1 L/min  Intake/output summary:  Intake/Output Summary (Last 24 hours) at 11/17/15 1448 Last data filed at 11/17/15 1258  Gross per 24 hour  Intake  310.5 ml  Output   2000 ml  Net -1689.5 ml   LBM: Last BM Date: 11/15/15 Baseline Weight: Weight: 54 kg (119 lb 0.8 oz) Most recent weight: Weight: 49.8 kg (109 lb 12.6 oz) (bed scale)       Palliative Assessment/Data: Flowsheet Rows        Most Recent Value   Intake Tab    Referral Department  Critical care   Unit at Time of Referral  ICU   Palliative Care Primary Diagnosis  Nephrology   Date Notified  11/13/15   Palliative Care Type  New Palliative care   Reason for referral  Clarify Goals of Care   Date of Admission  11/13/15   Date first seen by Palliative Care  11/14/15   # of days Palliative referral response time  1 Day(s)   # of days IP prior to Palliative referral  0   Clinical Assessment    Palliative Performance Scale Score  10%   Pain Max last 24 hours  Not able to report   Pain Min Last 24 hours  Not able to report   Psychosocial & Spiritual Assessment    Palliative Care Outcomes    Patient/Family meeting held?  Yes   Who was at the meeting?  patients children, siblings   Palliative Care Outcomes  Clarified goals of care, Changed CPR status   Patient/Family wishes: Interventions discontinued/not  started   Vasopressors      Additional Data Reviewed: CBC    Component Value Date/Time   WBC 13.8* 11/17/2015 0238   RBC 3.48* 11/17/2015 0238   RBC 2.57* 05/28/2015 2203   HGB 8.1* 11/17/2015  0238   HCT 27.6* 11/17/2015 0238   PLT 190 11/17/2015 0238   MCV 79.3 11/17/2015 0238   MCH 23.3* 11/17/2015 0238   MCHC 29.3* 11/17/2015 0238   RDW 22.1* 11/17/2015 0238   LYMPHSABS 0.7 11/13/2015 1240   MONOABS 0.6 11/13/2015 1240   EOSABS 0.0 11/13/2015 1240   BASOSABS 0.0 11/13/2015 1240    CMP     Component Value Date/Time   NA 141 11/17/2015 0238   K 3.5 11/17/2015 0238   CL 106 11/17/2015 0238   CO2 23 11/17/2015 0238   GLUCOSE 143* 11/17/2015 0238   BUN 38* 11/17/2015 0238   CREATININE 5.87* 11/17/2015 0238   CREATININE 1.02 06/28/2007 1510   CALCIUM 7.8* 11/17/2015 0238   PROT 7.0 11/14/2015 0024   ALBUMIN 2.6* 11/17/2015 0238   AST 14* 11/14/2015 0024   ALT 12* 11/14/2015 0024   ALKPHOS 92 11/14/2015 0024   BILITOT 1.0 11/14/2015 0024   GFRNONAA 7* 11/17/2015 0238   GFRAA 8* 11/17/2015 0238       Problem List:  Patient Active Problem List   Diagnosis Date Noted  . Convulsions (HCC)   . Altered level of consciousness   . 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 SIRS (systemic inflammatory response syndrome) (HCC) 11/13/2015  . Uremia 10/04/2015  . Acute encephalopathy 10/04/2015  . Tobacco abuse 10/04/2015  . Dyspnea 10/04/2015  . Malnutrition of moderate degree (HCC) 05/30/2015  . ESRD needing dialysis (HCC)   . ARF (acute renal failure) (HCC) 05/28/2015  . Renal failure 05/28/2015  . Hypokalemia 05/28/2015  . Weight loss 05/28/2015  . DM2 (diabetes mellitus, type 2) (HCC) 05/28/2015  . Anemia 05/28/2015  . Schizophrenia (HCC) 04/16/2013  . Acute respiratory failure (HCC) 04/13/2013  . Generalized seizures (HCC) 04/12/2013  . Hypertensive urgency 04/12/2013  . Hyperglycemia 04/12/2013     Palliative Care Assessment & Plan    1.Code Status:  DNR    Code Status Orders        Start     Ordered   11/15/15 1011  Do not attempt resuscitation (DNR)   Continuous    Question Answer Comment  In the event of cardiac or respiratory ARREST Do  not call a "code blue"   In the event of cardiac or respiratory ARREST Do not perform Intubation, CPR, defibrillation or ACLS   In the event of cardiac or respiratory ARREST Use medication by any route, position, wound care, and other measures to relive pain and suffering. May use oxygen, suction and manual treatment of airway obstruction as needed for comfort.      11/15/15 1010    Code Status History    Date Active Date Inactive Code Status Order ID Comments User Context   11/13/2015  3:07 PM 11/15/2015 10:10 AM Full Code [id number]  Gwenyth Bender, NP ED   10/05/2015 12:05 AM 10/09/2015 10:00 PM Full Code [id number]  Edsel Petrin, DO Inpatient   05/28/2015  9:10 PM 06/05/2015  9:17 PM Full Code [id number]  Inez Catalina, MD Inpatient       2. Goals of Care/Additional Recommendations:  Continue dialysis   Consider SNF for rehabilitation on discharge   Psycho-social Needs: Education on Hospice  3. Symptom Management:  1.  Weakness: PT evaluation and treatment  4. Palliative Prophylaxis:   Aspiration, Bowel Regimen, Delirium Protocol, Frequent Pain Assessment and Oral Care  5. Prognosis: Unable to determine, dependant on desire for life prolonging meausres  6. Discharge Planning:  Skilled Nursing Facility for rehab with Palliative care service follow-up     Thank you for allowing the Palliative Medicine Team to assist in the care of this patient.   Time In:  0830 Time Out: 0930 Total Time 60 min Prolonged Time Billed  no         Canary Brim, NP  11/17/2015, 2:48 PM  Please contact Palliative Medicine Team phone at [phone] for questions and concerns.

## 2015-11-17 NOTE — Progress Notes (Signed)
Patient arrived back from dialysis, stable. Family at bedside and palliative care advised that patient has returned to ICU.

## 2015-11-17 NOTE — Progress Notes (Signed)
PULMONARY / CRITICAL CARE MEDICINE   Name: Jane Higgins MRN: [id number] DOB: 05-01-1956    ADMISSION DATE:  11/13/2015 CONSULTATION DATE:  11/14/15   REFERRING MD:  [facility] and rapid response call  CHIEF COMPLAINT:  Acue encephalopathy  HISTORY OF PRESENT ILLNESS:   60 year old female with end-stage renal disease, diabetes, hypertension, bipolar disorder and cachexia with protein calorie malnutrition. Was brought into the hospital by her son according to chart review after she has missezd dialysis for the last few weeks. Prior to admission for 24 hours she had progressive change in mental status. She was also noticed to be hypothermic with an elevated white count of 14,000 and a creatinine of 18 but without any apparent source of infection. From the emergency department she straight went to the dialysis suite where she was dialyzed off 1.5 L of fluid. According to nursing report she remained unresponsive except to name call in the dialysis unit. Upon return to stepdown unit she continued to remain unresponsive and at some point in time she was noticed to be foaming around her mouth associated with bradycardia and desaturations. Blood pressure was high between 409 and 811 systolic. Critical care medicine was then consulted. Phone conversation with neurology it appears most likely differential diagnosis is uremic encephalopathy.  Chart review also shows that she has preference not to undergo dialysis in the past. However son being the power of attorney insists on dialysis.   SUBJECTIVE:  Pt. Self-extubated yesterday.  Alert and oriented  Received HD yesterday.  Hypokalemic this am  DNR / No further escalation of care decided by family in palliative meeting yesterday. Plan to meet again today. Hopefully she can participate in this.      PAST MEDICAL HISTORY :  She  has a past medical history of Bipolar 1 disorder ([place]); Hypertension; Diabetes mellitus without complication ([place]); and  ESRD (end stage renal disease) ([place]).  PAST SURGICAL HISTORY: She  has past surgical history that includes Tubal ligation and AV fistula placement (Right, 06/03/2015).  Allergies  Allergen Reactions  . Iodine Other (See Comments)    unknown    No current facility-administered medications on file prior to encounter.   Current Outpatient Prescriptions on File Prior to Encounter  Medication Sig  . amLODipine (NORVASC) 5 MG tablet Take 1 tablet (5 mg total) by mouth daily. (Patient not taking: Reported on 10/04/2015)  . calcium acetate (PHOSLO) 667 MG capsule Take 3 capsules (2,001 mg total) by mouth 3 (three) times daily with meals. (Patient not taking: Reported on 11/15/2015)  . insulin NPH-regular Human (NOVOLIN 70/30) (70-30) 100 UNIT/ML injection Inject 5 Units into the skin 2 (two) times daily with a meal. (Patient not taking: Reported on 10/04/2015)  . metoprolol tartrate (LOPRESSOR) 25 MG tablet Take 1 tablet (25 mg total) by mouth 2 (two) times daily. (Patient not taking: Reported on 10/04/2015)  . multivitamin (RENA-VIT) TABS tablet Take 1 tablet by mouth at bedtime. (Patient not taking: Reported on 10/04/2015)  . Nutritional Supplements (FEEDING SUPPLEMENT, NEPRO CARB STEADY,) LIQD Take 237 mLs by mouth 2 (two) times daily between meals. (Patient not taking: Reported on 10/04/2015)    FAMILY HISTORY:  Her has no family status information on file.   SOCIAL HISTORY: She  reports that she has been smoking Cigarettes.  She has been smoking about 1.00 pack per day. She does not have any smokeless tobacco history on file. She reports that she does not drink alcohol or use illicit drugs.  VITAL SIGNS: BP 163/74 mmHg  Pulse 79  Temp(Src) 98.3 F (36.8 C) (Oral)  Resp 17  Ht _0  (1.651 m)  Wt 113 lb 12.1 oz (51.6 kg)  BMI 18.93 kg/m2  SpO2 96%  HEMODYNAMICS:    VENTILATOR SETTINGS:    INTAKE / OUTPUT: I/O last 3 completed shifts: In: 1243.3 [I.V.:173; NG/GT:462.8; IV  Piggyback:607.5] Out: 15 [Urine:15]  PHYSICAL EXAMINATION: General:  Extremely frail cachectic female.  Neuro:  Increased Alertness / Oriented.   HEENT:  Supple neck Cardiovascular:  Regular rate and rhythm and normotensive Lungs:  Appropriate rate, coarse breath sounds bilaterally.  Abdomen:  Soft scaphoid nontender no mass Musculoskeletal:  No cyanosis no clubbing no edema Skin:  Appears intact anteriorly and an exposed areas  LABS:  PULMONARY  Recent Labs Lab 11/13/15 2317 11/14/15 0230 11/14/15 0440 11/14/15 2350 11/16/15 0515  PHART 7.147* 7.251* 7.267* 7.414 7.471*  PCO2ART 34.1* 36.4 37.1 36.9 36.7  PO2ART 95.7 95.6 79.6* 188* 118.0*  HCO3 11.4* 15.6* 16.6* 23.4 26.8*  TCO2 12.5 16.7 17.7 24.6 28  O2SAT 94.4 96.0 93.7 99.6 99.0    CBC  Recent Labs Lab 11/15/15 0249 11/16/15 0250 11/17/15 0238  HGB 8.8* 8.2* 8.1*  HCT 29.5* 27.5* 27.6*  WBC 14.9* 13.3* 13.8*  PLT 209 235 190    COAGULATION  Recent Labs Lab 11/14/15 0024  INR 1.21    CARDIAC    Recent Labs Lab 11/13/15 2140 11/14/15 0024 11/14/15 0908 11/14/15 1032 11/14/15 1828  TROPONINI 0.91* 1.03* 0.89* 0.85* 0.64*   No results for input(s): PROBNP in the last 168 hours.   CHEMISTRY  Recent Labs Lab 11/14/15 0908 11/14/15 1032 11/14/15 1224 11/14/15 1915 11/14/15 2003 11/15/15 0249 11/15/15 1052 11/15/15 2130 11/16/15 0250 11/17/15 0238  NA  --   --  143  --  140 138  --   --  142 141  K  --   --  3.1*  --  3.4* 3.4*  --   --  2.9* 3.5  CL  --   --  102  --  98* 100*  --   --  103 106  CO2  --   --  17*  --  21* 21*  --   --  24 23  GLUCOSE  --   --  150*  --  105* 67  --   --  120* 143*  BUN  --   --  89*  --  95* 25*  --   --  27* 38*  CREATININE  --   --  10.87*  --  11.30* 4.29*  --   --  4.32* 5.87*  CALCIUM  --   --  7.5*  --  7.1* 7.2*  --   --  7.7* 7.8*  MG 1.8 1.8  --  1.9  --   --  1.7 1.7  --   --   PHOS 9.8* 10.3* 10.4* 11.9* 11.9* 3.9 4.3 2.9 2.9 3.7    Estimated Creatinine Clearance: 8.2 mL/min (by C-G formula based on Cr of 5.87).   LIVER  Recent Labs Lab 11/13/15 1240 11/14/15 0024 11/14/15 1224 11/14/15 2003 11/15/15 0249 11/16/15 0250 11/17/15 0238  AST 10* 14*  --   --   --   --   --   ALT 9* 12*  --   --   --   --   --   ALKPHOS 86 92  --   --   --   --   --  BILITOT 1.0 1.0  --   --   --   --   --   PROT 6.7 7.0  --   --   --   --   --   ALBUMIN 3.5 3.6 3.2* 3.2* 2.5* 2.7* 2.6*  INR  --  1.21  --   --   --   --   --      INFECTIOUS  Recent Labs Lab 11/13/15 2049 11/14/15 0023  11/14/15 0332 11/14/15 0908 11/15/15 0249 11/16/15 0250  LATICACIDVEN 1.1 2.1*  --  0.8  --   --   --   PROCALCITON  --   --   < >  --  0.58 3.23 4.34  < > = values in this interval not displayed.   ENDOCRINE CBG (last 3)   Recent Labs  11/17/15 0054 11/17/15 0151 11/17/15 0405  GLUCAP 66 150* 112*    Ammonia level 2/5/17equals 95 and elevated     IMAGING x48h  - image(s) personally visualized  -   highlighted in bold Dg Chest Port 1 View  11/17/2015  CLINICAL DATA:  60 year old female with renal failure, SIRS (systemic inflammatory response syndrome). Recently intubated. Initial encounter. EXAM: PORTABLE CHEST 1 VIEW COMPARISON:  11/14/2015 and earlier. FINDINGS: Portable AP semi upright view at 0457 hours. Extubated. Enteric tube removed. Mildly improved lung volumes. Stable cardiomegaly and mediastinal contours. Stable right IJ approach dialysis type catheter. No pneumothorax, pulmonary edema, pleural effusion or confluent pulmonary opacity. IMPRESSION: 1. Extubated and enteric tube removed. 2.  No acute cardiopulmonary abnormality. Electronically Signed   By: Genevie Ann M.D.   On: 11/17/2015 06:49   Dg Abd Portable 1v  11/15/2015  CLINICAL DATA:  Status post NG placement EXAM: PORTABLE ABDOMEN - 1 VIEW COMPARISON:  11/14/2015 FINDINGS: A nasogastric catheter is noted coiled within the stomach. The tip is directed towards  the gastric fundus. Scattered large and small bowel gas is noted. Calcifications consistent with chronic pancreatitis are noted. No free air is seen. IMPRESSION: Nasogastric catheter within the stomach. Electronically Signed   By: Inez Catalina M.D.   On: 11/15/2015 11:55      ASSESSMENT / PLAN:  PULMONARY A: Acute encephalopathy putting her at risk for need for intubation - Resolving Self-Extubated.   P:   Intermittent CXR > improved.  Abx for aspiration pneumonia.  Vanc / Zosyn 2/5 > 2/8 Unasyn 2/8 >  See ID.   CARDIOVASCULAR A:  Hypertensive At risk for MI Troponemia > ESRD, Hypertensive, No evidence of Acute MI.   P: Goal systolic blood pressure 737-106 - cardene gtt prn. severe Hypertension.  Echo read pending Restart home Norvasc / Lopressor for HTN.   RENAL A:    End-stage renal disease has missed dialysis 2 weeks. Not interested in dialysis. Son DURABLE POWER OF ATTORNEY insists on dialysis  GOC > DNR Hypokalemia - intermittent.  P:   Per renal  - dialysis today. Schedule is now TTS Goals of care needed  Daily Renal panel.  Replete electrolytes as indicated.  Ca corrects normal.   GASTROINTESTINAL A:   At risk for aspiration  P:   Nothing by mouth until SLP evaluation hopefully today.  PPI for Stress Ulcer PPX OG tube - tube feeds as tolerated.  Lactulose daily.   HEMATOLOGIC A:   anemia of chronic disease  P:  Packed red blood cells for hemoglobin less than 7 g percent No epo per renal.  Stable anemia.    INFECTIOUS  A:   SIRS + Q SOFA + No evidence of obvious infection but at high risk - Concern for Aspiration with Seizure  P:   Vancomycin 2/5 >>2/8 Zosyn 2/5 >>2/8 Unasyn 2/8 > STOP 2/9.  PCT 0.39 > 3.23 > 4. ESRD. Clinically stable.  Continue to follow PCT  ENDOCRINE A:   At risk for hypo-and hyperglycemia   P:    Monitor   NEUROLOGIC A:   Severe encephalopathy with high ammonia levels  -also uremic encephalopathy  Possible  seizure - EEG nonspecific for Epileptiform activity.   P:   RASS goal: -4 goal met.  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 Continue [place]  Neurology consultation > appreciate recommendations.  Palliative care consulted appreciate recs.    FAMILY  - Updates: Palliative care discussion on 2/7 leading to DNR status and decision not to escalate care. Family meeting today at which hopefully she will be able to set her own goals of care.   - Inter-disciplinary family meet or Palliative Care meeting due by:  Day 7 which is 11/20/2015   Paula Compton, MD Family Medicine - PGY 2

## 2015-11-17 NOTE — Progress Notes (Signed)
Speech Language Pathology Treatment:   Cancel note Patient Details Name: Jane Higgins MRN: [id number] DOB: [birth year] Today's Date: 11/17/2015  Pt in HD. Will try back in pm if possible.  Aziza Stuckert, Riley Nearing 11/17/2015, 11:03 AM

## 2015-11-18 DIAGNOSIS — Z66 Do not resuscitate: Secondary | ICD-10-CM

## 2015-11-18 DIAGNOSIS — Z515 Encounter for palliative care: Secondary | ICD-10-CM | POA: Insufficient documentation

## 2015-11-18 DIAGNOSIS — R531 Weakness: Secondary | ICD-10-CM | POA: Insufficient documentation

## 2015-11-18 LAB — CULTURE, BLOOD (ROUTINE X 2)
CULTURE: NO GROWTH
Culture: NO GROWTH

## 2015-11-18 LAB — GLUCOSE, CAPILLARY
GLUCOSE-CAPILLARY: 171 mg/dL — AB (ref 65–99)
GLUCOSE-CAPILLARY: 245 mg/dL — AB (ref 65–99)
Glucose-Capillary: 134 mg/dL — ABNORMAL HIGH (ref 65–99)
Glucose-Capillary: 137 mg/dL — ABNORMAL HIGH (ref 65–99)
Glucose-Capillary: 164 mg/dL — ABNORMAL HIGH (ref 65–99)
Glucose-Capillary: 172 mg/dL — ABNORMAL HIGH (ref 65–99)
Glucose-Capillary: 184 mg/dL — ABNORMAL HIGH (ref 65–99)

## 2015-11-18 LAB — RENAL FUNCTION PANEL
Albumin: 2.7 g/dL — ABNORMAL LOW (ref 3.5–5.0)
Anion gap: 15 (ref 5–15)
BUN: 14 mg/dL (ref 6–20)
CO2: 26 mmol/L (ref 22–32)
Calcium: 8.3 mg/dL — ABNORMAL LOW (ref 8.9–10.3)
Chloride: 99 mmol/L — ABNORMAL LOW (ref 101–111)
Creatinine, Ser: 3.54 mg/dL — ABNORMAL HIGH (ref 0.44–1.00)
GFR calc Af Amer: 15 mL/min — ABNORMAL LOW (ref >60)
GFR calc non Af Amer: 13 mL/min — ABNORMAL LOW (ref >60)
Glucose, Bld: 201 mg/dL — ABNORMAL HIGH (ref 65–99)
Phosphorus: 2.7 mg/dL (ref 2.5–4.6)
Potassium: 3.2 mmol/L — ABNORMAL LOW (ref 3.5–5.1)
Sodium: 140 mmol/L (ref 135–145)

## 2015-11-18 LAB — CBC
HEMATOCRIT: 35.5 % — AB (ref 36.0–46.0)
HEMOGLOBIN: 9.8 g/dL — AB (ref 12.0–15.0)
MCH: 22.5 pg — ABNORMAL LOW (ref 26.0–34.0)
MCHC: 27.6 g/dL — ABNORMAL LOW (ref 30.0–36.0)
MCV: 81.6 fL (ref 78.0–100.0)
Platelets: 194 10*3/uL (ref 150–400)
RBC: 4.35 MIL/uL (ref 3.87–5.11)
RDW: 21.8 % — ABNORMAL HIGH (ref 11.5–15.5)
WBC: 11.8 10*3/uL — AB (ref 4.0–10.5)

## 2015-11-18 MED ORDER — LEVETIRACETAM 250 MG PO TABS
250.0000 mg | ORAL_TABLET | ORAL | Status: DC
  Administered 2015-11-18 – 2015-11-21 (×2): 250 mg via ORAL
  Filled 2015-11-18 (×2): qty 1

## 2015-11-18 MED ORDER — LEVETIRACETAM 500 MG PO TABS
500.0000 mg | ORAL_TABLET | ORAL | Status: DC
  Administered 2015-11-19 – 2015-11-21 (×3): 500 mg via ORAL
  Filled 2015-11-18 (×3): qty 1

## 2015-11-18 MED ORDER — NEPRO/CARBSTEADY PO LIQD
237.0000 mL | Freq: Two times a day (BID) | ORAL | Status: DC
  Administered 2015-11-19 – 2015-11-21 (×4): 237 mL via ORAL

## 2015-11-18 NOTE — Care Management Note (Addendum)
Case Management Note  Patient Details  Name: Jane Higgins MRN: [id number] Date of Birth: Apr 19, 1956  Subjective/Objective:                Patient transferred from ICU, initial assessment.  Spoke with sister Misty Stanley at the bedside to assess home needs. Patient stated that she would be agreeable to have dialysis at home after discharge, however sister states that with her severe Bipolar disorder and Schizophrenia that patient will not be compliant with treatment plan once she gets home. Misty Stanley states that she has had to have her IVC'd in the past. CM spoke with Dr Elvera Lennox and sister about competancy. Dr Elvera Lennox agrees with sister that patient does not have competency to care for herself at home. Patient lives with son, whom CM spoke with on the phone, who states that patient is home alone for about 6 hours a day. During this time patient would not accept HH or SCAT to take to HD. Patient receives HD on [facility] [facility]. PT will evaluate for skilled need. Patient will need to establish with PCP prior to [facility] being establish (as dispo plan is established)   Action/Plan:   Expected Discharge Date:                  Expected Discharge Plan:  Home w Home Health Services  In-House Referral:     Discharge planning Services  CM Consult  Post Acute Care Choice:    Choice offered to:     DME Arranged:    DME Agency:     HH Arranged:    HH Agency:     Status of Service:  In process, will continue to follow  Medicare Important Message Given:  Yes Date Medicare IM Given:    Medicare IM give by:    Date Additional Medicare IM Given:    Additional Medicare Important Message give by:     If discussed at Long Length of Stay Meetings, dates discussed:    Additional Comments:  Lawerance Sabal, RN 11/18/2015, 12:08 PM

## 2015-11-18 NOTE — Progress Notes (Signed)
UR Completed. Amariyana Heacox, RN, BSN.  [phone] 

## 2015-11-18 NOTE — Progress Notes (Signed)
PROGRESS NOTE  TALMA AGUILLARD ZOX:[id number] DOB: 05-27-1956 DOA: 11/13/2015 PCP: No primary care provider on file.   HPI: 60 year old female with end-stage renal disease, diabetes, hypertension, bipolar disorder, is being brought to the hospital by her son as patient has refused to have dialysis done for the last couple weeks and she has been progressively more altered.   Subjective / 24 H Interval events - Patient has no complaints for me, she is feeling well this morning, has no recollection of the last few days, states that she would want dialysis.  Assessment/Plan: Principal Problem:   Uremic encephalopathy Active Problems:   Hyperglycemia   Schizophrenia (HCC)   DM2 (diabetes mellitus, type 2) (HCC)   Anemia   ESRD needing dialysis (HCC)   SIRS (systemic inflammatory response syndrome) (HCC)   Convulsions (HCC)   Altered level of consciousness   Palliative care encounter   DNR (do not resuscitate)   Weakness generalized   Uremic encephalopathy in the setting of ESRD - Shortly after admission, patient's mental status significantly deteriorated requiring ICU admission and intubation with mechanical ventilation. She was successfully extubated on 2/9, transferred to the [facility] service on 2/10 on room air. - Nephrology was consulted and are following, patient underwent hemodialysis with improvement in her mental status.  - I do not think however that she has capacity to make medical decisions, as she cannot tell me the reason why she is in the hospital, she denies refusing hemodialysis and has no recollection of recent events. She cannot tell me the consequences of her actions in terms of medical outcome - Discussed with Dr. Arlean Hopping, appreciate nephrology input  Goals of care - Palliative care has been consulted and are following, ongoing discussions regarding patient's wishes versus family wishes. Family feels like patient has underlying bipolar disorder and cannot make  significant medical decisions for herself, they acknowledged the fact that she doesn't want dialysis however there views are different. Appreciate palliative care input, discussed with Lorinda Creed, NP today   SIRS/ Leukocytosis - Suspected? Aspiration, she was on Unasyn 2/08 >> 2/09 and Vancomycin / Zosyn 2/5 >> 2/7 - Monitor off antibiotics, chest x-ray clear, she is on room air, leukocytosis improving cultures have remained negative. In total she has received 5 days of IV antibiotics  Acute hypoxic respiratory failure / ventilator dependent respiratory failure - Extubated on 2/9, chest x-ray clear   Diabetes - Home regimen includes Novolin 70/30. - Continue sliding scale, CBG is controlled   Schizophrenia/bipolar - Patient has been prescribed medications but does not take.  - May need psychiatry consult, will see how she does in the next day  Possible seizures - Neurology was consulted, appreciate input, continue Keppra at 500 mg daily with additional 250 mg post-each hemodialysis  Anemia of chronic disease - Hemoglobin stable, no bleeding  Hypokalemia - Intermittent, replete as needed, per HD   Diet: DIET DYS 2 Room service appropriate?: Yes; Fluid consistency:: Thin Fluids: none  DVT Prophylaxis: heparin  Code Status: DNR Family Communication: d/w daughter on the hallway  Disposition Plan: home vs SNF when ready   Barriers to discharge: Ongoing GOC discussions   Consultants:  Nephrology   PCCM  Neurology  Palliative   Procedures:  EEG  Intubation / extubated 2/9   Antibiotics Unasyn 2/08 >> 2/09 Vancomycin / Zosyn 2/5 >> 2/7   Studies  Dg Chest Port 1 View  11/17/2015  CLINICAL DATA:  61 year old female with renal failure, SIRS (systemic inflammatory response syndrome).  Recently intubated. Initial encounter. EXAM: PORTABLE CHEST 1 VIEW COMPARISON:  11/14/2015 and earlier. FINDINGS: Portable AP semi upright view at 0457 hours. Extubated. Enteric tube  removed. Mildly improved lung volumes. Stable cardiomegaly and mediastinal contours. Stable right IJ approach dialysis type catheter. No pneumothorax, pulmonary edema, pleural effusion or confluent pulmonary opacity. IMPRESSION: 1. Extubated and enteric tube removed. 2.  No acute cardiopulmonary abnormality. Electronically Signed   By: Odessa Fleming M.D.   On: 11/17/2015 06:49    Objective  Filed Vitals:   11/17/15 1300 11/17/15 1400 11/18/15 0600 11/18/15 1351  BP: 160/72 158/73 163/78 146/79  Pulse: 69 63 79 82  Temp:   98.1 F (36.7 C) 98.2 F (36.8 C)  TempSrc:   Oral Oral  Resp: 15 17 21 19   Height:      Weight:   50.168 kg (110 lb 9.6 oz)   SpO2: 92% 94% 97% 98%    Intake/Output Summary (Last 24 hours) at 11/18/15 1421 Last data filed at 11/18/15 0300  Gross per 24 hour  Intake    120 ml  Output      0 ml  Net    120 ml   Filed Weights   11/17/15 0754 11/17/15 1125 11/18/15 0600  Weight: 54.3 kg (119 lb 11.4 oz) 49.8 kg (109 lb 12.6 oz) 50.168 kg (110 lb 9.6 oz)   Exam:  GENERAL: NAD  HEENT: no scleral icterus, PERRL  NECK: supple, no LAD  LUNGS: CTA biL, no wheezing  HEART: RRR without MRG  ABDOMEN: soft, non tender  EXTREMITIES: no clubbing / cyanosis  NEUROLOGIC: non focal  Data Reviewed: Basic Metabolic Panel:  Recent Labs Lab 11/14/15 0908 11/14/15 1032  11/14/15 1915 11/14/15 2003 11/15/15 0249 11/15/15 1052 11/15/15 2130 11/16/15 0250 11/17/15 0238 11/18/15 0549  NA  --   --   < >  --  140 138  --   --  142 141 140  K  --   --   < >  --  3.4* 3.4*  --   --  2.9* 3.5 3.2*  CL  --   --   < >  --  98* 100*  --   --  103 106 99*  CO2  --   --   < >  --  21* 21*  --   --  24 23 26   GLUCOSE  --   --   < >  --  105* 67  --   --  120* 143* 201*  BUN  --   --   < >  --  95* 25*  --   --  27* 38* 14  CREATININE  --   --   < >  --  11.30* 4.29*  --   --  4.32* 5.87* 3.54*  CALCIUM  --   --   < >  --  7.1* 7.2*  --   --  7.7* 7.8* 8.3*  MG 1.8 1.8  --   1.9  --   --  1.7 1.7  --   --   --   PHOS 9.8* 10.3*  < > 11.9* 11.9* 3.9 4.3 2.9 2.9 3.7 2.7  < > = values in this interval not displayed. Liver Function Tests:  Recent Labs Lab 11/13/15 1240 11/14/15 0024  11/14/15 2003 11/15/15 0249 11/16/15 0250 11/17/15 0238 11/18/15 0549  AST 10* 14*  --   --   --   --   --   --  ALT 9* 12*  --   --   --   --   --   --   ALKPHOS 86 92  --   --   --   --   --   --   BILITOT 1.0 1.0  --   --   --   --   --   --   PROT 6.7 7.0  --   --   --   --   --   --   ALBUMIN 3.5 3.6  < > 3.2* 2.5* 2.7* 2.6* 2.7*  < > = values in this interval not displayed. No results for input(s): LIPASE, AMYLASE in the last 168 hours.  Recent Labs Lab 11/13/15 1240  AMMONIA 95*   CBC:  Recent Labs Lab 11/13/15 1240  11/14/15 2002 11/15/15 0249 11/16/15 0250 11/17/15 0238 11/18/15 0549  WBC 14.0*  < > 15.6* 14.9* 13.3* 13.8* 11.8*  NEUTROABS 12.7*  --   --   --   --   --   --   HGB 9.0*  < > 8.8* 8.8* 8.2* 8.1* 9.8*  HCT 29.4*  < > 29.2* 29.5* 27.5* 27.6* 35.5*  MCV 77.6*  < > 76.0* 76.2* 76.4* 79.3 81.6  PLT 309  < > 332 209 235 190 194  < > = values in this interval not displayed. Cardiac Enzymes:  Recent Labs Lab 11/13/15 2140 11/14/15 0024 11/14/15 0908 11/14/15 1032 11/14/15 1828  TROPONINI 0.91* 1.03* 0.89* 0.85* 0.64*   CBG:  Recent Labs Lab 11/17/15 2101 11/18/15 0007 11/18/15 0431 11/18/15 0809 11/18/15 1207  GLUCAP 137* 164* 171* 134* 137*    Recent Results (from the past 240 hour(s))  Culture, blood (routine x 2)     Status: None   Collection Time: 11/13/15  3:09 PM  Result Value Ref Range Status   Specimen Description BLOOD RIGHT ARM  Final   Special Requests BOTTLES DRAWN AEROBIC AND ANAEROBIC 5CC  Final   Culture NO GROWTH 5 DAYS  Final   Report Status 11/18/2015 FINAL  Final  Culture, blood (routine x 2)     Status: None   Collection Time: 11/13/15  3:59 PM  Result Value Ref Range Status   Specimen Description  BLOOD RIGHT HAND  Final   Special Requests BOTTLES DRAWN AEROBIC AND ANAEROBIC 5CC  Final   Culture NO GROWTH 5 DAYS  Final   Report Status 11/18/2015 FINAL  Final  MRSA PCR Screening     Status: None   Collection Time: 11/13/15  9:09 PM  Result Value Ref Range Status   MRSA by PCR NEGATIVE NEGATIVE Final    Comment:        The GeneXpert MRSA Assay (FDA approved for NASAL specimens only), is one component of a comprehensive MRSA colonization surveillance program. It is not intended to diagnose MRSA infection nor to guide or monitor treatment for MRSA infections.   Culture, blood (Routine X 2) w Reflex to ID Panel     Status: None (Preliminary result)   Collection Time: 11/14/15 12:24 AM  Result Value Ref Range Status   Specimen Description BLOOD LEFT ANTECUBITAL  Final   Special Requests IN PEDIATRIC BOTTLE 3CC  Final   Culture NO GROWTH 4 DAYS  Final   Report Status PENDING  Incomplete  Culture, blood (Routine X 2) w Reflex to ID Panel     Status: None (Preliminary result)   Collection Time: 11/14/15 12:40 AM  Result Value Ref Range  Status   Specimen Description BLOOD LEFT HAND  Final   Special Requests IN PEDIATRIC BOTTLE 2CC  Final   Culture NO GROWTH 4 DAYS  Final   Report Status PENDING  Incomplete     Scheduled Meds: . calcium acetate  2,001 mg Oral TID WC  . darbepoetin (ARANESP) injection - DIALYSIS  200 mcg Intravenous Q Thu-HD  . doxercalciferol  8 mcg Intravenous Q T,Th,Sa-HD  . feeding supplement (NEPRO CARB STEADY)  237 mL Oral BID BM  . heparin  5,000 Units Subcutaneous 3 times per day  . hydrALAZINE  10 mg Intravenous Once  . insulin aspart  0-9 Units Subcutaneous 6 times per day  . lactulose  30 g Oral TID  . [START ON 11/19/2015] levETIRAcetam  500 mg Oral Q24H   And  . levETIRAcetam  250 mg Oral Once per day on Mon Wed Fri  . sodium chloride flush  3 mL Intravenous Q12H   Continuous Infusions:   Time spent coordinating care: 35 minutes, greater than  50% of this time was spent in counseling, explanation of diagnosis, planning of further management, discussing with family  Pamella Pert, MD [facility] Pager [phone]. If 7 PM - 7 AM, please contact night-coverage at [email or web address], password [facility] 11/18/2015, 2:21 PM  LOS: 5 days

## 2015-11-18 NOTE — Progress Notes (Signed)
Speech Language Pathology Treatment: Dysphagia  Patient Details Name: VERTA Higgins MRN: [id number] DOB: [birth year] Today's Date: 11/18/2015 Time: 1151-1200 SLP Time Calculation (min) (ACUTE ONLY): 9 min  Assessment / Plan / Recommendation Clinical Impression  Pt's voice is stronger today, and she shows no overt s/s of aspiration under skilled observation. Mastication remains prolonged due to missing dentition. When coupled with her AMS, would favor continuing Dys 2 diet to facilitate oral clearance and thin liquids. No family present to ask about dentures. Will continue to follow.   HPI HPI: 60 year old female with end-stage renal disease, diabetes, hypertension, bipolar disorder and cachexia with protein calorie malnutrition. Pt brought to ED by family for AMS after missing several weeks of dialysis. Pt started having seizure activity 2/6 requiring intubation. Pt self-extubated on 2/8.      SLP Plan  Continue with current plan of care     Recommendations  Diet recommendations: Dysphagia 2 (fine chop);Thin liquid Liquids provided via: Cup;Straw Medication Administration: Whole meds with liquid Supervision: Patient able to self feed;Full supervision/cueing for compensatory strategies Compensations: Minimize environmental distractions;Slow rate;Small sips/bites Postural Changes and/or Swallow Maneuvers: Seated upright 90 degrees             Oral Care Recommendations: Oral care BID Follow up Recommendations: None Plan: Continue with current plan of care     GO               Maxcine Ham, M.A. CCC-SLP [phone]  Maxcine Ham 11/18/2015, 12:14 PM

## 2015-11-18 NOTE — Progress Notes (Signed)
[facility] Progress Note  Assessment/Plan: 1. AMS: Resolved. Missed HD 2 weeks.  2. ESRD - TTS HD 3. Anemia - HGB 8 ESA dose today. Follow HGB  4. Secondary hyperparathyroidism -Ca 7.8 C Ca 8.92 Phos 3.7 restart OP phos lo when taking POs, resume hectoral,   5. HTN/volume - UFG 2 liters today. Pre wt 54.3. Tolerating HD well.  6. Nutrition - Albumin 2.6 Renal diet when able to eat 7. Bipolar disorder: per primary 8. EOL - Pt DNR.  Very poor dialysis compliance; question of whether patient wants to continue dialysis or not and also question of competency.  Appreciate pall care assistance.    Vinson Moselle MD [facility] pager [phone]    cell [phone] 11/18/2015, 11:51 AM    Subjective:  Fully oriented today  Objective Filed Vitals:   11/17/15 1216 11/17/15 1300 11/17/15 1400 11/18/15 0600  BP: 167/80 160/72 158/73 163/78  Pulse: 86 69 63 79  Temp:    98.1 F (36.7 C)  TempSrc:    Oral  Resp: Height:      Weight:    50.168 kg (110 lb 9.6 oz)  SpO2: 97% 92% 94% 97%   Physical Exam General: thin female in NAD Heart: s1, S2, RRR Lungs: Bilateral breath sounds CTA Abdomen: soft, non-tender, active BS Extremities: no LE edema Dialysis Access: RIJ perm cath Drsg CDI. Maturing RUA AVF + bruit  Dialysis Orders: Dialysis: [facility] 4h 2/3.5 bath 400/800 52kg Heparin none R IJ cath (RUA AVF 8/26) MIrcera 225 q 2wks Hect 8 ug tiw   Additional Objective Labs: Basic Metabolic Panel:  Recent Labs Lab 11/16/15 0250 11/17/15 0238 11/18/15 0549  NA 142 141 140  K 2.9* 3.5 3.2*  CL 103 106 99*  CO2 GLUCOSE 120* 143* 201*  BUN 27* 38* 14  CREATININE 4.32* 5.87* 3.54*  CALCIUM 7.7* 7.8* 8.3*  PHOS 2.9 3.7 2.7   Liver Function Tests:  Recent Labs Lab 11/13/15 1240 11/14/15 0024  11/16/15 0250 11/17/15 0238 11/18/15 0549  AST 10* 14*  --   --   --   --   ALT 9* 12*  --   --   --   --   ALKPHOS 86 92   --   --   --   --   BILITOT 1.0 1.0  --   --   --   --   PROT 6.7 7.0  --   --   --   --   ALBUMIN 3.5 3.6  < > 2.7* 2.6* 2.7*  < > = values in this interval not displayed. No results for input(s): LIPASE, AMYLASE in the last 168 hours. CBC:  Recent Labs Lab 11/13/15 1240  11/14/15 2002 11/15/15 0249 11/16/15 0250 11/17/15 0238 11/18/15 0549  WBC 14.0*  < > 15.6* 14.9* 13.3* 13.8* 11.8*  NEUTROABS 12.7*  --   --   --   --   --   --   HGB 9.0*  < > 8.8* 8.8* 8.2* 8.1* 9.8*  HCT 29.4*  < > 29.2* 29.5* 27.5* 27.6* 35.5*  MCV 77.6*  < > 76.0* 76.2* 76.4* 79.3 81.6  PLT 309  < > 332 209 235 190 194  < > = values in this interval not displayed. Blood Culture    Component Value Date/Time   SDES BLOOD LEFT HAND 11/14/2015 0040   SPECREQUEST IN PEDIATRIC BOTTLE [facility] 11/14/2015 0040  CULT NO GROWTH 3 DAYS 11/14/2015 0040   REPTSTATUS PENDING 11/14/2015 0040    Cardiac Enzymes:  Recent Labs Lab 11/13/15 2140 11/14/15 0024 11/14/15 0908 11/14/15 1032 11/14/15 1828  TROPONINI 0.91* 1.03* 0.89* 0.85* 0.64*   CBG:  Recent Labs Lab 11/17/15 1656 11/17/15 2101 11/18/15 0007 11/18/15 0431 11/18/15 0809  GLUCAP 87 137* 164* 171* 134*   Iron Studies: No results for input(s): IRON, TIBC, TRANSFERRIN, FERRITIN in the last 72 hours. @ Studies/Results: Dg Chest Port 1 View  11/17/2015  CLINICAL DATA:  60 year old female with renal failure, SIRS (systemic inflammatory response syndrome). Recently intubated. Initial encounter. EXAM: PORTABLE CHEST 1 VIEW COMPARISON:  11/14/2015 and earlier. FINDINGS: Portable AP semi upright view at 0457 hours. Extubated. Enteric tube removed. Mildly improved lung volumes. Stable cardiomegaly and mediastinal contours. Stable right IJ approach dialysis type catheter. No pneumothorax, pulmonary edema, pleural effusion or confluent pulmonary opacity. IMPRESSION: 1. Extubated and enteric tube removed. 2.  No acute cardiopulmonary abnormality.  Electronically Signed   By: Odessa Fleming M.D.   On: 11/17/2015 06:49   Medications:   . calcium acetate  2,001 mg Oral TID WC  . darbepoetin (ARANESP) injection - DIALYSIS  200 mcg Intravenous Q Thu-HD  . doxercalciferol  8 mcg Intravenous Q T,Th,Sa-HD  . heparin  5,000 Units Subcutaneous 3 times per day  . hydrALAZINE  10 mg Intravenous Once  . insulin aspart  0-9 Units Subcutaneous 6 times per day  . lactulose  30 g Oral TID  . [START ON 11/19/2015] levETIRAcetam  500 mg Oral Q24H   And  . levETIRAcetam  250 mg Oral Once per day on Mon Wed Fri  . sodium chloride flush  3 mL Intravenous Q12H

## 2015-11-18 NOTE — Evaluation (Signed)
Physical Therapy Evaluation Patient Details Name: Jane Higgins MRN: [id number] DOB: [birth year] Today's Date: 11/18/2015   History of Present Illness  Patient is a 60 yo female admitted 11/13/15 with AMS after missing several weeks of HD.  Patient having seizure activity 2/6 requiring intubation.  Self-extubated 11/16/15.   PMH:  ESRD on HD, DM, HTN, bipolar disorder, schizophrenia  Clinical Impression  Patient presents with problems listed below.  Will benefit from acute PT to maximize functional mobility prior to discharge. Patient with poor balance, especially during gait.  Requires mod assist to prevent falls during gait with RW.  Recommend SNF at discharge for continued therapy.    Follow Up Recommendations SNF;Supervision/Assistance - 24 hour    Equipment Recommendations  None recommended by PT    Recommendations for Other Services       Precautions / Restrictions Precautions Precautions: Fall Restrictions Weight Bearing Restrictions: No      Mobility  Bed Mobility Overal bed mobility: Needs Assistance Bed Mobility: Supine to Sit;Sit to Supine     Supine to sit: Min guard Sit to supine: Min guard   General bed mobility comments: Assist for safety.  Patient c/o dizziness in sitting.  Cleared with time.  Transfers Overall transfer level: Needs assistance Equipment used: Rolling walker (2 wheeled) Transfers: Sit to/from Stand Sit to Stand: Min assist         General transfer comment: Verbal and tactile cues for hand placement.  Assist to steady during transfers.  Ambulation/Gait Ambulation/Gait assistance: Min assist;Mod assist Ambulation Distance (Feet): 30 Feet Assistive device: Rolling walker (2 wheeled) Gait Pattern/deviations: Step-through pattern;Decreased step length - right;Decreased step length - left;Decreased stride length;Staggering left;Staggering right   Gait velocity interpretation: Below normal speed for age/gender General Gait Details: Verbal  cues for safe use of RW.  Patient with unstable gait, staggering to both sides inside RW.  Assist required to prevent falls.  Patient required assist to maneuver RW around objects - running into sink and foot of bed.    Stairs            Wheelchair Mobility    Modified Rankin (Stroke Patients Only)       Balance Overall balance assessment: Needs assistance         Standing balance support: Bilateral upper extremity supported Standing balance-Leahy Scale: Poor                               Pertinent Vitals/Pain Pain Assessment: Faces Faces Pain Scale: Hurts a little bit Pain Location: Headache Pain Descriptors / Indicators: Headache Pain Intervention(s): Monitored during session    Home Living Family/patient expects to be discharged to:: Private residence Living Arrangements: Children Available Help at Discharge: Family;Available PRN/intermittently (Per chart, patient home alone 6 hours/day) Type of Home: House Home Access: Level entry     Home Layout: One level Home Equipment: Walker - 2 wheels Additional Comments: Information from chart    Prior Function Level of Independence: Independent with assistive device(s)               Hand Dominance        Extremity/Trunk Assessment   Upper Extremity Assessment: Generalized weakness           Lower Extremity Assessment: Generalized weakness         Communication   Communication: Expressive difficulties (Difficult to understand at times)  Cognition Arousal/Alertness: Awake/alert Behavior During Therapy: WFL for tasks assessed/performed;Flat  affect Overall Cognitive Status: No family/caregiver present to determine baseline cognitive functioning                      General Comments      Exercises        Assessment/Plan    PT Assessment Patient needs continued PT services  PT Diagnosis Difficulty walking;Abnormality of gait;Generalized weakness;Altered mental status    PT Problem List Decreased strength;Decreased activity tolerance;Decreased balance;Decreased mobility;Decreased cognition;Decreased knowledge of use of DME;Decreased safety awareness  PT Treatment Interventions DME instruction;Gait training;Functional mobility training;Therapeutic activities;Therapeutic exercise;Balance training;Cognitive remediation;Patient/family education   PT Goals (Current goals can be found in the Care Plan section) Acute Rehab PT Goals Patient Stated Goal: None stated PT Goal Formulation: With patient Time For Goal Achievement: 11/25/15 Potential to Achieve Goals: Good    Frequency Min 3X/week   Barriers to discharge Decreased caregiver support Patient is home alone for part of the day.    Co-evaluation               End of Session Equipment Utilized During Treatment: Gait belt Activity Tolerance: Patient tolerated treatment well;Patient limited by fatigue Patient left: in bed;with call bell/phone within reach;with bed alarm set Nurse Communication: Mobility status         Time: 8469-6295 PT Time Calculation (min) (ACUTE ONLY): 19 min   Charges:   PT Evaluation $PT Eval Moderate Complexity: 1 Procedure     PT G CodesVena Austria 12-05-15, 7:47 PM Durenda Hurt. Renaldo Fiddler, [facility] Pager [phone]

## 2015-11-18 NOTE — Progress Notes (Signed)
Nutrition Follow-up  DOCUMENTATION CODES:   Non-severe (moderate) malnutrition in context of chronic illness, Underweight  INTERVENTION:   Nepro Shake po BID, each supplement provides 425 kcal and 19 grams protein  NUTRITION DIAGNOSIS:   Malnutrition related to chronic illness as evidenced by mild depletion of body fat, moderate depletion of body fat, moderate depletions of muscle mass, severe depletion of muscle mass.  Ongoing  GOAL:   Patient will meet greater than or equal to 90% of their needs  Unmet  MONITOR:   Labs, Weight trends, TF tolerance, I & O's  REASON FOR ASSESSMENT:   Consult Enteral/tube feeding initiation and management  ASSESSMENT:   60 year old female with end-stage renal disease, diabetes, hypertension, bipolar disorder and cachexia with protein calorie malnutrition. Was brought into the hospital by her son according to chart review after she has missezd dialysis for the last few weeks.  Pt in bed at time of visit, minimally responsive. No family in room to provide hx.   Pt with seizure activity on 11/14/15 and was intubated. Pt self-extubated on 11/16/15. Pt also removed NGT on 11/16/15.  Pt was transferred from ICU to medical floor on 11/17/15.  Pt underwent BSE by SLP on 11/17/15; recommended dysphagia 2 diet with nectar thick liquids. Meal completion poor; PO: 30% per doc flowsheets.   Palliative care team following; plan to continue HD for now, but continuing GOC discussions with family members due to poor prognosis.   Labs reviewed: K: 3.2, CBGS: 134-171.   Diet Order:  DIET DYS 2 Room service appropriate?: Yes; Fluid consistency:: Thin  Skin:  Reviewed, no issues  Last BM:  11/18/15  Height:   Ht Readings from Last 1 Encounters:  11/14/15  (1.651 m)    Weight:   Wt Readings from Last 1 Encounters:  11/18/15 110 lb 9.6 oz (50.168 kg)    Ideal Body Weight:  56.8 kg  BMI:  Body mass index is 18.4 kg/(m^2).  Estimated Nutritional  Needs:   Kcal:  1500-1700  Protein:  70-80 grams  Fluid:  1.2 L  EDUCATION NEEDS:   No education needs identified at this time  Deone Leifheit A. Mayford Knife, RD, LDN, CDE Pager: [phone] After hours Pager: [phone]

## 2015-11-19 LAB — COMPREHENSIVE METABOLIC PANEL
ALBUMIN: 2.9 g/dL — AB (ref 3.5–5.0)
ALT: 22 U/L (ref 14–54)
AST: 27 U/L (ref 15–41)
Alkaline Phosphatase: 81 U/L (ref 38–126)
Anion gap: 10 (ref 5–15)
BUN: 7 mg/dL (ref 6–20)
CHLORIDE: 99 mmol/L — AB (ref 101–111)
CO2: 30 mmol/L (ref 22–32)
CREATININE: 2.19 mg/dL — AB (ref 0.44–1.00)
Calcium: 8.1 mg/dL — ABNORMAL LOW (ref 8.9–10.3)
GFR calc non Af Amer: 23 mL/min — ABNORMAL LOW (ref >60)
GFR, EST AFRICAN AMERICAN: 27 mL/min — AB (ref >60)
GLUCOSE: 202 mg/dL — AB (ref 65–99)
POTASSIUM: 3.8 mmol/L (ref 3.5–5.1)
SODIUM: 139 mmol/L (ref 135–145)
Total Bilirubin: 0.5 mg/dL (ref 0.3–1.2)
Total Protein: 6.1 g/dL — ABNORMAL LOW (ref 6.5–8.1)

## 2015-11-19 LAB — CULTURE, BLOOD (ROUTINE X 2)
CULTURE: NO GROWTH
Culture: NO GROWTH

## 2015-11-19 LAB — RENAL FUNCTION PANEL
ANION GAP: 13 (ref 5–15)
Albumin: 2.8 g/dL — ABNORMAL LOW (ref 3.5–5.0)
BUN: 27 mg/dL — ABNORMAL HIGH (ref 6–20)
CHLORIDE: 99 mmol/L — AB (ref 101–111)
CO2: 25 mmol/L (ref 22–32)
Calcium: 8.3 mg/dL — ABNORMAL LOW (ref 8.9–10.3)
Creatinine, Ser: 4.78 mg/dL — ABNORMAL HIGH (ref 0.44–1.00)
GFR, EST AFRICAN AMERICAN: 10 mL/min — AB (ref >60)
GFR, EST NON AFRICAN AMERICAN: 9 mL/min — AB (ref >60)
Glucose, Bld: 162 mg/dL — ABNORMAL HIGH (ref 65–99)
POTASSIUM: 4 mmol/L (ref 3.5–5.1)
Phosphorus: 4.1 mg/dL (ref 2.5–4.6)
Sodium: 137 mmol/L (ref 135–145)

## 2015-11-19 LAB — CBC
HCT: 35 % — ABNORMAL LOW (ref 36.0–46.0)
Hemoglobin: 10.2 g/dL — ABNORMAL LOW (ref 12.0–15.0)
MCH: 23.9 pg — AB (ref 26.0–34.0)
MCHC: 29.1 g/dL — ABNORMAL LOW (ref 30.0–36.0)
MCV: 82 fL (ref 78.0–100.0)
PLATELETS: 186 10*3/uL (ref 150–400)
RBC: 4.27 MIL/uL (ref 3.87–5.11)
RDW: 21.7 % — ABNORMAL HIGH (ref 11.5–15.5)
WBC: 10.7 10*3/uL — AB (ref 4.0–10.5)

## 2015-11-19 LAB — GLUCOSE, CAPILLARY
Glucose-Capillary: 76 mg/dL (ref 65–99)
Glucose-Capillary: 170 mg/dL — ABNORMAL HIGH (ref 65–99)
Glucose-Capillary: 273 mg/dL — ABNORMAL HIGH (ref 65–99)
Glucose-Capillary: 123 mg/dL — ABNORMAL HIGH (ref 65–99)

## 2015-11-19 MED ORDER — DOXERCALCIFEROL 4 MCG/2ML IV SOLN
INTRAVENOUS | Status: AC
  Administered 2015-11-19: 8 ug via INTRAVENOUS
  Filled 2015-11-19: qty 4

## 2015-11-19 MED ORDER — LIDOCAINE-PRILOCAINE 2.5-2.5 % EX CREA
1.0000 "application " | TOPICAL_CREAM | CUTANEOUS | Status: DC | PRN
  Filled 2015-11-19: qty 5

## 2015-11-19 MED ORDER — LIDOCAINE HCL (PF) 1 % IJ SOLN
5.0000 mL | INTRAMUSCULAR | Status: DC | PRN
  Filled 2015-11-19: qty 5

## 2015-11-19 MED ORDER — PENTAFLUOROPROP-TETRAFLUOROETH EX AERO: 1.0000 "application " | INHALATION_SPRAY | CUTANEOUS | Status: DC | PRN

## 2015-11-19 MED ORDER — HEPARIN SODIUM (PORCINE) 1000 UNIT/ML DIALYSIS
1000.0000 [IU] | INTRAMUSCULAR | Status: DC | PRN
  Filled 2015-11-19: qty 1

## 2015-11-19 MED ORDER — SODIUM CHLORIDE 0.9 % IV SOLN: 100.0000 mL | INTRAVENOUS | Status: DC | PRN

## 2015-11-19 MED ORDER — ALTEPLASE 2 MG IJ SOLR
2.0000 mg | Freq: Once | INTRAMUSCULAR | Status: DC | PRN
  Filled 2015-11-19: qty 2

## 2015-11-19 NOTE — Progress Notes (Signed)
PROGRESS NOTE  Jane Higgins:[id number] DOB: 1956-07-15 DOA: 11/13/2015 PCP: No primary care provider on file.   HPI: 60 year old female with end-stage renal disease, diabetes, hypertension, bipolar disorder, is being brought to the hospital by her son as patient has refused to have dialysis done for the last couple weeks and she has been progressively more altered.   Subjective / 24 H Interval events - seen in HD, no complaints. Very irritable today. Agrees with rehab.  Assessment/Plan: Principal Problem:   Uremic encephalopathy Active Problems:   Hyperglycemia   Schizophrenia (HCC)   DM2 (diabetes mellitus, type 2) (HCC)   Anemia   ESRD needing dialysis (HCC)   SIRS (systemic inflammatory response syndrome) (HCC)   Convulsions (HCC)   Altered level of consciousness   Palliative care encounter   DNR (do not resuscitate)   Weakness generalized   Uremic encephalopathy in the setting of ESRD - Shortly after admission, patient's mental status significantly deteriorated requiring ICU admission and intubation with mechanical ventilation. She was successfully extubated on 2/9, transferred to the [facility] service on 2/10 on room air. - Nephrology was consulted and are following, patient underwent hemodialysis with improvement in her mental status.  - I do not think however that she has capacity to make medical decisions, as she cannot tell me the reason why she is in the hospital, she denies refusing hemodialysis and has no recollection of recent events. She cannot tell me the consequences of her actions in terms of medical outcome - very deconditioned, PT recommending SNF, discussed with SW today   Goals of care - Palliative care has been consulted and are following, ongoing discussions regarding patient's wishes versus family wishes. Family feels like patient has underlying bipolar disorder and cannot make significant medical decisions for herself, they acknowledged the fact that she  doesn't want dialysis however there views are different. Appreciate palliative care input, discussed with Lorinda Creed, NP 2/10 and 2/11  SIRS/ Leukocytosis - Suspected? Aspiration, she was on Unasyn 2/08 >> 2/09 and Vancomycin / Zosyn 2/5 >> 2/7 - Monitor off antibiotics, chest x-ray clear, she is on room air, leukocytosis improving cultures have remained negative. In total she has received 5 days of IV antibiotics - afebrile  Acute hypoxic respiratory failure / ventilator dependent respiratory failure - Extubated on 2/9, chest x-ray clear  - on room air now  Diabetes - Home regimen includes Novolin 70/30. - Continue sliding scale, CBG is controlled   Schizophrenia/bipolar - Patient has been prescribed medications but does not take.   Possible seizures - Neurology was consulted, appreciate input, continue Keppra at 500 mg daily with additional 250 mg post-each hemodialysis  Anemia of chronic disease - Hemoglobin stable, no bleeding  Hypokalemia - Intermittent, replete as needed, per HD   Diet: DIET DYS 2 Room service appropriate?: Yes; Fluid consistency:: Thin Fluids: none  DVT Prophylaxis: heparin  Code Status: DNR Family Communication: no family bedside Disposition Plan: home vs SNF when ready   Barriers to discharge: Ongoing GOC discussions   Consultants:  Nephrology   PCCM  Neurology  Palliative   Procedures:  EEG  Intubation / extubated 2/9   Antibiotics Unasyn 2/08 >> 2/09 Vancomycin / Zosyn 2/5 >> 2/7   Studies  No results found.  Objective  Filed Vitals:   11/18/15 2329 11/19/15 0639 11/19/15 0645 11/19/15 0700  BP: 128/68 169/89 163/90 159/91  Pulse: 101 90 88 84  Temp:  98.5 F (36.9 C)    TempSrc:  Oral    Resp:  14    Height:      Weight:  51.8 kg (114 lb 3.2 oz)    SpO2:  100%      Intake/Output Summary (Last 24 hours) at 11/19/15 0733 Last data filed at 11/18/15 1847  Gross per 24 hour  Intake    250 ml  Output      0 ml    Net    250 ml   Filed Weights   11/18/15 0600 11/18/15 2325 11/19/15 0639  Weight: 50.168 kg (110 lb 9.6 oz) 51.846 kg (114 lb 4.8 oz) 51.8 kg (114 lb 3.2 oz)   Exam:  GENERAL: NAD  HEENT: no scleral icterus, PERRL  NECK: supple, no LAD  LUNGS: CTA biL, no wheezing  HEART: RRR without MRG  ABDOMEN: soft, non tender  EXTREMITIES: no clubbing / cyanosis  NEUROLOGIC: non focal  Data Reviewed: Basic Metabolic Panel:  Recent Labs Lab 11/14/15 0908 11/14/15 1032  11/14/15 1915  11/15/15 0249 11/15/15 1052 11/15/15 2130 11/16/15 0250 11/17/15 0238 11/18/15 0549 11/19/15 0659  NA  --   --   < >  --   < > 138  --   --  142 141 140 137  K  --   --   < >  --   < > 3.4*  --   --  2.9* 3.5 3.2* 4.0  CL  --   --   < >  --   < > 100*  --   --  103 106 99* 99*  CO2  --   --   < >  --   < > 21*  --   --  GLUCOSE  --   --   < >  --   < > 67  --   --  120* 143* 201* 162*  BUN  --   --   < >  --   < > 25*  --   --  27* 38* 14 27*  CREATININE  --   --   < >  --   < > 4.29*  --   --  4.32* 5.87* 3.54* 4.78*  CALCIUM  --   --   < >  --   < > 7.2*  --   --  7.7* 7.8* 8.3* 8.3*  MG 1.8 1.8  --  1.9  --   --  1.7 1.7  --   --   --   --   PHOS 9.8* 10.3*  < > 11.9*  < > 3.9 4.3 2.9 2.9 3.7 2.7 4.1  < > = values in this interval not displayed. Liver Function Tests:  Recent Labs Lab 11/13/15 1240 11/14/15 0024  11/15/15 0249 11/16/15 0250 11/17/15 0238 11/18/15 0549 11/19/15 0659  AST 10* 14*  --   --   --   --   --   --   ALT 9* 12*  --   --   --   --   --   --   ALKPHOS 86 92  --   --   --   --   --   --   BILITOT 1.0 1.0  --   --   --   --   --   --   PROT 6.7 7.0  --   --   --   --   --   --   ALBUMIN  3.5 3.6  < > 2.5* 2.7* 2.6* 2.7* 2.8*  < > = values in this interval not displayed. No results for input(s): LIPASE, AMYLASE in the last 168 hours.  Recent Labs Lab 11/13/15 1240  AMMONIA 95*   CBC:  Recent Labs Lab 11/13/15 1240  11/14/15 2002  11/15/15 0249 11/16/15 0250 11/17/15 0238 11/18/15 0549  WBC 14.0*  < > 15.6* 14.9* 13.3* 13.8* 11.8*  NEUTROABS 12.7*  --   --   --   --   --   --   HGB 9.0*  < > 8.8* 8.8* 8.2* 8.1* 9.8*  HCT 29.4*  < > 29.2* 29.5* 27.5* 27.6* 35.5*  MCV 77.6*  < > 76.0* 76.2* 76.4* 79.3 81.6  PLT 309  < > 332 209 235 190 194  < > = values in this interval not displayed. Cardiac Enzymes:  Recent Labs Lab 11/13/15 2140 11/14/15 0024 11/14/15 0908 11/14/15 1032 11/14/15 1828  TROPONINI 0.91* 1.03* 0.89* 0.85* 0.64*   CBG:  Recent Labs Lab 11/18/15 1207 11/18/15 1618 11/18/15 2108 11/18/15 2323 11/19/15 0330  GLUCAP 137* 172* 245* 184* 76    Recent Results (from the past 240 hour(s))  Culture, blood (routine x 2)     Status: None   Collection Time: 11/13/15  3:09 PM  Result Value Ref Range Status   Specimen Description BLOOD RIGHT ARM  Final   Special Requests BOTTLES DRAWN AEROBIC AND ANAEROBIC 5CC  Final   Culture NO GROWTH 5 DAYS  Final   Report Status 11/18/2015 FINAL  Final  Culture, blood (routine x 2)     Status: None   Collection Time: 11/13/15  3:59 PM  Result Value Ref Range Status   Specimen Description BLOOD RIGHT HAND  Final   Special Requests BOTTLES DRAWN AEROBIC AND ANAEROBIC 5CC  Final   Culture NO GROWTH 5 DAYS  Final   Report Status 11/18/2015 FINAL  Final  MRSA PCR Screening     Status: None   Collection Time: 11/13/15  9:09 PM  Result Value Ref Range Status   MRSA by PCR NEGATIVE NEGATIVE Final    Comment:        The GeneXpert MRSA Assay (FDA approved for NASAL specimens only), is one component of a comprehensive MRSA colonization surveillance program. It is not intended to diagnose MRSA infection nor to guide or monitor treatment for MRSA infections.   Culture, blood (Routine X 2) w Reflex to ID Panel     Status: None (Preliminary result)   Collection Time: 11/14/15 12:24 AM  Result Value Ref Range Status   Specimen Description BLOOD LEFT  ANTECUBITAL  Final   Special Requests IN PEDIATRIC BOTTLE 3CC  Final   Culture NO GROWTH 4 DAYS  Final   Report Status PENDING  Incomplete  Culture, blood (Routine X 2) w Reflex to ID Panel     Status: None (Preliminary result)   Collection Time: 11/14/15 12:40 AM  Result Value Ref Range Status   Specimen Description BLOOD LEFT HAND  Final   Special Requests IN PEDIATRIC BOTTLE 2CC  Final   Culture NO GROWTH 4 DAYS  Final   Report Status PENDING  Incomplete     Scheduled Meds: . calcium acetate  2,001 mg Oral TID WC  . darbepoetin (ARANESP) injection - DIALYSIS  200 mcg Intravenous Q Thu-HD  . doxercalciferol  8 mcg Intravenous Q T,Th,Sa-HD  . feeding supplement (NEPRO CARB STEADY)  237 mL Oral BID BM  .  heparin  5,000 Units Subcutaneous 3 times per day  . hydrALAZINE  10 mg Intravenous Once  . insulin aspart  0-9 Units Subcutaneous 6 times per day  . lactulose  30 g Oral TID  . levETIRAcetam  500 mg Oral Q24H   And  . levETIRAcetam  250 mg Oral Once per day on Mon Wed Fri  . sodium chloride flush  3 mL Intravenous Q12H   Continuous Infusions:    Pamella Pert, MD [facility] Pager [phone]. If 7 PM - 7 AM, please contact night-coverage at [email or web address], password [facility] 11/19/2015, 7:33 AM  LOS: 6 days

## 2015-11-19 NOTE — Progress Notes (Addendum)
Daily Progress Note   Patient Name: Jane Higgins       Date: 11/19/2015 DOB: [birth year]  Age: 60 y.o. MRN#: [id number] Attending Physician: Leatha Gilding, MD Primary Care Physician: No primary care provider on file. Admit Date: 11/13/2015  Reason for Consultation/Follow-up: Establishing goals of care and Psychosocial/spiritual support  Subjective: - continued conversation with the patient's family to include daughters Delcia Spitzley # [phone], Neta Mends # [phone], Son/[name] # [phone], and Dr Arlean Hopping  to discuss diagnosis, prognosis, concept of dialysis as a life prolonging  treatment options,   A discussion was had today regarding specifically dialysis was had.  The difference between a aggressive medical intervention path  and a palliative comfort care path for this patient at this time was had.  Values and goals of care important to patient and family were attempted to be elicited.  Family present today worry that patient will not be compliant with medical interventions for very long into the future.  However their goal for now is to access a SNF for rehabilitation with continued dialysis treatments.      Family agree with Dr Malena Catholic recommendation that if patient refuses dialysis in the future ( she has only been on dialysis since August 2016 and has had three separate incidents of stopping treatment for weeks at a time) that will be  the time to shift to a comfort path and include hospice for EOL care.   Concept of Hospice and Palliative Care were discussed    EOL trajectory and expectations without dialysis discussed  Questions and concerns addressed.  Family encouraged to call with questions or concerns.  PMT will continue to support holistically.  This is a  difficult situation for this supportive family, emotional support offered.   Length of Stay: 6 days  Current Medications: Scheduled Meds:  . calcium acetate  2,001 mg Oral TID WC  . darbepoetin (ARANESP) injection - DIALYSIS  200 mcg Intravenous Q Thu-HD  . doxercalciferol  8 mcg Intravenous Q T,Th,Sa-HD  . feeding supplement (NEPRO CARB STEADY)  237 mL Oral BID BM  . heparin  5,000 Units Subcutaneous 3 times per day  . hydrALAZINE  10 mg Intravenous Once  . insulin aspart  0-9 Units Subcutaneous 6 times per day  . lactulose  30 g Oral TID  .  levETIRAcetam  500 mg Oral Q24H   And  . levETIRAcetam  250 mg Oral Once per day on Mon Wed Fri  . sodium chloride flush  3 mL Intravenous Q12H    Continuous Infusions:    PRN Meds: sodium chloride, acetaminophen **OR** acetaminophen, hydrALAZINE, LORazepam, morphine injection, ondansetron **OR** ondansetron (ZOFRAN) IV, senna-docusate, sodium chloride flush  Physical Exam: Physical Exam  Constitutional: She is oriented to person, place, and time. She appears cachectic. She is cooperative. She appears ill.  Cardiovascular: Tachycardia present.   Musculoskeletal:       Right shoulder: She exhibits decreased strength.  Neurological: She is alert and oriented to person, place, and time.  Skin: Skin is warm and dry.  Psychiatric: Cognition and memory are impaired. She expresses inappropriate judgment. She exhibits abnormal recent memory.                Vital Signs: BP 168/74 mmHg  Pulse 89  Temp(Src) 98.9 F (37.2 C) (Oral)  Resp 19  Ht  (1.651 m)  Wt 50.8 kg (111 lb 15.9 oz)  BMI 18.64 kg/m2  SpO2 98% SpO2: SpO2: 98 % O2 Device: O2 Device: Not Delivered O2 Flow Rate: O2 Flow Rate (L/min): 1 L/min  Intake/output summary:   Intake/Output Summary (Last 24 hours) at 11/19/15 1438 Last data filed at 11/19/15 1016  Gross per 24 hour  Intake    250 ml  Output   1000 ml  Net   -750 ml   LBM: Last BM Date: 11/19/15 Baseline  Weight: Weight: 54 kg (119 lb 0.8 oz) Most recent weight: Weight: 50.8 kg (111 lb 15.9 oz)       Palliative Assessment/Data: Flowsheet Rows        Most Recent Value   Intake Tab    Referral Department  Critical care   Unit at Time of Referral  ICU   Palliative Care Primary Diagnosis  Nephrology   Date Notified  11/13/15   Palliative Care Type  New Palliative care   Reason for referral  Clarify Goals of Care   Date of Admission  11/13/15   Date first seen by Palliative Care  11/14/15   # of days Palliative referral response time  1 Day(s)   # of days IP prior to Palliative referral  0   Clinical Assessment    Palliative Performance Scale Score  10%   Pain Max last 24 hours  Not able to report   Pain Min Last 24 hours  Not able to report   Psychosocial & Spiritual Assessment    Palliative Care Outcomes    Patient/Family meeting held?  Yes   Who was at the meeting?  patients children, siblings   Palliative Care Outcomes  Clarified goals of care, Changed CPR status   Patient/Family wishes: Interventions discontinued/not started   Vasopressors      Additional Data Reviewed: CBC    Component Value Date/Time   WBC 10.7* 11/19/2015 1234   RBC 4.27 11/19/2015 1234   RBC 2.57* 05/28/2015 2203   HGB 10.2* 11/19/2015 1234   HCT 35.0* 11/19/2015 1234   PLT 186 11/19/2015 1234   MCV 82.0 11/19/2015 1234   MCH 23.9* 11/19/2015 1234   MCHC 29.1* 11/19/2015 1234   RDW 21.7* 11/19/2015 1234   LYMPHSABS 0.7 11/13/2015 1240   MONOABS 0.6 11/13/2015 1240   EOSABS 0.0 11/13/2015 1240   BASOSABS 0.0 11/13/2015 1240    CMP     Component Value Date/Time  NA 139 11/19/2015 1234   K 3.8 11/19/2015 1234   CL 99* 11/19/2015 1234   CO2 30 11/19/2015 1234   GLUCOSE 202* 11/19/2015 1234   BUN 7 11/19/2015 1234   CREATININE 2.19* 11/19/2015 1234   CREATININE 1.02 06/28/2007 1510   CALCIUM 8.1* 11/19/2015 1234   PROT 6.1* 11/19/2015 1234   ALBUMIN 2.9* 11/19/2015 1234   AST 27  11/19/2015 1234   ALT 22 11/19/2015 1234   ALKPHOS 81 11/19/2015 1234   BILITOT 0.5 11/19/2015 1234   GFRNONAA 23* 11/19/2015 1234   GFRAA 27* 11/19/2015 1234       Problem List:  Patient Active Problem List   Diagnosis Date Noted  . DNR (do not resuscitate) 11/18/2015  . Palliative care encounter   . Weakness generalized   . Convulsions (HCC)   . Altered level of consciousness   . 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 SIRS (systemic inflammatory response syndrome) (HCC) 11/13/2015  . Uremia 10/04/2015  . Acute encephalopathy 10/04/2015  . Tobacco abuse 10/04/2015  . Dyspnea 10/04/2015  . Malnutrition of moderate degree (HCC) 05/30/2015  . ESRD needing dialysis (HCC)   . ARF (acute renal failure) (HCC) 05/28/2015  . Renal failure 05/28/2015  . Hypokalemia 05/28/2015  . Weight loss 05/28/2015  . DM2 (diabetes mellitus, type 2) (HCC) 05/28/2015  . Anemia 05/28/2015  . Schizophrenia (HCC) 04/16/2013  . Acute respiratory failure (HCC) 04/13/2013  . Generalized seizures (HCC) 04/12/2013  . Hypertensive urgency 04/12/2013  . Hyperglycemia 04/12/2013     Palliative Care Assessment & Plan    1.Code Status:  DNR    Code Status Orders        Start     Ordered   11/15/15 1011  Do not attempt resuscitation (DNR)   Continuous    Question Answer Comment  In the event of cardiac or respiratory ARREST Do not call a "code blue"   In the event of cardiac or respiratory ARREST Do not perform Intubation, CPR, defibrillation or ACLS   In the event of cardiac or respiratory ARREST Use medication by any route, position, wound care, and other measures to relive pain and suffering. May use oxygen, suction and manual treatment of airway obstruction as needed for comfort.      11/15/15 1010    Code Status History    Date Active Date Inactive Code Status Order ID Comments User Context   11/13/2015  3:07 PM 11/15/2015 10:10 AM Full Code [id number]  Gwenyth Bender, NP ED   10/05/2015 12:05  AM 10/09/2015 10:00 PM Full Code [id number]  Edsel Petrin, DO Inpatient   05/28/2015  9:10 PM 06/05/2015  9:17 PM Full Code [id number]  Inez Catalina, MD Inpatient       2. Goals of Care/Additional Recommendations:  Continue dialysis   Transition to SNF for rehabilitation on discharge   Psycho-social Needs: Education on Hospice  3. Symptom Management:      1.  Weakness: PT evaluation and treatment Mental Health: family strongly encouraged to seek OP psychiatric care if indeed this is something they and the patient want to pursue   4. Palliative Prophylaxis:   Aspiration, Bowel Regimen, Delirium Protocol, Frequent Pain Assessment and Oral Care  5. Prognosis: Unable to determine, dependant on desire/compliance with  life prolonging meausres  6. Discharge Planning:  Skilled Nursing Facility for rehab with Palliative care service follow-up  Discussed with Dr Arlean Hopping and Dr Elvera Lennox   Thank you for allowing the Palliative Medicine Team to  assist in the care of this patient.   Time In:  1400 Time Out: 1435 Total Time 35 min Prolonged Time Billed  no         Canary Brim, NP  11/19/2015, 2:38 PM  Please contact Palliative Medicine Team phone at [phone] for questions and concerns.

## 2015-11-19 NOTE — Progress Notes (Signed)
[facility] Progress Note  Assessment/Plan: 1. AMS: Resolved. Missed HD 2 weeks.  2. ESRD - TTS HD K 4 - We were using AVF prior to admission - with 17 gauge needles and Qb 250s -We need to use next treatment  3. Anemia - HGB 9.8 Aranesp 200 q thurs 4. Secondary hyperparathyroidism - Ca/P ok cont hectoral/phoslo  5. HTN/volume - 6. Nutrition - Albumin 2.6 on D 2 diet per ST - added fluid restriction 7. Bipolar disorder: per primary 8. EOL - Pt DNR. Very poor dialysis compliance; Case manager note seen - she is NOT a home dialysis candidate; primary believes pt does not have competency. She is home alone about 6 hr a day and refuses HH or SCAT transportation during these times.  Appreciate palliative care assistance.   Sheffield Slider, PA-C [facility] Beeper [phone] 11/19/2015,8:50 AM  LOS: 6 days   Pt seen, examined and agree w A/P as above. For family meeting today.  Vinson Moselle MD [facility] pager [phone]    cell [phone] 11/19/2015, 12:00 PM     Subjective:   No c/o  Objective Filed Vitals:   11/19/15 0700 11/19/15 0730 11/19/15 0800 11/19/15 0830  BP: 159/91 153/83 144/73 152/85  Pulse: 84 85 85 93  Temp:      TempSrc:      Resp:      Height:      Weight:      SpO2:       Physical Exam goal 1.5 General: pleasant, NAD on HD Heart: RRR Lungs: no rlaes Abdomen: soft active BS Extremities: no edema Dialysis Access: right IJ and mature right upper AVF  + bruit/thrill  Dialysis Orders: [facility] 4h 2/3.5 bath 400/800 52kg Heparin none R IJ cath (RUA AVF 8/26) MIrcera 225 q 2wks Hect 8 ug tiw  Additional Objective Labs: Basic Metabolic Panel:  Recent Labs Lab 11/17/15 0238 11/18/15 0549 11/19/15 0659  NA 141 140 137  K 3.5 3.2* 4.0  CL 106 99* 99*  CO2 GLUCOSE 143* 201* 162*  BUN 38* 14 27*  CREATININE 5.87* 3.54* 4.78*  CALCIUM 7.8* 8.3* 8.3*  PHOS 3.7 2.7 4.1   Liver  Function Tests:  Recent Labs Lab 11/13/15 1240 11/14/15 0024  11/17/15 0238 11/18/15 0549 11/19/15 0659  AST 10* 14*  --   --   --   --   ALT 9* 12*  --   --   --   --   ALKPHOS 86 92  --   --   --   --   BILITOT 1.0 1.0  --   --   --   --   PROT 6.7 7.0  --   --   --   --   ALBUMIN 3.5 3.6  < > 2.6* 2.7* 2.8*  < > = values in this interval not displayed. No results for input(s): LIPASE, AMYLASE in the last 168 hours. CBC:  Recent Labs Lab 11/13/15 1240  11/14/15 2002 11/15/15 0249 11/16/15 0250 11/17/15 0238 11/18/15 0549  WBC 14.0*  < > 15.6* 14.9* 13.3* 13.8* 11.8*  NEUTROABS 12.7*  --   --   --   --   --   --   HGB 9.0*  < > 8.8* 8.8* 8.2* 8.1* 9.8*  HCT 29.4*  < > 29.2* 29.5* 27.5* 27.6* 35.5*  MCV 77.6*  < > 76.0* 76.2* 76.4* 79.3 81.6  PLT 309  < >  332 209 235 190 194  < > = values in this interval not displayed. Blood Culture    Component Value Date/Time   SDES BLOOD LEFT HAND 11/14/2015 0040   SPECREQUEST IN PEDIATRIC BOTTLE 2CC 11/14/2015 0040   CULT NO GROWTH 4 DAYS 11/14/2015 0040   REPTSTATUS PENDING 11/14/2015 0040    Cardiac Enzymes:  Recent Labs Lab 11/13/15 2140 11/14/15 0024 11/14/15 0908 11/14/15 1032 11/14/15 1828  TROPONINI 0.91* 1.03* 0.89* 0.85* 0.64*   CBG:  Recent Labs Lab 11/18/15 1207 11/18/15 1618 11/18/15 2108 11/18/15 2323 11/19/15 0330  GLUCAP 137* 172* 245* 184* 76  Medications:   . calcium acetate  2,001 mg Oral TID WC  . darbepoetin (ARANESP) injection - DIALYSIS  200 mcg Intravenous Q Thu-HD  . doxercalciferol  8 mcg Intravenous Q T,Th,Sa-HD  . feeding supplement (NEPRO CARB STEADY)  237 mL Oral BID BM  . heparin  5,000 Units Subcutaneous 3 times per day  . hydrALAZINE  10 mg Intravenous Once  . insulin aspart  0-9 Units Subcutaneous 6 times per day  . lactulose  30 g Oral TID  . levETIRAcetam  500 mg Oral Q24H   And  . levETIRAcetam  250 mg Oral Once per day on Mon Wed Fri  . sodium chloride flush  3 mL  Intravenous Q12H

## 2015-11-20 LAB — GLUCOSE, CAPILLARY
GLUCOSE-CAPILLARY: 129 mg/dL — AB (ref 65–99)
GLUCOSE-CAPILLARY: 235 mg/dL — AB (ref 65–99)
Glucose-Capillary: 111 mg/dL — ABNORMAL HIGH (ref 65–99)
Glucose-Capillary: 127 mg/dL — ABNORMAL HIGH (ref 65–99)
Glucose-Capillary: 193 mg/dL — ABNORMAL HIGH (ref 65–99)

## 2015-11-20 NOTE — Progress Notes (Signed)
[facility] Progress Note  Assessment: 1. AMS: improved, though still confused at night. Missed HD 2 weeks. This is the 3rd time she has done this since starting HD in Aug 2016. Admitted severely uremic and improved with HD.   2. ESRD - TTS HD K 4 - We were using AVF prior to admission - with 17 gauge needles and Qb 250s -We need to use next treatment  3. Anemia - HGB 9.8 Aranesp 200 q thurs 4. Secondary hyperparathyroidism - Ca/P ok cont hectoral/phoslo  5. HTN/volume - 6. Nutrition - Albumin 2.6 on D 2 diet per ST - added fluid restriction 7. Bipolar disorder: per primary. Long hx > 20 yrs, patient stopped her medications about 10 yrs ago, refused to take them.   8. EOL - Pt DNR. Very poor dialysis compliance. Patient does not have capacity.  Met w family yesterday; we recommended and family has agreed that if patient stops going to dialysis again that they will move forward with transition to hospice/ comfort care/ withdrawal of dialysis.   Plan - HD Tuesday if still here. Possible SNFP.    Kelly Splinter MD [facility] pager [phone]    cell [phone] 11/20/2015, 9:04 AM     Subjective:   No c/o  Objective Filed Vitals:   11/19/15 1016 11/19/15 1307 11/19/15 2026 11/20/15 0408  BP: 163/85 168/74 155/68 171/90  Pulse: 94 89 99 99  Temp: 98.4 F (36.9 C) 98.9 F (37.2 C) 98.6 F (37 C) 99.4 F (37.4 C)  TempSrc: Oral Oral Oral Oral  Resp: '18 19 18 16  '$ Height:      Weight: 50.8 kg (111 lb 15.9 oz)   51.2 kg (112 lb 14 oz)  SpO2: 100% 98% 95% 96%   Physical Exam goal 1.5 General: pleasant, NAD on HD Heart: RRR Lungs: no rlaes Abdomen: soft active BS Extremities: no edema Dialysis Access: right IJ and mature right upper AVF  + bruit/thrill  Dialysis Orders: [facility] 4h 2/3.5 bath 400/800 52kg Heparin none R IJ cath (RUA AVF 8/26) MIrcera 225 q 2wks Hect 8 ug tiw  Additional Objective Labs: Basic Metabolic Panel:  Recent  Labs Lab 11/17/15 0238 11/18/15 0549 11/19/15 0659 11/19/15 1234  NA 141 140 137 139  K 3.5 3.2* 4.0 3.8  CL 106 99* 99* 99*  CO2 '23 26 25 30  '$ GLUCOSE 143* 201* 162* 202*  BUN 38* 14 27* 7  CREATININE 5.87* 3.54* 4.78* 2.19*  CALCIUM 7.8* 8.3* 8.3* 8.1*  PHOS 3.7 2.7 4.1  --    Liver Function Tests:  Recent Labs Lab 11/13/15 1240 11/14/15 0024  11/18/15 0549 11/19/15 0659 11/19/15 1234  AST 10* 14*  --   --   --  27  ALT 9* 12*  --   --   --  22  ALKPHOS 86 92  --   --   --  81  BILITOT 1.0 1.0  --   --   --  0.5  PROT 6.7 7.0  --   --   --  6.1*  ALBUMIN 3.5 3.6  < > 2.7* 2.8* 2.9*  < > = values in this interval not displayed. No results for input(s): LIPASE, AMYLASE in the last 168 hours. CBC:  Recent Labs Lab 11/13/15 1240  11/15/15 0249 11/16/15 0250 11/17/15 0238 11/18/15 0549 11/19/15 1234  WBC 14.0*  < > 14.9* 13.3* 13.8* 11.8* 10.7*  NEUTROABS 12.7*  --   --   --   --   --   --  HGB 9.0*  < > 8.8* 8.2* 8.1* 9.8* 10.2*  HCT 29.4*  < > 29.5* 27.5* 27.6* 35.5* 35.0*  MCV 77.6*  < > 76.2* 76.4* 79.3 81.6 82.0  PLT 309  < > 209 235 190 194 186  < > = values in this interval not displayed. Blood Culture    Component Value Date/Time   SDES BLOOD LEFT HAND 11/14/2015 0040   SPECREQUEST IN PEDIATRIC BOTTLE 2CC 11/14/2015 0040   CULT NO GROWTH 5 DAYS 11/14/2015 0040   REPTSTATUS 11/19/2015 FINAL 11/14/2015 0040    Cardiac Enzymes:  Recent Labs Lab 11/13/15 2140 11/14/15 0024 11/14/15 0908 11/14/15 1032 11/14/15 1828  TROPONINI 0.91* 1.03* 0.89* 0.85* 0.64*   CBG:  Recent Labs Lab 11/19/15 1049 11/19/15 1635 11/19/15 2023 11/20/15 0005 11/20/15 0403  GLUCAP 170* 273* 123* 111* 127*  Medications:   . calcium acetate  2,001 mg Oral TID WC  . darbepoetin (ARANESP) injection - DIALYSIS  200 mcg Intravenous Q Thu-HD  . doxercalciferol  8 mcg Intravenous Q T,Th,Sa-HD  . feeding supplement (NEPRO CARB STEADY)  237 mL Oral BID BM  . heparin   5,000 Units Subcutaneous 3 times per day  . hydrALAZINE  10 mg Intravenous Once  . insulin aspart  0-9 Units Subcutaneous 6 times per day  . lactulose  30 g Oral TID  . levETIRAcetam  500 mg Oral Q24H   And  . levETIRAcetam  250 mg Oral Once per day on Mon Wed Fri  . sodium chloride flush  3 mL Intravenous Q12H

## 2015-11-20 NOTE — Progress Notes (Signed)
PROGRESS NOTE  Jane Higgins:[id number] DOB: [birth year] DOA: 11/13/2015 PCP: No primary care provider on file.   HPI: 60 year old female with end-stage renal disease, diabetes, hypertension, bipolar disorder, is being brought to the hospital by her son as patient has refused to have dialysis done for the last couple weeks and she has been progressively more altered.   Subjective / 24 H Interval events - calmer today, has no complaints  Assessment/Plan: Principal Problem:   Uremic encephalopathy Active Problems:   Hyperglycemia   Schizophrenia (HCC)   DM2 (diabetes mellitus, type 2) (HCC)   Anemia   ESRD needing dialysis (HCC)   SIRS (systemic inflammatory response syndrome) (HCC)   Convulsions (HCC)   Altered level of consciousness   Palliative care encounter   DNR (do not resuscitate)   Weakness generalized   Uremic encephalopathy in the setting of ESRD - Shortly after admission, patient's mental status significantly deteriorated requiring ICU admission and intubation with mechanical ventilation. She was successfully extubated on 2/9, transferred to the [facility] service on 2/10 on room air. - Nephrology was consulted and are following, patient underwent hemodialysis with improvement in her mental status.  - I do not think however that she has capacity to make medical decisions, as she cannot tell me the reason why she is in the hospital, she denies refusing hemodialysis and has no recollection of recent events. She cannot tell me the consequences of her actions in terms of medical outcome - very deconditioned, PT recommending SNF, discussed with SW this morning, placement pending  Goals of care - Palliative care has been consulted and are following, ongoing discussions regarding patient's wishes versus family wishes. Family feels like patient has underlying bipolar disorder and cannot make significant medical decisions for herself, they acknowledged the fact that she doesn't  want dialysis however there views are different. Appreciate palliative care input, discussed with Lorinda Creed, NP 2/10 and 2/11  SIRS/ Leukocytosis - Suspected? Aspiration, she was on Unasyn 2/08 >> 2/09 and Vancomycin / Zosyn 2/5 >> 2/7 - Monitor off antibiotics, chest x-ray clear, she is on room air, leukocytosis improving cultures have remained negative. In total she has received 5 days of IV antibiotics - afebrile  Acute hypoxic respiratory failure / ventilator dependent respiratory failure - Extubated on 2/9, chest x-ray clear  - on room air now  Diabetes - Home regimen includes Novolin 70/30. - Continue sliding scale, CBG is controlled   Schizophrenia/bipolar - Patient has been prescribed medications but does not take.   Possible seizures - Neurology was consulted, appreciate input, continue Keppra at 500 mg daily with additional 250 mg post-each hemodialysis  Anemia of chronic disease - Hemoglobin stable, no bleeding  Hypokalemia - Intermittent, replete as needed, per HD   Diet: DIET DYS 2 Room service appropriate?: Yes; Fluid consistency:: Thin; Fluid restriction:: 1200 mL Fluid Fluids: none  DVT Prophylaxis: heparin  Code Status: DNR Family Communication: no family bedside Disposition Plan: home vs SNF when ready   Barriers to discharge: Ongoing GOC discussions   Consultants:  Nephrology   PCCM  Neurology  Palliative   Procedures:  EEG  Intubation / extubated 2/9   Antibiotics Unasyn 2/08 >> 2/09 Vancomycin / Zosyn 2/5 >> 2/7   Studies  No results found.  Objective  Filed Vitals:   11/19/15 1016 11/19/15 1307 11/19/15 2026 11/20/15 0408  BP: 163/85 168/74 155/68 171/90  Pulse: 94 89 99 99  Temp: 98.4 F (36.9 C) 98.9 F (37.2 C) 98.6  F (37 C) 99.4 F (37.4 C)  TempSrc: Oral Oral Oral Oral  Resp: Height:      Weight: 50.8 kg (111 lb 15.9 oz)   51.2 kg (112 lb 14 oz)  SpO2: 100% 98% 95% 96%    Intake/Output Summary  (Last 24 hours) at 11/20/15 1034 Last data filed at 11/19/15 2136  Gross per 24 hour  Intake    363 ml  Output      0 ml  Net    363 ml   Filed Weights   11/19/15 0639 11/19/15 1016 11/20/15 0408  Weight: 51.8 kg (114 lb 3.2 oz) 50.8 kg (111 lb 15.9 oz) 51.2 kg (112 lb 14 oz)   Exam:  GENERAL: NAD  HEENT: no scleral icterus, PERRL  NECK: supple, no LAD  LUNGS: CTA biL, no wheezing  HEART: RRR without MRG  ABDOMEN: soft, non tender  EXTREMITIES: no clubbing / cyanosis  NEUROLOGIC: non focal  Data Reviewed: Basic Metabolic Panel:  Recent Labs Lab 11/14/15 0908 11/14/15 1032  11/14/15 1915  11/15/15 1052 11/15/15 2130 11/16/15 0250 11/17/15 0238 11/18/15 0549 11/19/15 0659 11/19/15 1234  NA  --   --   < >  --   < >  --   --  142 141 140 137 139  K  --   --   < >  --   < >  --   --  2.9* 3.5 3.2* 4.0 3.8  CL  --   --   < >  --   < >  --   --  103 106 99* 99* 99*  CO2  --   --   < >  --   < >  --   --  GLUCOSE  --   --   < >  --   < >  --   --  120* 143* 201* 162* 202*  BUN  --   --   < >  --   < >  --   --  27* 38* 14 27* 7  CREATININE  --   --   < >  --   < >  --   --  4.32* 5.87* 3.54* 4.78* 2.19*  CALCIUM  --   --   < >  --   < >  --   --  7.7* 7.8* 8.3* 8.3* 8.1*  MG 1.8 1.8  --  1.9  --  1.7 1.7  --   --   --   --   --   PHOS 9.8* 10.3*  < > 11.9*  < > 4.3 2.9 2.9 3.7 2.7 4.1  --   < > = values in this interval not displayed. Liver Function Tests:  Recent Labs Lab 11/13/15 1240 11/14/15 0024  11/16/15 0250 11/17/15 0238 11/18/15 0549 11/19/15 0659 11/19/15 1234  AST 10* 14*  --   --   --   --   --  27  ALT 9* 12*  --   --   --   --   --  22  ALKPHOS 86 92  --   --   --   --   --  81  BILITOT 1.0 1.0  --   --   --   --   --  0.5  PROT 6.7 7.0  --   --   --   --   --  6.1*  ALBUMIN 3.5 3.6  < > 2.7* 2.6* 2.7* 2.8* 2.9*  < > = values in this interval not displayed. No results for input(s): LIPASE, AMYLASE in the last 168  hours.  Recent Labs Lab 11/13/15 1240  AMMONIA 95*   CBC:  Recent Labs Lab 11/13/15 1240  11/15/15 0249 11/16/15 0250 11/17/15 0238 11/18/15 0549 11/19/15 1234  WBC 14.0*  < > 14.9* 13.3* 13.8* 11.8* 10.7*  NEUTROABS 12.7*  --   --   --   --   --   --   HGB 9.0*  < > 8.8* 8.2* 8.1* 9.8* 10.2*  HCT 29.4*  < > 29.5* 27.5* 27.6* 35.5* 35.0*  MCV 77.6*  < > 76.2* 76.4* 79.3 81.6 82.0  PLT 309  < > 209 235 190 194 186  < > = values in this interval not displayed. Cardiac Enzymes:  Recent Labs Lab 11/13/15 2140 11/14/15 0024 11/14/15 0908 11/14/15 1032 11/14/15 1828  TROPONINI 0.91* 1.03* 0.89* 0.85* 0.64*   CBG:  Recent Labs Lab 11/19/15 1049 11/19/15 1635 11/19/15 2023 11/20/15 0005 11/20/15 0403  GLUCAP 170* 273* 123* 111* 127*    Recent Results (from the past 240 hour(s))  Culture, blood (routine x 2)     Status: None   Collection Time: 11/13/15  3:09 PM  Result Value Ref Range Status   Specimen Description BLOOD RIGHT ARM  Final   Special Requests BOTTLES DRAWN AEROBIC AND ANAEROBIC 5CC  Final   Culture NO GROWTH 5 DAYS  Final   Report Status 11/18/2015 FINAL  Final  Culture, blood (routine x 2)     Status: None   Collection Time: 11/13/15  3:59 PM  Result Value Ref Range Status   Specimen Description BLOOD RIGHT HAND  Final   Special Requests BOTTLES DRAWN AEROBIC AND ANAEROBIC 5CC  Final   Culture NO GROWTH 5 DAYS  Final   Report Status 11/18/2015 FINAL  Final  MRSA PCR Screening     Status: None   Collection Time: 11/13/15  9:09 PM  Result Value Ref Range Status   MRSA by PCR NEGATIVE NEGATIVE Final    Comment:        The GeneXpert MRSA Assay (FDA approved for NASAL specimens only), is one component of a comprehensive MRSA colonization surveillance program. It is not intended to diagnose MRSA infection nor to guide or monitor treatment for MRSA infections.   Culture, blood (Routine X 2) w Reflex to ID Panel     Status: None   Collection  Time: 11/14/15 12:24 AM  Result Value Ref Range Status   Specimen Description BLOOD LEFT ANTECUBITAL  Final   Special Requests IN PEDIATRIC BOTTLE 3CC  Final   Culture NO GROWTH 5 DAYS  Final   Report Status 11/19/2015 FINAL  Final  Culture, blood (Routine X 2) w Reflex to ID Panel     Status: None   Collection Time: 11/14/15 12:40 AM  Result Value Ref Range Status   Specimen Description BLOOD LEFT HAND  Final   Special Requests IN PEDIATRIC BOTTLE 2CC  Final   Culture NO GROWTH 5 DAYS  Final   Report Status 11/19/2015 FINAL  Final     Scheduled Meds: . calcium acetate  2,001 mg Oral TID WC  . darbepoetin (ARANESP) injection - DIALYSIS  200 mcg Intravenous Q Thu-HD  . doxercalciferol  8 mcg Intravenous Q T,Th,Sa-HD  . feeding supplement (NEPRO CARB STEADY)  237 mL Oral BID BM  .  heparin  5,000 Units Subcutaneous 3 times per day  . hydrALAZINE  10 mg Intravenous Once  . insulin aspart  0-9 Units Subcutaneous 6 times per day  . lactulose  30 g Oral TID  . levETIRAcetam  500 mg Oral Q24H   And  . levETIRAcetam  250 mg Oral Once per day on Mon Wed Fri  . sodium chloride flush  3 mL Intravenous Q12H   Continuous Infusions:    Pamella Pert, MD [facility] Pager [phone]. If 7 PM - 7 AM, please contact night-coverage at [email or web address], password [facility] 11/20/2015, 10:34 AM  LOS: 7 days

## 2015-11-21 LAB — RENAL FUNCTION PANEL
ALBUMIN: 2.8 g/dL — AB (ref 3.5–5.0)
Anion gap: 17 — ABNORMAL HIGH (ref 5–15)
BUN: 31 mg/dL — AB (ref 6–20)
CALCIUM: 8.8 mg/dL — AB (ref 8.9–10.3)
CO2: 21 mmol/L — AB (ref 22–32)
CREATININE: 4.98 mg/dL — AB (ref 0.44–1.00)
Chloride: 100 mmol/L — ABNORMAL LOW (ref 101–111)
GFR calc Af Amer: 10 mL/min — ABNORMAL LOW (ref >60)
GFR calc non Af Amer: 9 mL/min — ABNORMAL LOW (ref >60)
GLUCOSE: 151 mg/dL — AB (ref 65–99)
PHOSPHORUS: 5 mg/dL — AB (ref 2.5–4.6)
Potassium: 4.3 mmol/L (ref 3.5–5.1)
SODIUM: 138 mmol/L (ref 135–145)

## 2015-11-21 LAB — GLUCOSE, CAPILLARY
Glucose-Capillary: 118 mg/dL — ABNORMAL HIGH (ref 65–99)
Glucose-Capillary: 142 mg/dL — ABNORMAL HIGH (ref 65–99)
Glucose-Capillary: 76 mg/dL (ref 65–99)
Glucose-Capillary: 81 mg/dL (ref 65–99)

## 2015-11-21 MED ORDER — LEVETIRACETAM 250 MG PO TABS: ORAL_TABLET | ORAL | Status: AC

## 2015-11-21 MED ORDER — LEVETIRACETAM 500 MG PO TABS: 500.0000 mg | ORAL_TABLET | ORAL | Status: AC

## 2015-11-21 NOTE — NC FL2 (Signed)
[place] MEDICAID FL2 LEVEL OF CARE SCREENING TOOL     IDENTIFICATION  Patient Name: Jane Higgins Birthdate: Mar 18, 1956 Sex: female Admission Date (Current Location): 11/13/2015  [facility] and [place] Number:  Producer, television/film/video and Address:  [facility], [address], [place], [place] [address]      Provider Number: [id number]  Attending Physician Name and Address:  Leatha Gilding, MD  Relative Name and Phone Number:       Current Level of Care: Hospital Recommended Level of Care: Skilled Nursing Facility Prior Approval Number:    Date Approved/Denied:   PASRR Number:    Discharge Plan: SNF    Current Diagnoses: Patient Active Problem List   Diagnosis Date Noted  . DNR (do not resuscitate) 11/18/2015  . Palliative care encounter   . Weakness generalized   . Convulsions (HCC)   . Altered level of consciousness   . 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 SIRS (systemic inflammatory response syndrome) (HCC) 11/13/2015  . Uremia 10/04/2015  . Acute encephalopathy 10/04/2015  . Tobacco abuse 10/04/2015  . Dyspnea 10/04/2015  . Malnutrition of moderate degree (HCC) 05/30/2015  . ESRD needing dialysis (HCC)   . ARF (acute renal failure) (HCC) 05/28/2015  . Renal failure 05/28/2015  . Hypokalemia 05/28/2015  . Weight loss 05/28/2015  . DM2 (diabetes mellitus, type 2) (HCC) 05/28/2015  . Anemia 05/28/2015  . Schizophrenia (HCC) 04/16/2013  . Acute respiratory failure (HCC) 04/13/2013  . Generalized seizures (HCC) 04/12/2013  . Hypertensive urgency 04/12/2013  . Hyperglycemia 04/12/2013    Orientation RESPIRATION BLADDER Height & Weight     Self  Normal Continent Weight: 112 lb 14 oz (51.2 kg) Height:   (165.1 cm)  BEHAVIORAL SYMPTOMS/MOOD NEUROLOGICAL BOWEL NUTRITION STATUS      Continent  (see DC summary)  AMBULATORY STATUS COMMUNICATION OF NEEDS Skin   Limited Assist Verbally Normal                       Personal  Care Assistance Level of Assistance  Bathing, Dressing Bathing Assistance: Limited assistance   Dressing Assistance: Limited assistance     Functional Limitations Info             SPECIAL CARE FACTORS FREQUENCY  PT (By licensed PT), OT (By licensed OT)     PT Frequency: 5/wk OT Frequency: 5/wk            Contractures      Additional Factors Info  Code Status, Allergies Code Status Info: DNR Allergies Info: Iodine           Current Medications (11/21/2015):  This is the current hospital active medication list Current Facility-Administered Medications  Medication Dose Route Frequency Provider Last Rate Last Dose  . 0.9 %  sodium chloride infusion  250 mL Intravenous PRN Gwenyth Bender, NP   Stopped at 11/16/15 0100  . acetaminophen (TYLENOL) tablet 650 mg  650 mg Oral Q6H PRN Gwenyth Bender, NP   650 mg at 11/18/15 2143   Or  . acetaminophen (TYLENOL) suppository 650 mg  650 mg Rectal Q6H PRN Gwenyth Bender, NP      . calcium acetate (PHOSLO) capsule 2,001 mg  2,001 mg Oral TID WC Pola Corn, NP   2,001 mg at 11/20/15 1759  . Darbepoetin Alfa (ARANESP) injection 200 mcg  200 mcg Intravenous Q Thu-HD Pola Corn, NP   200 mcg at 11/17/15 1106  .  doxercalciferol (HECTOROL) injection 8 mcg  8 mcg Intravenous Q T,Th,Sa-HD Pola Corn, NP   8 mcg at 11/19/15 1009  . feeding supplement (NEPRO CARB STEADY) liquid 237 mL  237 mL Oral BID BM Leatha Gilding, MD   237 mL at 11/19/15 1711  . heparin injection 5,000 Units  5,000 Units Subcutaneous 3 times per day Gwenyth Bender, NP   5,000 Units at 11/21/15 4098  . hydrALAZINE (APRESOLINE) injection 10 mg  10 mg Intravenous Once Rolan Lipa, NP      . hydrALAZINE (APRESOLINE) injection 20 mg  20 mg Intravenous Q6H PRN Rolan Lipa, NP   20 mg at 11/21/15 0424  . insulin aspart (novoLOG) injection 0-9 Units  0-9 Units Subcutaneous 6 times per day Roslynn Amble, MD   3 Units at 11/20/15  2114  . lactulose (CHRONULAC) 10 GM/15ML solution 30 g  30 g Oral TID Kalman Shan, MD   30 g at 11/20/15 2115  . levETIRAcetam (KEPPRA) tablet 500 mg  500 mg Oral Q24H Lynita Lombard [place], RPH   500 mg at 11/20/15 1191   And  . levETIRAcetam (KEPPRA) tablet 250 mg  250 mg Oral Once per day on Mon Wed Fri Lynita Lombard [facility], RPH   250 mg at 11/18/15 1751  . LORazepam (ATIVAN) injection 2 mg  2 mg Intravenous Q4H PRN Yolande Jolly, MD   2 mg at 11/14/15 2049  . morphine 2 MG/ML injection 1 mg  1 mg Intravenous Q2H PRN Gwenyth Bender, NP      . ondansetron [facility]) tablet 4 mg  4 mg Oral Q6H PRN Gwenyth Bender, NP       Or  . ondansetron [facility]) injection 4 mg  4 mg Intravenous Q6H PRN Gwenyth Bender, NP      . senna-docusate (Senokot-S) tablet 1 tablet  1 tablet Oral QHS PRN Lesle Chris Black, NP      . sodium chloride flush (NS) 0.9 % injection 3 mL  3 mL Intravenous Q12H Gwenyth Bender, NP   3 mL at 11/20/15 2119  . sodium chloride flush (NS) 0.9 % injection 3 mL  3 mL Intravenous PRN Gwenyth Bender, NP         Discharge Medications: Please see discharge summary for a list of discharge medications.  Relevant Imaging Results:  Relevant Lab Results:   Additional Information SS#: [id number], TTS dialysis at [facility], [place], [place]

## 2015-11-21 NOTE — Progress Notes (Signed)
Physical Therapy Treatment Patient Details Name: Jane Higgins MRN: [id number] DOB: 1956-08-08 Today's Date: 11/21/2015    History of Present Illness Patient is a 60 yo female admitted 11/13/15 with AMS after missing several weeks of HD.  Patient having seizure activity 2/6 requiring intubation.  Self-extubated 11/16/15.   PMH:  ESRD on HD, DM, HTN, bipolar disorder, schizophrenia    PT Comments    Patient very agreeable to walk and wanted to continues to walk. Increased time as patient was soiled in bed upon arrival and required A to change gowns and bed linens. Patient unaware that she was soiled. RN in at end of session and encouraged to have tech walk with patient as able to increase activity endurance. Continue to recommend SNF for ongoing Physical Therapy.     Follow Up Recommendations  SNF;Supervision/Assistance - 24 hour     Equipment Recommendations  None recommended by PT    Recommendations for Other Services       Precautions / Restrictions Precautions Precautions: Fall    Mobility  Bed Mobility Overal bed mobility: Modified Independent                Transfers Overall transfer level: Needs assistance Equipment used: Rolling walker (2 wheeled)   Sit to Stand: Min guard         General transfer comment: VCs for safety and hand placement. A little unsteady with standing but no LOB  Ambulation/Gait Ambulation/Gait assistance: Min assist Ambulation Distance (Feet): 190 Feet Assistive device: Rolling walker (2 wheeled) Gait Pattern/deviations: Step-through pattern;Staggering right;Staggering left   Gait velocity interpretation: Below normal speed for age/gender General Gait Details: Patient required max cues to keep straight pathway with gait and use of RW. Tends to stay towards one side of RW vs. in the middle of RW. A to avoid obtacles in the pathway.    Stairs            Wheelchair Mobility    Modified Rankin (Stroke Patients Only)        Balance                                    Cognition Arousal/Alertness: Awake/alert Behavior During Therapy: WFL for tasks assessed/performed;Flat affect Overall Cognitive Status: No family/caregiver present to determine baseline cognitive functioning                      Exercises      General Comments        Pertinent Vitals/Pain Faces Pain Scale: No hurt    Home Living                      Prior Function            PT Goals (current goals can now be found in the care plan section) Progress towards PT goals: Progressing toward goals    Frequency  Min 3X/week    PT Plan Current plan remains appropriate    Co-evaluation             End of Session Equipment Utilized During Treatment: Gait belt Activity Tolerance: Patient tolerated treatment well Patient left: in bed;with bed alarm set     Time: 4098-1191 PT Time Calculation (min) (ACUTE ONLY): 27 min  Charges:  $Gait Training: 8-22 mins $Therapeutic Activity: 8-22 mins  G Codes:      Fredrich Birks 11/21/2015, 9:11 AM  11/21/2015 Robinette, Adline Potter PTA

## 2015-11-21 NOTE — Progress Notes (Signed)
[facility] Rounding Note Subjective:   No new issues  Objective Vital signs in last 24 hours: Filed Vitals:   11/20/15 2158 11/21/15 0408 11/21/15 0422 11/21/15 0506  BP: 160/73 169/82 179/84 139/59  Pulse: 89 89 79 91  Temp: 98.6 F (37 C) 97.7 F (36.5 C)    TempSrc: Oral Oral    Resp: 16 17    Height:      Weight:      SpO2: 97% 98%    Physical Exam: General: pleasant, NAD in room / pleasantly confused to day . Date / aware she is at [facility] Heart: RRR no mrg Lungs: cta  Abdomen: soft NT, ND active BS Extremities: no pedal  edema Dialysis Access: right IJ pc op and mature right upper AVF + bruit/thrill  Dialysis Orders: [facility] 4h 2/3.5 bath 400/800 52kg Heparin none R IJ cath (RUA AVF 8/26) MIrcera 225 q 2 wks / Hect 8 mcg q hd   Problem/Plan: 1. AMS: improved, though still confused pleasantly now to day and time of hd/. Missed HD 2 weeks. This is the 3rd time she has done this since starting HD in Aug 2016. Admitted severely uremic and improved with HD.  2. ESRD - TTS HD K 4.3  agrees to HD in am and use AVF- use 17 g as at op  3. Anemia - HGB 10.2 Aranesp 200 q thurs 4. Secondary hyperparathyroidism - Ca/P5.0  ok cont hectoral/phoslo  5. HTN/volume -169/82 and vol stable now  6. Nutrition - Albumin 2.8 on D 2 diet per ST - added fluid restriction 7. Bipolar disorder: per primary. Long hx > 20 yrs, patient stopped her medications about 10 yrs ago, refused to take them.  8. EOL - Pt DNR. Very poor dialysis compliance. Patient does not have capacity.Dr. Darrold Span w family = recommended and family has agreed that if patient stops going to dialysis again that they will move forward with transition to hospice/ comfort care/ withdrawal of dialysis.   Ernest Haber, PA-C Olivia Lopez de Gutierrez [phone] 11/21/2015,11:36 AM  LOS: 8 days    I have seen and examined this patient and agree with plan as outlined in the above note. Pleasantly confused. For HD  tomorrow assuming she will continue to agree.  I think the plan needs to be made very clear to SNF (and reiterated with family when eventually discharged to SNF) that "Family agree with Dr Barkley Bruns recommendation that if patient refuses dialysis in the future ( she has only been on dialysis since August 2016 and has had three separate incidents of stopping treatment for weeks at a time) that will be the time to shift to a comfort path and include hospice for EOL care".  Trindon Dorton B,MD 11/21/2015 1:35 PM   Labs: Basic Metabolic Panel:  Recent Labs Lab 11/18/15 0549 11/19/15 0659 11/19/15 1234 11/21/15 0539  NA 140 137 139 138  K 3.2* 4.0 3.8 4.3  CL 99* 99* 99* 100*  CO2 _0 21*  GLUCOSE 201* 162* 202* 151*  BUN 14 27* 7 31*  CREATININE 3.54* 4.78* 2.19* 4.98*  CALCIUM 8.3* 8.3* 8.1* 8.8*  PHOS 2.7 4.1  --  5.0*   Liver Function Tests:  Recent Labs Lab 11/19/15 0659 11/19/15 1234 11/21/15 0539  AST  --  27  --   ALT  --  22  --   ALKPHOS  --  81  --   BILITOT  --  0.5  --  PROT  --  6.1*  --   ALBUMIN 2.8* 2.9* 2.8*     Recent Labs Lab 11/15/15 0249 11/16/15 0250 11/17/15 0238 11/18/15 0549 11/19/15 1234  WBC 14.9* 13.3* 13.8* 11.8* 10.7*  HGB 8.8* 8.2* 8.1* 9.8* 10.2*  HCT 29.5* 27.5* 27.6* 35.5* 35.0*  MCV 76.2* 76.4* 79.3 81.6 82.0  PLT 209 235 190 194 186     Recent Labs Lab 11/14/15 1828  TROPONINI 0.64*   CBG:  Recent Labs Lab 11/20/15 1222 11/20/15 1717 11/20/15 2013 11/21/15 0006 11/21/15 0403  GLUCAP 193* 129* 235* 81 76    Studies/Results: No results found. Medications:   . calcium acetate  2,001 mg Oral TID WC  . darbepoetin (ARANESP) injection - DIALYSIS  200 mcg Intravenous Q Thu-HD  . doxercalciferol  8 mcg Intravenous Q T,Th,Sa-HD  . feeding supplement (NEPRO CARB STEADY)  237 mL Oral BID BM  . heparin  5,000 Units Subcutaneous 3 times per day  . hydrALAZINE  10 mg Intravenous Once  . insulin aspart  0-9  Units Subcutaneous 6 times per day  . lactulose  30 g Oral TID  . levETIRAcetam  500 mg Oral Q24H   And  . levETIRAcetam  250 mg Oral Once per day on Mon Wed Fri  . sodium chloride flush  3 mL Intravenous Q12H

## 2015-11-21 NOTE — Progress Notes (Signed)
CSW spoke with pt daughters Tonia Ghent and Synetta Fail who are in agreement for SNF with palliative following- provided daughters with choices- they choose [facility].  [facility] is able to admit patient today.  Patient will discharge to [facility] Anticipated discharge date: 2/13 Family notified: pt daughter- Luevenia Maxin by [employer]- scheduled for 4pm  CSW signing off.  Merlyn Lot, LCSWA Clinical Social Worker [phone]

## 2015-11-21 NOTE — Discharge Summary (Signed)
Physician Discharge Summary  Jane Higgins ZOX:[id number] DOB: April 04, 1956 DOA: 11/13/2015  PCP: No primary care provider on file.  Admit date: 11/13/2015 Discharge date: 11/21/2015  Time spent: > 30 minutes  Recommendations for Outpatient Follow-up:  1. Follow up with HD as scheduled tomorrow   Discharge Diagnoses:  Principal Problem:   Uremic encephalopathy Active Problems:   Hyperglycemia   Schizophrenia (HCC)   DM2 (diabetes mellitus, type 2) (HCC)   Anemia   ESRD needing dialysis (HCC)   SIRS (systemic inflammatory response syndrome) (HCC)   Convulsions (HCC)   Altered level of consciousness   Palliative care encounter   DNR (do not resuscitate)   Weakness generalized   Discharge Condition: stable  Diet recommendation: renal  Filed Weights   11/19/15 0639 11/19/15 1016 11/20/15 0408  Weight: 51.8 kg (114 lb 3.2 oz) 50.8 kg (111 lb 15.9 oz) 51.2 kg (112 lb 14 oz)    History of present illness:  See H&P, Labs, Consult and Test reports for all details in brief, patient is a 60 year old female with end-stage renal disease, diabetes, hypertension, bipolar disorder, is being brought to the hospital by her son as patient has refused to have dialysis done for the last couple weeks and she has been progressively more altered, especially in the last 24 hours. Patient has expressed her wishes in the past not to undergo dialysis, however currently she is not able to make any medical decisions and the son is the power of attorney  Hospital Course:  Uremic encephalopathy in the setting of ESRD - Shortly after admission, patient's mental status significantly deteriorated requiring ICU admission and intubation with mechanical ventilation. She was successfully extubated on 2/9, transferred to the [facility] service on 2/10 on room air and has been stable since - Nephrology was consulted and are following, patient underwent hemodialysis with improvement in her mental status. Next HD scheduled  for 2/14 - I do not think however that she has capacity to make medical decisions, as she cannot tell me the reason why she is in the hospital, she denies refusing hemodialysis and has no recollection of recent events. She cannot tell me the consequences of her actions in terms of medical outcome - very deconditioned, PT recommending SNF Goals of care - Palliative care has been consulted and are following, ongoing discussions regarding patient's wishes versus family wishes. Family feels like patient has underlying bipolar disorder and cannot make significant medical decisions for herself, they acknowledged the fact that she doesn't want dialysis however there views are different. Appreciated palliative care input SIRS/ Leukocytosis - Suspected? Aspiration, she was on Unasyn 2/08 >> 2/09 and Vancomycin / Zosyn 2/5 >> 2/7 - Monitor off antibiotics, chest x-ray clear, she is on room air, leukocytosis improving cultures have remained negative. In total she has received 5 days of IV antibiotics - afebrile and stable now Acute hypoxic respiratory failure / ventilator dependent respiratory failure - Extubated on 2/9, chest x-ray clear  - on room air now Diabetes - Home regimen includes Novolin 70/30. Schizophrenia/bipolar Possible seizures - Neurology was consulted, appreciate input, continue Keppra at 500 mg daily with additional 250 mg post-each hemodialysis Anemia of chronic disease - Hemoglobin stable, no bleeding Hypokalemia - Intermittent, replete as needed, per HD  Procedures:  EEG  Intubation   Consultations:  PCCM  Neurology  Nephrology  Palliative care  Discharge Exam: Filed Vitals:   11/20/15 2158 11/21/15 0408 11/21/15 0422 11/21/15 0506  BP: 160/73 169/82 179/84 139/59  Pulse:  89 89 79 91  Temp: 98.6 F (37 C) 97.7 F (36.5 C)    TempSrc: Oral Oral    Resp: 16 17    Height:      Weight:      SpO2: 97% 98%      General: NAD Cardiovascular: RRR Respiratory:  CTA biL  Discharge Instructions Activity:  As tolerated   Get Medicines reviewed and adjusted: Please take all your medications with you for your next visit with your Primary MD  Please request your Primary MD to go over all hospital tests and procedure/radiological results at the follow up, please ask your Primary MD to get all Hospital records sent to his/her office.  If you experience worsening of your admission symptoms, develop shortness of breath, life threatening emergency, suicidal or homicidal thoughts you must seek medical attention immediately by calling 911 or calling your MD immediately if symptoms less severe.  You must read complete instructions/literature along with all the possible adverse reactions/side effects for all the Medicines you take and that have been prescribed to you. Take any new Medicines after you have completely understood and accpet all the possible adverse reactions/side effects.   Do not drive when taking Pain medications.   Do not take more than prescribed Pain, Sleep and Anxiety Medications  Special Instructions: If you have smoked or chewed Tobacco in the last 2 yrs please stop smoking, stop any regular Alcohol and or any Recreational drug use.  Wear Seat belts while driving.  Please note  You were cared for by a hospitalist during your hospital stay. Once you are discharged, your primary care physician will handle any further medical issues. Please note that NO REFILLS for any discharge medications will be authorized once you are discharged, as it is imperative that you return to your primary care physician (or establish a relationship with a primary care physician if you do not have one) for your aftercare needs so that they can reassess your need for medications and monitor your lab values.    Medication List    TAKE these medications        amLODipine 5 MG tablet  Commonly known as:  NORVASC  Take 1 tablet (5 mg total) by mouth daily.       calcium acetate 667 MG capsule  Commonly known as:  PHOSLO  Take 3 capsules (2,001 mg total) by mouth 3 (three) times daily with meals.     feeding supplement (NEPRO CARB STEADY) Liqd  Take 237 mLs by mouth 2 (two) times daily between meals.     insulin NPH-regular Human (70-30) 100 UNIT/ML injection  Commonly known as:  NOVOLIN 70/30  Inject 5 Units into the skin 2 (two) times daily with a meal.     levETIRAcetam 500 MG tablet  Commonly known as:  KEPPRA  Take 1 tablet (500 mg total) by mouth daily.     levETIRAcetam 250 MG tablet  Commonly known as:  KEPPRA  25 mg after each HD session     metoprolol tartrate 25 MG tablet  Commonly known as:  LOPRESSOR  Take 1 tablet (25 mg total) by mouth 2 (two) times daily.     multivitamin Tabs tablet  Take 1 tablet by mouth at bedtime.         The results of significant diagnostics from this hospitalization (including imaging, microbiology, ancillary and laboratory) are listed below for reference.    Significant Diagnostic Studies: Dg Chest 2 View  11/13/2015  CLINICAL  DATA:  Left-sided weakness.  Dialysis patient. EXAM: CHEST  2 VIEW COMPARISON:  10/04/2015 FINDINGS: Right jugular central venous catheter tip in the right atrium is unchanged. Negative for edema. Lungs are clear without infiltrate or effusion. Cardiac enlargement unchanged. IMPRESSION: No active cardiopulmonary disease. Electronically Signed   By: Marlan Palau M.D.   On: 11/13/2015 13:50   Ct Head Wo Contrast  11/14/2015  CLINICAL DATA:  Acute onset of altered mental status. Initial encounter. EXAM: CT HEAD WITHOUT CONTRAST TECHNIQUE: Contiguous axial images were obtained from the base of the skull through the vertex without intravenous contrast. COMPARISON:  CT of the head performed earlier today at 1:48 p.m. FINDINGS: There is no evidence of acute infarction, mass lesion, or intra- or extra-axial hemorrhage on CT. A chronic right posterior parietal infarct is again  noted, with associated encephalomalacia. Mild periventricular white matter change likely reflects small vessel ischemic microangiopathy. The brainstem and fourth ventricle are within normal limits. The basal ganglia are unremarkable in appearance. The cerebral hemispheres demonstrate grossly normal gray-white differentiation. No mass effect or midline shift is seen. There is no evidence of fracture; visualized osseous structures are unremarkable in appearance. The orbits are within normal limits. The paranasal sinuses and mastoid air cells are well-aerated. No significant soft tissue abnormalities are seen. IMPRESSION: 1. No acute intracranial pathology seen on CT. 2. Chronic right posterior parietal infarct again noted, with associated encephalomalacia. 3. Mild small vessel ischemic microangiopathy. Electronically Signed   By: Roanna Raider M.D.   On: 11/14/2015 00:01   Ct Head Wo Contrast  11/13/2015  CLINICAL DATA:  SIGNIFICANT LEFT SIDED WEAKNESS X 2 DAYS SLURRED SPEECH PMH: DM, HTN, Bipolar 1 disorder (HCC); ESRD EXAM: CT HEAD WITHOUT CONTRAST TECHNIQUE: Contiguous axial images were obtained from the base of the skull through the vertex without intravenous contrast. COMPARISON:  10/04/2015 FINDINGS: Remote right parietal infarct again noted no associated hemorrhage. No evidence for acute extension of the infarct. There is no intra or extra-axial fluid collection or mass lesion. The basilar cisterns and ventricles have a normal appearance. There is no CT evidence for acute infarction or hemorrhage. Bone windows show significant atherosclerotic calcification of the internal carotid arteries. No acute calvarial injury. Mild mucoperiosteal thickening of the ethmoid air cells. IMPRESSION: 1. 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 2.  No evidence for acute intracranial abnormality. 3. Mild sinusitis. Electronically Signed   By: Norva Pavlov M.D.   On: 11/13/2015 14:24   Mr Brain Wo  Contrast  11/14/2015  CLINICAL DATA:  Progressively worsening mental status of the last few days. Patient with renal failure, diabetes and hypertension. EXAM: MRI HEAD WITHOUT CONTRAST TECHNIQUE: Multiplanar, multiecho pulse sequences of the brain and surrounding structures were obtained without intravenous contrast. COMPARISON:  CT 11/13/2015.  MRI 04/14/2013. FINDINGS: Diffusion imaging does not show any acute or subacute infarction. There are old small vessel infarctions within the left pons. No focal cerebellar abnormality. There is an old cortical and subcortical infarction in the right parietal lobe with atrophy, encephalomalacia and adjacent gliosis. No other sign of stroke. Focus of susceptibility artifact in the right temporal lobe is again seen, unchanged since 2014, and likely relating to a developmental venous anomaly such as a cavernoma or venous angioma. No evidence of mass lesion, acute hemorrhage, hydrocephalus or extra-axial collection. No pituitary mass. No inflammatory sinus disease. No skull or skullbase lesion. IMPRESSION: No acute or reversible finding. 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 Old small vessel infarctions left  pons. Chronic focus of susceptibility artifact in the right temporal lobe likely to represent either a cavernoma or venous angioma. Electronically Signed   By: Paulina Fusi M.D.   On: 11/14/2015 16:21   Dg Chest Port 1 View  11/17/2015  CLINICAL DATA:  60 year old female with renal failure, SIRS (systemic inflammatory response syndrome). Recently intubated. Initial encounter. EXAM: PORTABLE CHEST 1 VIEW COMPARISON:  11/14/2015 and earlier. FINDINGS: Portable AP semi upright view at 0457 hours. Extubated. Enteric tube removed. Mildly improved lung volumes. Stable cardiomegaly and mediastinal contours. Stable right IJ approach dialysis type catheter. No pneumothorax, pulmonary edema, pleural effusion or confluent pulmonary opacity. IMPRESSION: 1. Extubated  and enteric tube removed. 2.  No acute cardiopulmonary abnormality. Electronically Signed   By: Odessa Fleming M.D.   On: 11/17/2015 06:49   Dg Chest Port 1 View  11/14/2015  CLINICAL DATA:  Intubation.  Hypertension. EXAM: PORTABLE CHEST 1 VIEW COMPARISON:  11/14/2015 earlier today at 1031 hours. FINDINGS: Dual lumen dialysis catheter unchanged in position. Endotracheal tube is been inserted and lies 7.1 cm above the carina. Lung fields remain clear. Stable cardiomegaly. No pneumothorax or pleural effusion. IMPRESSION: ET tube lies 7.1 cm above carina. If desired, the tube could be advanced 2-3 cm. No active cardiopulmonary disease. Electronically Signed   By: Elsie Stain M.D.   On: 11/14/2015 23:10   Dg Chest Port 1 View  11/14/2015  CLINICAL DATA:  Aspiration into airway. EXAM: PORTABLE CHEST 1 VIEW COMPARISON:  November 13, 2015. FINDINGS: Stable cardiomegaly. No pneumothorax or pleural effusion is noted. No acute pulmonary disease is noted. Nasogastric tube tip is seen in proximal stomach. Right internal jugular dialysis catheter is unchanged in position. Bony thorax is unremarkable. IMPRESSION: No acute cardiopulmonary abnormality seen. Electronically Signed   By: Lupita Raider, M.D.   On: 11/14/2015 11:39   Dg Abd Portable 1v  11/15/2015  CLINICAL DATA:  Status post NG placement EXAM: PORTABLE ABDOMEN - 1 VIEW COMPARISON:  11/14/2015 FINDINGS: A nasogastric catheter is noted coiled within the stomach. The tip is directed towards the gastric fundus. Scattered large and small bowel gas is noted. Calcifications consistent with chronic pancreatitis are noted. No free air is seen. IMPRESSION: Nasogastric catheter within the stomach. Electronically Signed   By: Alcide Clever M.D.   On: 11/15/2015 11:55   Dg Abd Portable 1v  11/14/2015  CLINICAL DATA:  NG placement EXAM: PORTABLE ABDOMEN - 1 VIEW COMPARISON:  11/13/2014 FINDINGS: NG coiled in the stomach with the tip near the fundus. Normal bowel gas pattern.   Calcified uterine fibroid on the left. IMPRESSION: NG tube coiled in the stomach.  Negative for bowel obstruction. Electronically Signed   By: Marlan Palau M.D.   On: 11/14/2015 13:06   Dg Abd Portable 1v  11/14/2015  CLINICAL DATA:  Nasogastric tube placement.  Initial encounter. EXAM: PORTABLE ABDOMEN - 1 VIEW COMPARISON:  Abdominal radiograph performed 05/28/2015 FINDINGS: The patient's enteric tube is seen coiling within the body of the stomach. The visualized bowel gas pattern is unremarkable. Scattered air and stool filled loops of colon are seen; no abnormal dilatation of small bowel loops is seen to suggest small bowel obstruction. No free intra-abdominal air is identified, though evaluation for free air is limited on a single supine view. The visualized osseous structures are within normal limits; the sacroiliac joints are unremarkable in appearance. The visualized lung bases are essentially clear. Scattered calcifications are seen within the pelvis. IMPRESSION: Enteric tube seen coiling  within the body of the stomach. Electronically Signed   By: Roanna Raider M.D.   On: 11/14/2015 01:05    Microbiology: Recent Results (from the past 240 hour(s))  Culture, blood (routine x 2)     Status: None   Collection Time: 11/13/15  3:09 PM  Result Value Ref Range Status   Specimen Description BLOOD RIGHT ARM  Final   Special Requests BOTTLES DRAWN AEROBIC AND ANAEROBIC 5CC  Final   Culture NO GROWTH 5 DAYS  Final   Report Status 11/18/2015 FINAL  Final  Culture, blood (routine x 2)     Status: None   Collection Time: 11/13/15  3:59 PM  Result Value Ref Range Status   Specimen Description BLOOD RIGHT HAND  Final   Special Requests BOTTLES DRAWN AEROBIC AND ANAEROBIC 5CC  Final   Culture NO GROWTH 5 DAYS  Final   Report Status 11/18/2015 FINAL  Final  MRSA PCR Screening     Status: None   Collection Time: 11/13/15  9:09 PM  Result Value Ref Range Status   MRSA by PCR NEGATIVE NEGATIVE Final     Comment:        The GeneXpert MRSA Assay (FDA approved for NASAL specimens only), is one component of a comprehensive MRSA colonization surveillance program. It is not intended to diagnose MRSA infection nor to guide or monitor treatment for MRSA infections.   Culture, blood (Routine X 2) w Reflex to ID Panel     Status: None   Collection Time: 11/14/15 12:24 AM  Result Value Ref Range Status   Specimen Description BLOOD LEFT ANTECUBITAL  Final   Special Requests IN PEDIATRIC BOTTLE 3CC  Final   Culture NO GROWTH 5 DAYS  Final   Report Status 11/19/2015 FINAL  Final  Culture, blood (Routine X 2) w Reflex to ID Panel     Status: None   Collection Time: 11/14/15 12:40 AM  Result Value Ref Range Status   Specimen Description BLOOD LEFT HAND  Final   Special Requests IN PEDIATRIC BOTTLE [facility]  Final   Culture NO GROWTH 5 DAYS  Final   Report Status 11/19/2015 FINAL  Final     Labs: Basic Metabolic Panel:  Recent Labs Lab 11/14/15 1915 11/14/15 2003 11/15/15 0249 11/15/15 1052 11/15/15 2130 11/16/15 0250 11/17/15 0238 11/18/15 0549 11/19/15 0659 11/19/15 1234 11/21/15 0539  NA  --  140 138  --   --  142 141 140 137 139 138  K  --  3.4* 3.4*  --   --  2.9* 3.5 3.2* 4.0 3.8 4.3  CL  --  98* 100*  --   --  103 106 99* 99* 99* 100*  CO2  --  21* 21*  --   --  24 23 26 25 30  21*  GLUCOSE  --  105* 67  --   --  120* 143* 201* 162* 202* 151*  BUN  --  95* 25*  --   --  27* 38* 14 27* 7 31*  CREATININE  --  11.30* 4.29*  --   --  4.32* 5.87* 3.54* 4.78* 2.19* 4.98*  CALCIUM  --  7.1* 7.2*  --   --  7.7* 7.8* 8.3* 8.3* 8.1* 8.8*  MG 1.9  --   --  1.7 1.7  --   --   --   --   --   --   PHOS 11.9* 11.9* 3.9 4.3 2.9 2.9 3.7 2.7 4.1  --  5.0*   Liver Function Tests:  Recent Labs Lab 11/17/15 0238 11/18/15 0549 11/19/15 0659 11/19/15 1234 11/21/15 0539  AST  --   --   --  27  --   ALT  --   --   --  22  --   ALKPHOS  --   --   --  81  --   BILITOT  --   --   --  0.5  --    PROT  --   --   --  6.1*  --   ALBUMIN 2.6* 2.7* 2.8* 2.9* 2.8*   No results for input(s): LIPASE, AMYLASE in the last 168 hours. No results for input(s): AMMONIA in the last 168 hours. CBC:  Recent Labs Lab 11/15/15 0249 11/16/15 0250 11/17/15 0238 11/18/15 0549 11/19/15 1234  WBC 14.9* 13.3* 13.8* 11.8* 10.7*  HGB 8.8* 8.2* 8.1* 9.8* 10.2*  HCT 29.5* 27.5* 27.6* 35.5* 35.0*  MCV 76.2* 76.4* 79.3 81.6 82.0  PLT 209 235 190 194 186   Cardiac Enzymes:  Recent Labs Lab 11/14/15 1828  TROPONINI 0.64*   CBG:  Recent Labs Lab 11/20/15 1717 11/20/15 2013 11/21/15 0006 11/21/15 0403 11/21/15 1224  GLUCAP 129* 235* 81 76 118*       Signed:  Carle Fenech  [facility] 11/21/2015, 2:47 PM

## 2015-11-21 NOTE — Clinical Social Work Placement (Signed)
   CLINICAL SOCIAL WORK PLACEMENT  NOTE  Date:  11/21/2015  Patient Details  Name: Jane Higgins MRN: [id number] Date of Birth: Jan 13, 1956  Clinical Social Work is seeking post-discharge placement for this patient at the Skilled  Nursing Facility level of care (*CSW will initial, date and re-position this form in  chart as items are completed):  Yes   Patient/family provided with [facility] Clinical Social Work Department's list of facilities offering this level of care within the geographic area requested by the patient (or if unable, by the patient's family).  Yes   Patient/family informed of their freedom to choose among providers that offer the needed level of care, that participate in Medicare, Medicaid or managed care program needed by the patient, have an available bed and are willing to accept the patient.  Yes   Patient/family informed of [facility]'s ownership interest in [facility] [place] and [facility], as well as of the fact that they are under no obligation to receive care at these facilities.  PASRR submitted to EDS on 11/21/15     PASRR number received on 11/21/15     Existing PASRR number confirmed on       FL2 transmitted to all facilities in geographic area requested by pt/family on 11/21/15     FL2 transmitted to all facilities within larger geographic area on       Patient informed that his/her managed care company has contracts with or will negotiate with certain facilities, including the following:        Yes   Patient/family informed of bed offers received.  Patient chooses bed at [facility]     Physician recommends and patient chooses bed at      Patient to be transferred to [facility] on 11/21/15.  Patient to be transferred to facility by ptar     Patient family notified on 11/21/15 of transfer.  Name of family member notified:  Synetta Fail     PHYSICIAN Please sign FL2, Please sign DNR     Additional Comment:     _______________________________________________ Izora Ribas, LCSW 11/21/2015, 2:44 PM

## 2015-11-21 NOTE — Progress Notes (Signed)
Lorain Childes discharged Skilled nursing facility per MD order.  Report called to receiving Nada Boozer, LPN at [facility].     Medication List    TAKE these medications        amLODipine 5 MG tablet  Commonly known as:  NORVASC  Take 1 tablet (5 mg total) by mouth daily.     calcium acetate 667 MG capsule  Commonly known as:  PHOSLO  Take 3 capsules (2,001 mg total) by mouth 3 (three) times daily with meals.     feeding supplement (NEPRO CARB STEADY) Liqd  Take 237 mLs by mouth 2 (two) times daily between meals.     insulin NPH-regular Human (70-30) 100 UNIT/ML injection  Commonly known as:  NOVOLIN 70/30  Inject 5 Units into the skin 2 (two) times daily with a meal.     levETIRAcetam 500 MG tablet  Commonly known as:  KEPPRA  Take 1 tablet (500 mg total) by mouth daily.     levETIRAcetam 250 MG tablet  Commonly known as:  KEPPRA  25 mg after each HD session     metoprolol tartrate 25 MG tablet  Commonly known as:  LOPRESSOR  Take 1 tablet (25 mg total) by mouth 2 (two) times daily.     multivitamin Tabs tablet  Take 1 tablet by mouth at bedtime.        Patients skin is clean, dry and intact, no evidence of skin break down. IV site discontinued and catheter remains intact. Site without signs and symptoms of complications. Dressing and pressure applied.  Patient transported on a stretcher by non emergent EMS,  no distress noted upon discharge.  Laural Benes, Kharon Hixon C 11/21/2015 6:59 PM2

## 2015-11-21 NOTE — Care Management Important Message (Signed)
Important Message  Patient Details  Name: KENIJAH BENNINGFIELD MRN: [id number] Date of Birth: 1956/07/22   Medicare Important Message Given:  Yes    Alter Moss P Kamoni Depree 11/21/2015, 2:13 PM

## 2015-11-21 NOTE — Care Management Note (Signed)
Case Management Note  Patient Details  Name: EMANIE BEHAN MRN: [id number] Date of Birth: 12/26/1955  Subjective/Objective:    Patient is for dc to snf today, CSW following.               Action/Plan:   Expected Discharge Date:                  Expected Discharge Plan:  Skilled Nursing Facility  In-House Referral:  Clinical Social Work  Discharge planning Services  CM Consult  Post Acute Care Choice:    Choice offered to:     DME Arranged:    DME Agency:     HH Arranged:    HH Agency:     Status of Service:  Completed, signed off  Medicare Important Message Given:  Yes Date Medicare IM Given:    Medicare IM give by:    Date Additional Medicare IM Given:    Additional Medicare Important Message give by:     If discussed at Long Length of Stay Meetings, dates discussed:    Additional Comments:  Leone Haven, RN 11/21/2015, 3:58 PM

## 2016-01-07 DEATH — deceased

## 2017-12-20 IMAGING — CT CT HEAD W/O CM
2 series · 15 of 30 positions shown, 17 images · non-contrast
Comparison: CT head 04/12/2013.  MR head 04/14/2013.

CLINICAL DATA: Frontal headache and blurred vision. Symptom
duration unspecified.

EXAM:
CT HEAD WITHOUT CONTRAST
TECHNIQUE: Contiguous axial images were obtained from the base of the skull
through the vertex without intravenous contrast.

[Series 2: head without · axial · non-contrast · 0.44mm/px · z∈[-193,-88]mm · 7 of 29 slices shown, 9 images]
[im 4/29  brain]
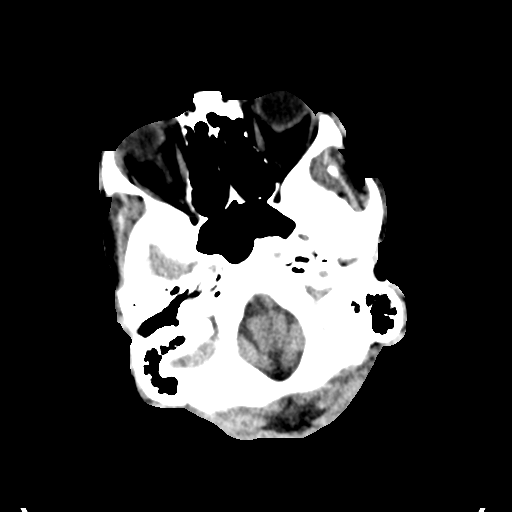
[im 4/29  bone]
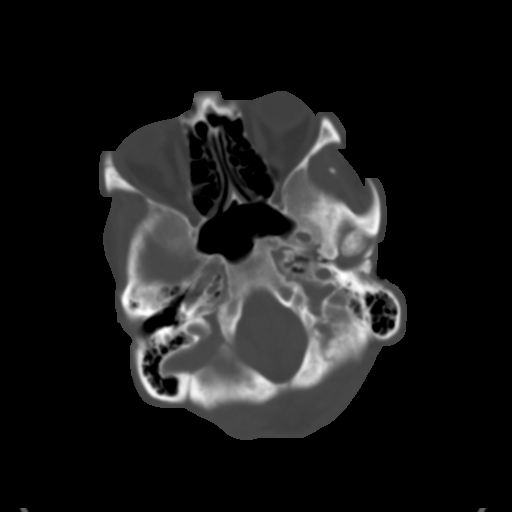
[im 8/29  brain]
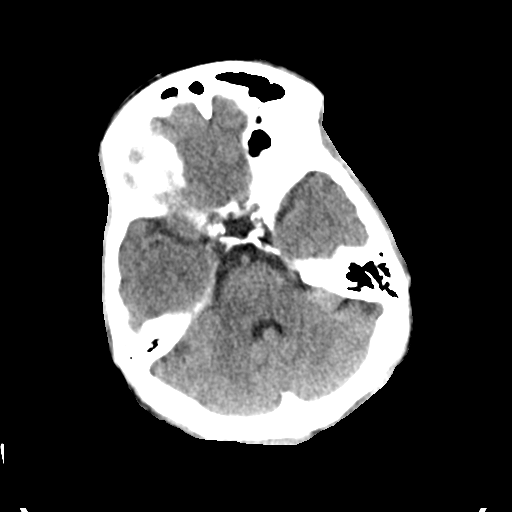
[im 11/29  brain]
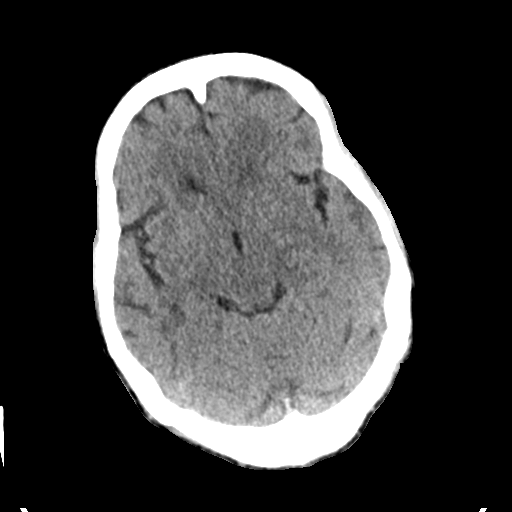
[im 15/29  brain]
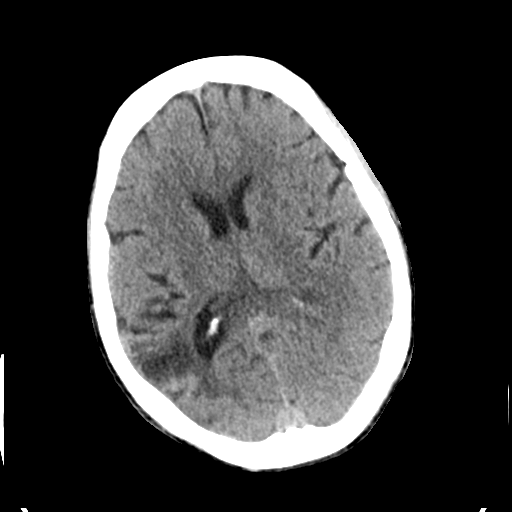
[im 18/29  brain]
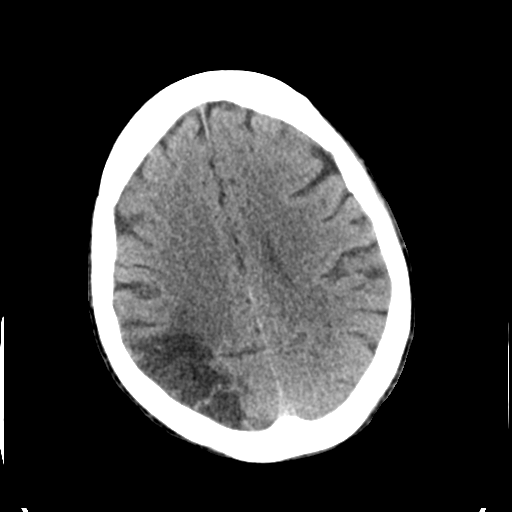
[im 18/29  bone]
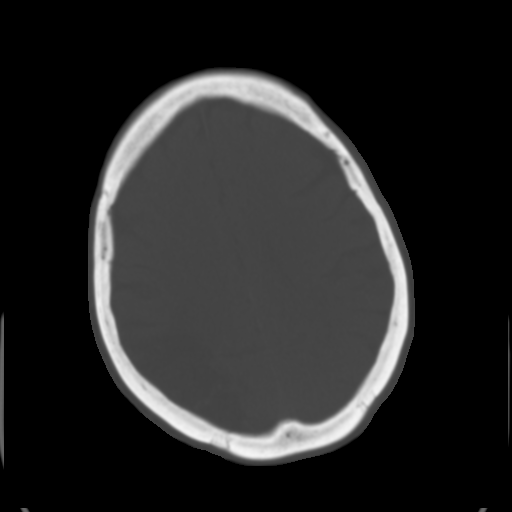
[im 22/29  brain]
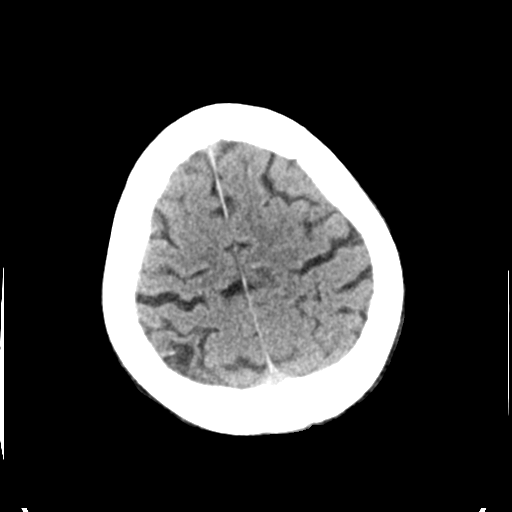
[im 25/29  brain]
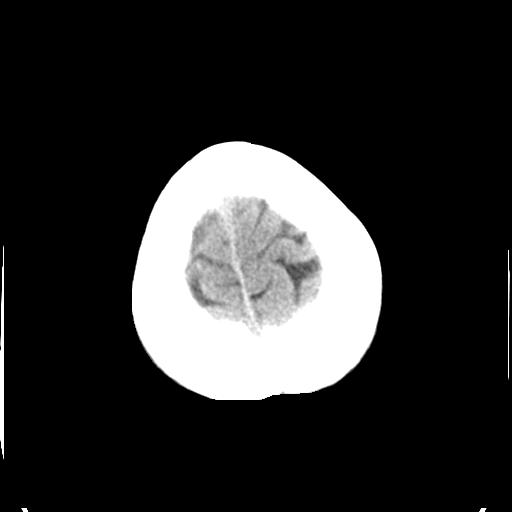

[Series 3: head bone · axial · 0.44mm/px · z∈[-194,-82]mm · 8 of 72 slices shown]
[im 8/72  bone]
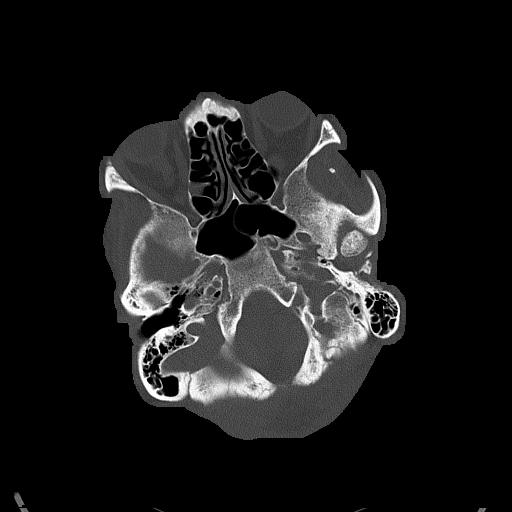
[im 15/72  bone]
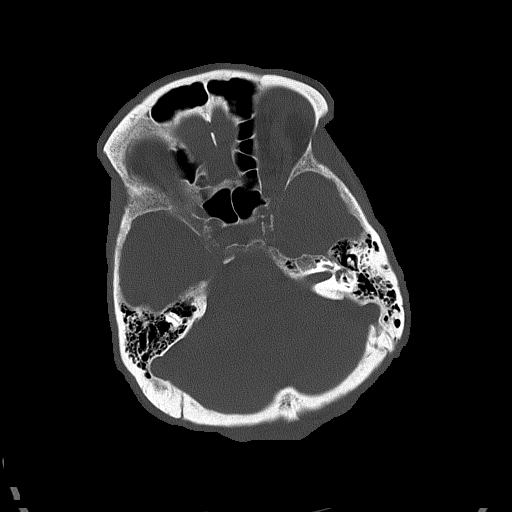
[im 22/72  bone]
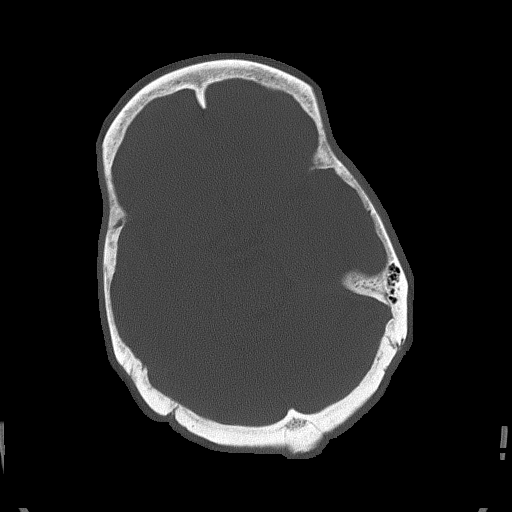
[im 32/72  bone]
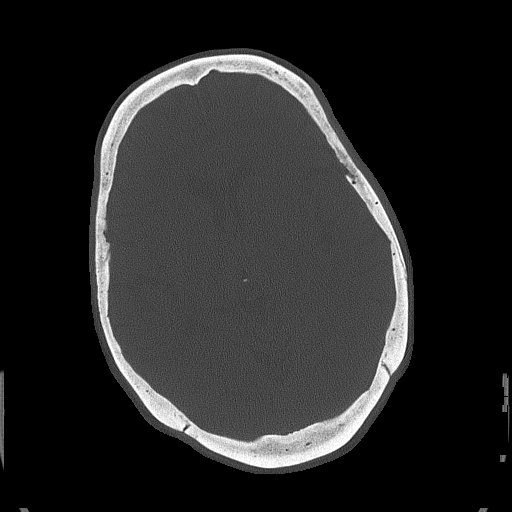
[im 40/72  bone]
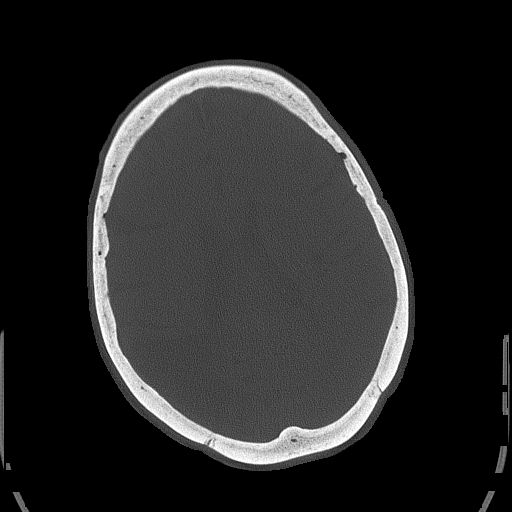
[im 50/72  bone]
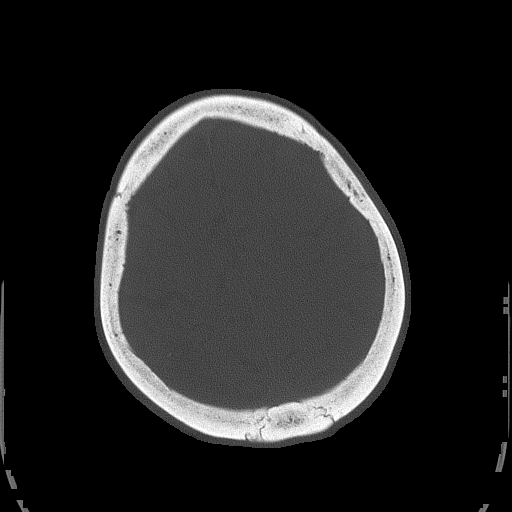
[im 57/72  bone]
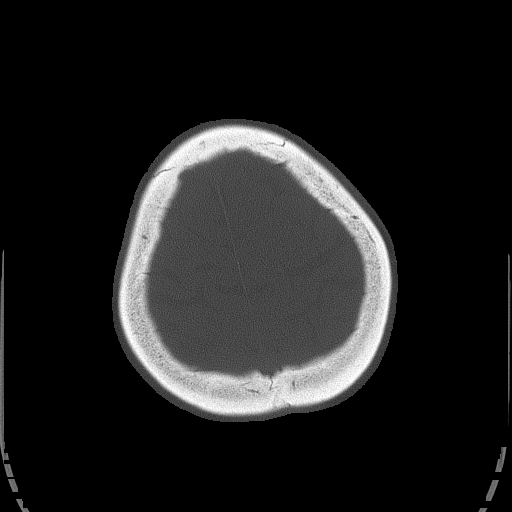
[im 64/72  bone]
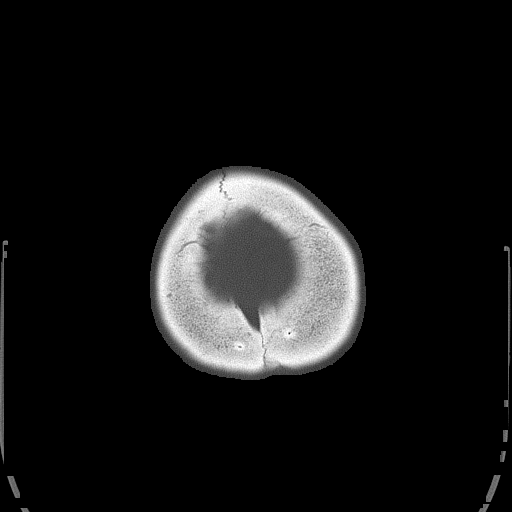

[15 of 30 positions shown; findings below may reference images not displayed]

FINDINGS: There is a large remote RIGHT MCA territory infarct affecting the
RIGHT parietal lobe. There is well-defined encephalomalacia and
compensatory enlargement of the atrium of the RIGHT lateral
ventricle.

No acute infarct, hemorrhage, mass lesion, hydrocephalus, or
extra-axial fluid. No apparent white matter disease. Calvarium
intact. No visible sinus or mastoid fluid. Advanced vascular
calcification in the carotid siphons. BILATERAL TMJ arthritis.

Compared with prior studies, this large infarct was not present.
IMPRESSION: Large remote RIGHT MCA territory infarct. No acute intracranial
findings.

## 2018-02-02 IMAGING — CR DG CHEST 1V PORT
1 series · 1 of 1 positions shown · non-contrast
Comparison: 11/14/2015 and earlier.

CLINICAL DATA: 59-year-old female with renal failure, SIRS
(systemic inflammatory response syndrome). Recently intubated.
Initial encounter.

EXAM:
PORTABLE CHEST 1 VIEW

[AP]
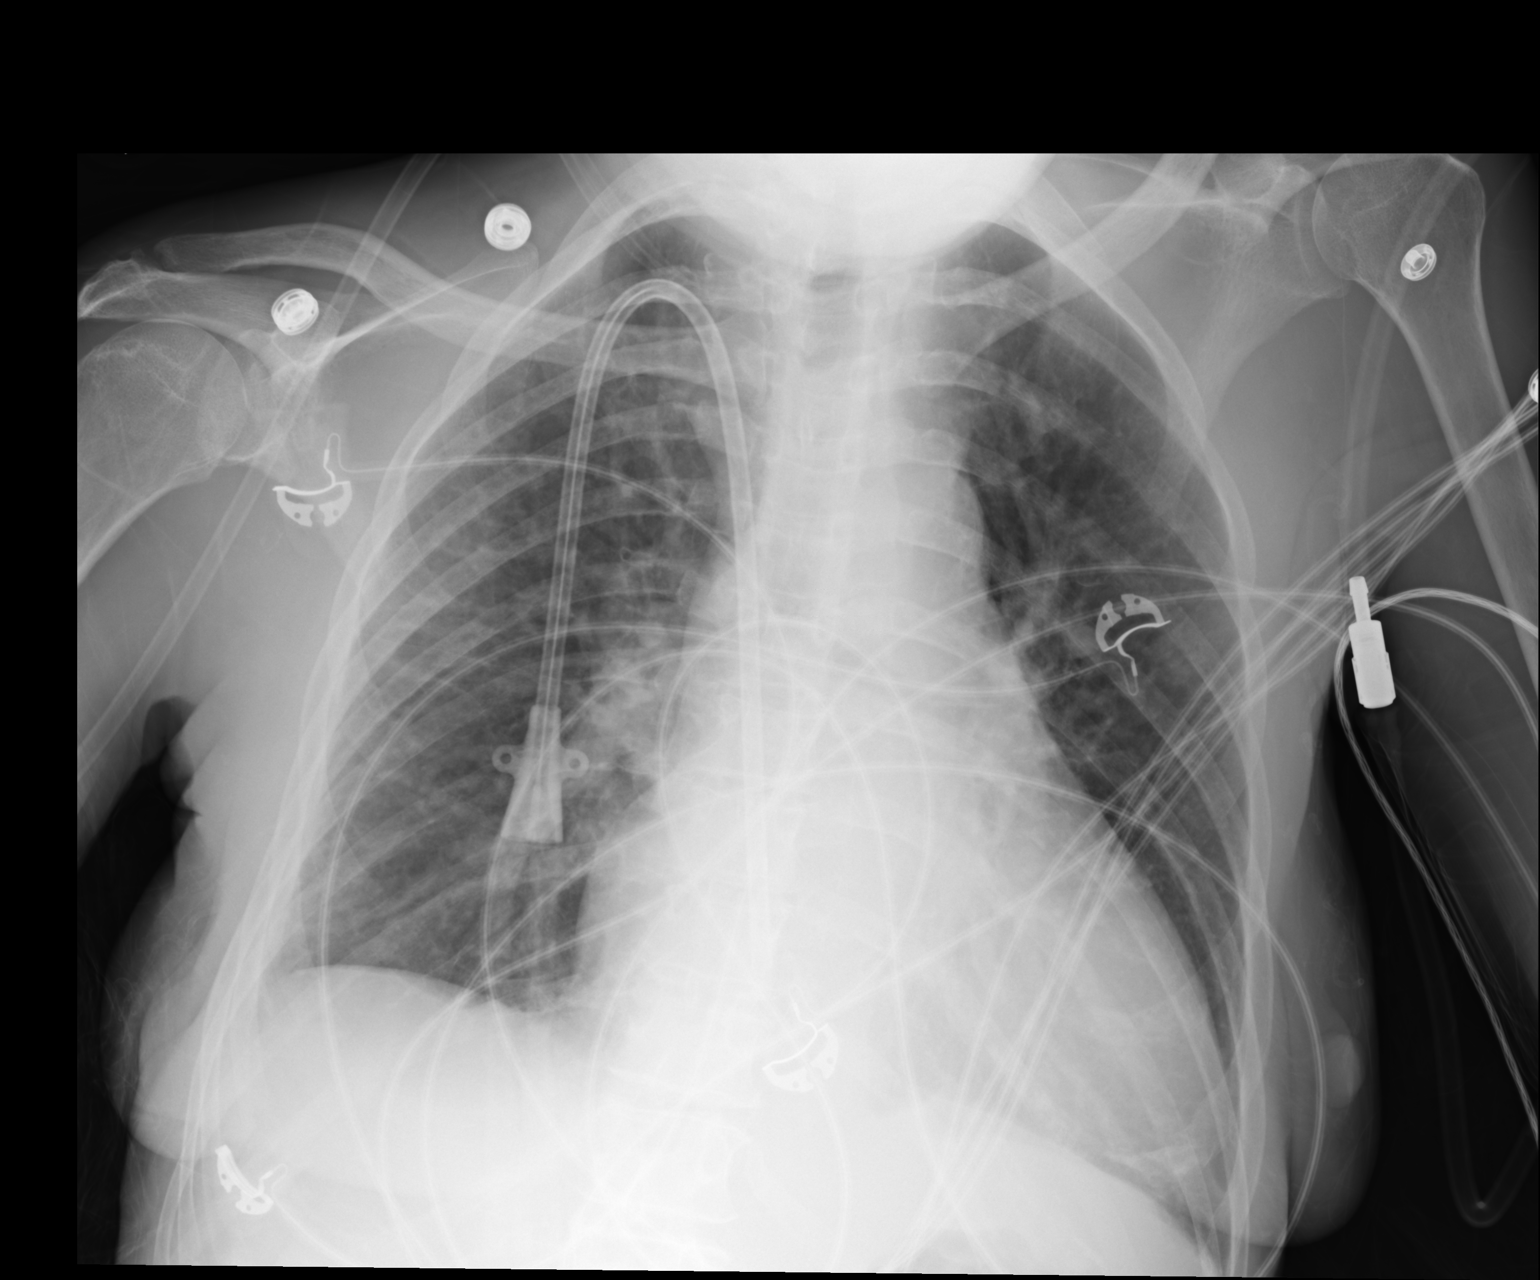

[1 of 1 positions shown; findings below may reference images not displayed]

FINDINGS: Portable AP semi upright view at 4422 hours. Extubated. Enteric tube
removed. Mildly improved lung volumes. Stable cardiomegaly and
mediastinal contours. Stable right IJ approach dialysis type
catheter. No pneumothorax, pulmonary edema, pleural effusion or
confluent pulmonary opacity.
IMPRESSION: 1. Extubated and enteric tube removed.
2.  No acute cardiopulmonary abnormality.
